# Patient Record
Sex: Female | Born: 1960 | Race: White | Hispanic: No | Marital: Married | Smoking: Never smoker
Health system: Southern US, Community
[De-identification: ages and names within clinical notes are randomized; demographics above are authoritative.]

## PROBLEM LIST (undated history)

## (undated) ENCOUNTER — Emergency Department (HOSPITAL_COMMUNITY): Payer: BLUE CROSS/BLUE SHIELD

## (undated) DIAGNOSIS — Z87442 Personal history of urinary calculi: Secondary | ICD-10-CM

## (undated) DIAGNOSIS — K7581 Nonalcoholic steatohepatitis (NASH): Secondary | ICD-10-CM

## (undated) DIAGNOSIS — G620 Drug-induced polyneuropathy: Secondary | ICD-10-CM

## (undated) DIAGNOSIS — Z800 Family history of malignant neoplasm of digestive organs: Secondary | ICD-10-CM

## (undated) DIAGNOSIS — Z1501 Genetic susceptibility to malignant neoplasm of breast: Secondary | ICD-10-CM

## (undated) DIAGNOSIS — G20C Parkinsonism, unspecified: Secondary | ICD-10-CM

## (undated) DIAGNOSIS — T451X5A Adverse effect of antineoplastic and immunosuppressive drugs, initial encounter: Secondary | ICD-10-CM

## (undated) DIAGNOSIS — I7300 Raynaud's syndrome without gangrene: Secondary | ICD-10-CM

## (undated) DIAGNOSIS — R011 Cardiac murmur, unspecified: Secondary | ICD-10-CM

## (undated) DIAGNOSIS — Z803 Family history of malignant neoplasm of breast: Secondary | ICD-10-CM

## (undated) DIAGNOSIS — C50919 Malignant neoplasm of unspecified site of unspecified female breast: Secondary | ICD-10-CM

## (undated) DIAGNOSIS — Z9889 Other specified postprocedural states: Secondary | ICD-10-CM

## (undated) DIAGNOSIS — K5090 Crohn's disease, unspecified, without complications: Secondary | ICD-10-CM

## (undated) DIAGNOSIS — E782 Mixed hyperlipidemia: Secondary | ICD-10-CM

## (undated) DIAGNOSIS — C4490 Unspecified malignant neoplasm of skin, unspecified: Secondary | ICD-10-CM

## (undated) DIAGNOSIS — Z923 Personal history of irradiation: Secondary | ICD-10-CM

## (undated) DIAGNOSIS — Z1509 Genetic susceptibility to other malignant neoplasm: Secondary | ICD-10-CM

## (undated) DIAGNOSIS — Z9221 Personal history of antineoplastic chemotherapy: Secondary | ICD-10-CM

## (undated) DIAGNOSIS — I10 Essential (primary) hypertension: Secondary | ICD-10-CM

## (undated) DIAGNOSIS — Z8042 Family history of malignant neoplasm of prostate: Secondary | ICD-10-CM

## (undated) DIAGNOSIS — C50911 Malignant neoplasm of unspecified site of right female breast: Secondary | ICD-10-CM

## (undated) DIAGNOSIS — Z8719 Personal history of other diseases of the digestive system: Secondary | ICD-10-CM

## (undated) HISTORY — DX: Family history of malignant neoplasm of breast: Z80.3

## (undated) HISTORY — DX: Family history of malignant neoplasm of digestive organs: Z80.0

## (undated) HISTORY — DX: Family history of malignant neoplasm of prostate: Z80.42

## (undated) HISTORY — PX: ABDOMINAL HYSTERECTOMY: SHX81

## (undated) HISTORY — DX: Malignant neoplasm of unspecified site of unspecified female breast: C50.919

## (undated) HISTORY — PX: OTHER SURGICAL HISTORY: SHX169

## (undated) HISTORY — DX: Malignant neoplasm of unspecified site of right female breast: C50.911

## (undated) HISTORY — PX: TONSILLECTOMY: SUR1361

## (undated) HISTORY — DX: Personal history of other diseases of the digestive system: Z87.19

## (undated) HISTORY — DX: Essential (primary) hypertension: I10

## (undated) NOTE — *Deleted (*Deleted)
Patient on schedule for Liver Biopsy 11/05/2019. Spoke with patient on phone. Made aware to be here @ 0930, NPO after MN.and to have driver for discharge post procedure.

---
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
## 2011-01-11 DIAGNOSIS — Z9221 Personal history of antineoplastic chemotherapy: Secondary | ICD-10-CM

## 2011-01-11 DIAGNOSIS — Z923 Personal history of irradiation: Secondary | ICD-10-CM

## 2011-01-11 HISTORY — DX: Personal history of irradiation: Z92.3

## 2011-01-11 HISTORY — DX: Personal history of antineoplastic chemotherapy: Z92.21

## 2011-01-11 HISTORY — PX: BREAST BIOPSY: SHX20

## 2011-01-11 HISTORY — PX: BREAST LUMPECTOMY: SHX2

## 2011-06-08 DIAGNOSIS — C50911 Malignant neoplasm of unspecified site of right female breast: Secondary | ICD-10-CM

## 2011-06-08 HISTORY — DX: Malignant neoplasm of unspecified site of right female breast: C50.911

## 2013-02-10 HISTORY — PX: REDUCTION MAMMAPLASTY: SUR839

## 2014-08-27 DIAGNOSIS — I10 Essential (primary) hypertension: Secondary | ICD-10-CM | POA: Insufficient documentation

## 2014-08-27 DIAGNOSIS — R42 Dizziness and giddiness: Secondary | ICD-10-CM | POA: Insufficient documentation

## 2015-07-15 ENCOUNTER — Encounter: Payer: Self-pay | Admitting: *Deleted

## 2015-07-15 ENCOUNTER — Other Ambulatory Visit: Payer: Self-pay | Admitting: Obstetrics & Gynecology

## 2015-07-15 DIAGNOSIS — Z853 Personal history of malignant neoplasm of breast: Secondary | ICD-10-CM

## 2015-07-21 ENCOUNTER — Inpatient Hospital Stay
Admission: RE | Admit: 2015-07-21 | Discharge: 2015-07-21 | Disposition: A | Discharge status: Home / Self Care | Payer: Self-pay | Source: Ambulatory Visit | Attending: *Deleted | Admitting: *Deleted

## 2015-07-21 ENCOUNTER — Other Ambulatory Visit: Payer: Self-pay | Admitting: *Deleted

## 2015-07-21 DIAGNOSIS — Z9289 Personal history of other medical treatment: Secondary | ICD-10-CM

## 2015-07-23 ENCOUNTER — Other Ambulatory Visit: Payer: Self-pay | Admitting: Obstetrics & Gynecology

## 2015-07-23 DIAGNOSIS — Z853 Personal history of malignant neoplasm of breast: Secondary | ICD-10-CM

## 2015-07-27 ENCOUNTER — Ambulatory Visit: Payer: Self-pay | Admitting: General Surgery

## 2015-08-03 ENCOUNTER — Ambulatory Visit: Payer: BLUE CROSS/BLUE SHIELD

## 2015-08-03 ENCOUNTER — Ambulatory Visit
Admission: RE | Admit: 2015-08-03 | Discharge: 2015-08-03 | Disposition: A | Discharge status: Home / Self Care | Payer: BLUE CROSS/BLUE SHIELD | Source: Ambulatory Visit | Attending: Obstetrics & Gynecology | Admitting: Obstetrics & Gynecology

## 2015-08-03 DIAGNOSIS — Z853 Personal history of malignant neoplasm of breast: Secondary | ICD-10-CM | POA: Insufficient documentation

## 2015-08-04 ENCOUNTER — Ambulatory Visit: Payer: Self-pay | Admitting: General Surgery

## 2015-08-10 ENCOUNTER — Other Ambulatory Visit: Payer: Self-pay | Admitting: Hematology and Oncology

## 2015-08-10 NOTE — Progress Notes (Signed)
[facility] day:  08/11/15  Chief Complaint: Kelly Munoz is a 55 y.o. female with stage IIB Her2/neu + right breast cancer who is referred by Dr. Aneta Mins for new patient assessment.  HPI:  The patient felt a lump in her right breast on 05/03/2011.  Mammogram on 05/11/2011 at [facility] in [place], [place], revealed irregular masses in the right breast and a 1.4 cm thickened right axillary lymph node.    On 06/08/2011, she underwent right partial mastectomy and sentinel lymph node biopsy.  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  There was solid, cribriform and comedo DCIS with necrosis.  Three sentinel lymph nodes were collected.  One of 5 lymph nodes were positive.  There was a macro-metastasis in one intramammary lymph node.  Tumor was ER positive (88.8%), PR positive (90.93%), and Her2/neu 3+.  Ki-67 was 48%.   Pathologic stage was T2N1aM0.  The superior margin was positive necessitating re-excision on a second surgery.  The patient received 6 cycles of TCH chemotherapy beginning 07/11/2011.  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  She completed 1 year of adjuvant Herceptin given every 3 weeks.  At one point, her EF decreased (no records available).  She was followed by a cardiology oncologist.  EF returned to normal per patient report.  Last echo was in 2014.  She received radiation.  Radiation completed before the end of 2013.  She began tamoxifen in 01/2012.  Labs from 04/08/2014 revealed a premenopausal status with estradiol < 5 and FSH of 16.5.  CEA was 1.7 on 07/27/2015.  CA125 was 16 on 07/27/2015.  CA27.29 was 13.2 on 03/04/2015.  CBC with diff and CMP on 07/13/2015 were normal.  Mammogram on 08/03/2015 revealed stable post lumpectomy changes in the right breast.  She has not undergone genetic testing secondary to insurance coverage.  Symptomatically,  she notes some hot flashes.  She describes chemo brain.  She has some word findings issues at times.  She continues to have a neuropathy in her legs.   Past Medical History:  Diagnosis Date  . Breast cancer ([place])   . History of IBS   . Hypertension     Past Surgical History:  Procedure Laterality Date  . BREAST BIOPSY Right 2013   lumpectomy chem and rad  . parotid stone      Removal  . REDUCTION MAMMAPLASTY Bilateral 02/2013    Family History  Problem Relation Age of Onset  . Breast cancer Mother 74  . Kidney cancer Father   . Prostate cancer Maternal Uncle   . Stomach cancer Paternal Aunt     Social History:  reports that she has never smoked. She has never used smokeless tobacco. She reports that she does not drink alcohol or use drugs.  She is from [place].  She moved to [place] at the end of 03/2015 to be with her family.  Her parents live in [place].  She lives with her husband.  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  She has 2 daughters (one in graduate school, one in [place]).  The patient is alone today.  Allergies:  Allergies  Allergen Reactions  . Zithromax [Azithromycin] Diarrhea  . Clindamycin/Lincomycin Rash  . Erythromycin Rash    Current Medications: Current Outpatient Prescriptions  Medication Sig Dispense Refill  . cholecalciferol (VITAMIN D)  1000 units tablet Take 2,000 Units by mouth daily.    Marland Kitchen LIALDA 1.2 g EC tablet     . pravastatin (PRAVACHOL) 20 MG tablet     . ramipril (ALTACE) 10 MG capsule     . tamoxifen (NOLVADEX) 20 MG tablet      No current facility-administered medications for this visit.     Review of Systems:  GENERAL:  Feels good.  Active.  No fevers, sweats or weight loss.  Weight gain. PERFORMANCE STATUS (ECOG):  1 HEENT:  No visual changes, runny nose, sore throat, mouth sores or tenderness. Lungs: No shortness of breath or cough.  No hemoptysis. Cardiac:  No  chest pain, palpitations, orthopnea, or PND. GI:  Irritable bowel.  No nausea, vomiting, diarrhea, constipation, melena or hematochezia. GU:  No urgency, frequency, dysuria, or hematuria. Musculoskeletal:  No back pain.  No joint pain.  No muscle tenderness. Extremities:  No pain or swelling. Skin:  No rashes or skin changes. Neuro:  Chemo brain.  Neuropathy in legs.  No headache, numbness or weakness, balance or coordination issues. Endocrine:  No diabetes, thyroid issues, hot flashes or night sweats. Psych:  No mood changes, depression or anxiety. Pain:  No focal pain. Review of systems:  All other systems reviewed and found to be negative.  Physical Exam: Blood pressure 127/89, pulse (!) 101, temperature 98.1 F (36.7 C), temperature source Tympanic, height 5' 5"  (1.651 m), weight 153 lb 8.8 oz (69.7 kg), SpO2 98 %. GENERAL:  Well developed, well nourished, sitting comfortably in the exam room in no acute distress. MENTAL STATUS:  Alert and oriented to person, place and time. HEAD:  Long wavey brown hair.  Normocephalic, atraumatic, face symmetric, no Cushingoid features. EYES: Blue eyes.  Pupils equal round and reactive to light and accomodation.  No conjunctivitis or scleral icterus. ENT:  Oropharynx clear without lesion.  Tongue normal. Mucous membranes moist.  RESPIRATORY:  Clear to auscultation without rales, wheezes or rhonchi. CARDIOVASCULAR:  Regular rate and rhythm without murmur, rub or gallop. BREAST:  Right breast with inferior scarring s/p surgery and radiation.  No discrete masses, skin changes or nipple discharge.  Left breast medial scarring s/p breast reduction without masses, skin changes or nipple discharge.  ABDOMEN:  Soft, non-tender, with active bowel sounds, and no hepatosplenomegaly.  No masses. SKIN:  No rashes, ulcers or lesions. EXTREMITIES: No edema, no skin discoloration or tenderness.  No palpable cords. LYMPH NODES: No palpable cervical, supraclavicular,  axillary or inguinal adenopathy  NEUROLOGICAL: Unremarkable. PSYCH:  Appropriate.   No visits with results within 3 Day(s) from this visit.  Latest known visit with results is:  No results found for any previous visit.    Assessment:  Kelly Munoz is a 54 y.o. female with stage IIB Her2/neu + right breast cancer s/p right partial mastectomy and sentinel lymph node biopsy on 06/08/2011.  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  There was solid, cribriform and comedo DCIS with necrosis.  One of 5 lymph nodes were positive.  There was a macro-metastasis in one intramammary lymph node.  Tumor was ER positive (88.8%), PR positive (90.93%), and Her2/neu 3+.  Ki-67 was 48%.   Pathologic stage was T2N1aM0.   She receievd 6 cycles of TCH chemotherapy beginning 07/11/2011.  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  She completed 1 year of adjuvant Herceptin.  At one point, her EF decreased (no records available).  EF returned to  normal per patient report (last echo was in 2014).  She received radiation (completed before the end of 2013).  She began tamoxifen in 01/2012.  CA27.29 was 13.2 on 03/04/2015.  Mammogram on 08/03/2015 revealed stable post lumpectomy changes in the right breast.    Symptomatically, she notes some hot flashes.  She continues to have a mild neuropathy in her legs.  Plan: 1.  Discuss entire medical history, diagnosis and management of breast cancer.  Discuss recent mammogram.  Discuss last labs (no CA27.29).  Discuss consideration of BCI testing for extended adjuvant hormonal therapy.     2.  Discuss prior cardiac issues and Herceptin.  Discuss follow-up with cardiology. 3.  ROI for additional medical records. 4.  Assist with patient finding a primary care physician [facility]).  Patient would also like to see cardiology, GI, and gynecology. 5.  [facility] slip for CA27.29. 6.  [facility] slip in 6 months:  CBC with  diff, CMP, CA27.29. 7.  Continue tamoxifen. 8.  RTC in 6 months for MD assess, review of [place] labs.   Lequita Asal, MD  08/11/2015, 12:07 PM

## 2015-08-11 ENCOUNTER — Encounter (INDEPENDENT_AMBULATORY_CARE_PROVIDER_SITE_OTHER): Payer: Self-pay

## 2015-08-11 ENCOUNTER — Encounter: Payer: Self-pay | Admitting: Hematology and Oncology

## 2015-08-11 ENCOUNTER — Inpatient Hospital Stay: Payer: BLUE CROSS/BLUE SHIELD | Attending: Hematology and Oncology | Admitting: Hematology and Oncology

## 2015-08-11 DIAGNOSIS — Z170 Estrogen receptor positive status [ER+]: Secondary | ICD-10-CM | POA: Diagnosis not present

## 2015-08-11 DIAGNOSIS — Z79899 Other long term (current) drug therapy: Secondary | ICD-10-CM | POA: Insufficient documentation

## 2015-08-11 DIAGNOSIS — G629 Polyneuropathy, unspecified: Secondary | ICD-10-CM | POA: Insufficient documentation

## 2015-08-11 DIAGNOSIS — Z923 Personal history of irradiation: Secondary | ICD-10-CM | POA: Insufficient documentation

## 2015-08-11 DIAGNOSIS — C50911 Malignant neoplasm of unspecified site of right female breast: Secondary | ICD-10-CM | POA: Diagnosis not present

## 2015-08-11 DIAGNOSIS — K589 Irritable bowel syndrome without diarrhea: Secondary | ICD-10-CM | POA: Diagnosis not present

## 2015-08-11 DIAGNOSIS — Z9221 Personal history of antineoplastic chemotherapy: Secondary | ICD-10-CM | POA: Diagnosis not present

## 2015-08-11 DIAGNOSIS — Z9011 Acquired absence of right breast and nipple: Secondary | ICD-10-CM | POA: Insufficient documentation

## 2015-08-11 DIAGNOSIS — I10 Essential (primary) hypertension: Secondary | ICD-10-CM | POA: Insufficient documentation

## 2015-08-11 NOTE — Progress Notes (Signed)
Patient here for follow up she is a referral from [place]. Breast [place]. No compalints.

## 2015-08-18 ENCOUNTER — Encounter: Payer: Self-pay | Admitting: Hematology and Oncology

## 2015-09-07 ENCOUNTER — Inpatient Hospital Stay (HOSPITAL_BASED_OUTPATIENT_CLINIC_OR_DEPARTMENT_OTHER): Payer: BLUE CROSS/BLUE SHIELD | Admitting: Hematology and Oncology

## 2015-09-07 ENCOUNTER — Encounter: Payer: Self-pay | Admitting: Hematology and Oncology

## 2015-09-07 VITALS — BP 119/79 | HR 103 | Temp 96.7°F | Resp 18 | Wt 154.2 lb

## 2015-09-07 DIAGNOSIS — G629 Polyneuropathy, unspecified: Secondary | ICD-10-CM

## 2015-09-07 DIAGNOSIS — Z9011 Acquired absence of right breast and nipple: Secondary | ICD-10-CM | POA: Diagnosis not present

## 2015-09-07 DIAGNOSIS — Z923 Personal history of irradiation: Secondary | ICD-10-CM

## 2015-09-07 DIAGNOSIS — Z79899 Other long term (current) drug therapy: Secondary | ICD-10-CM

## 2015-09-07 DIAGNOSIS — C50911 Malignant neoplasm of unspecified site of right female breast: Secondary | ICD-10-CM

## 2015-09-07 DIAGNOSIS — Z1501 Genetic susceptibility to malignant neoplasm of breast: Secondary | ICD-10-CM

## 2015-09-07 DIAGNOSIS — Z170 Estrogen receptor positive status [ER+]: Secondary | ICD-10-CM | POA: Diagnosis not present

## 2015-09-07 DIAGNOSIS — Z9221 Personal history of antineoplastic chemotherapy: Secondary | ICD-10-CM

## 2015-09-07 NOTE — Progress Notes (Signed)
[place] [facility] day:  09/07/15  Chief Complaint: Kelly Munoz is a 55 y.o. female with stage IIB Her2/neu + right breast cancer who is seen to discuss genetic testing.  HPI:  The patient was last seen in medical oncology clinic on 08/11/2015. At that time she was seen for initial assessment.  She had been diagnosed in 2013.  She had  moved from [place] to [place].  She completed TCH chemotherapy and a year of adjuvant Herceptin.  She was on tamoxifen.  Symptomatically, she noted some hot flashes.  Bilateral mammogram on 08/03/2015 revealed post-lumpectomy changes in the right breast.  CA27.29 was 21.2 on 08/14/2015.  My Risk Genetic testing was ordered by Dr. Vikki Ports Ward on 07/13/2015. 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  The patient's mother had breast cancer at age 8. Patient's father had a nose of carcinoma at age 78.  Two paternal first cousins had colon cancer (age 22 and 41).  Symptomatically, she denies any new complaint.   Past Medical History:  Diagnosis Date  . Breast cancer ([place])   . Breast cancer, right ([place]) 06/08/2011  . History of IBS   . Hypertension     Past Surgical History:  Procedure Laterality Date  . BREAST BIOPSY Right 2013   lumpectomy chem and rad  . parotid stone      Removal  . REDUCTION MAMMAPLASTY Bilateral 02/2013    Family History  Problem Relation Age of Onset  . Breast cancer Mother 50  . Kidney cancer Father   . Prostate cancer Maternal Uncle   . Stomach cancer Paternal Aunt     Social History:  reports that she has never smoked. She has never used smokeless tobacco. She reports that she does not drink alcohol or use drugs.  She is from [place].  She moved to [place] at the  end of 03/2015 to be with her family.  Her parents live in [place].  She lives with her husband.  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  She has 2 daughters (8 year-old in graduate school, 54 year-old in [place]).  Her husband is currently in [place] s/o MI.  The patient is alone today.  Allergies:  Allergies  Allergen Reactions  . Zithromax [Azithromycin] Diarrhea  . Clindamycin/Lincomycin Rash  . Erythromycin Rash    Current Medications: Current Outpatient Prescriptions  Medication Sig Dispense Refill  . cholecalciferol (VITAMIN D) 1000 units tablet Take 5,000 Units by mouth daily.     Marland Kitchen LIALDA 1.2 g EC tablet Take 1.2 g by mouth daily with breakfast.     . pravastatin (PRAVACHOL) 20 MG tablet Take 20 mg by mouth daily.     . ramipril (ALTACE) 10 MG capsule Take 20 mg by mouth daily.     . tamoxifen (NOLVADEX) 20 MG tablet Take 20 mg by mouth daily.      No current facility-administered medications for this visit.     Review of Systems:  GENERAL:  Feels good.  No fevers, sweats or weight loss.  Weight gain of 1 pound. PERFORMANCE STATUS (ECOG):  1 HEENT:  No visual changes, runny nose, sore throat, mouth sores or tenderness. Lungs: No shortness of breath or  cough.  No hemoptysis. Cardiac:  No chest pain, palpitations, orthopnea, or PND. GI:  Irritable bowel.  No nausea, vomiting, diarrhea, constipation, melena or hematochezia. GU:  No urgency, frequency, dysuria, or hematuria. Musculoskeletal:  No back pain.  No joint pain.  No muscle tenderness. Extremities:  No pain or swelling. Skin:  No rashes or skin changes. Neuro:  Chemo brain.  Neuropathy in legs.  No headache, numbness or weakness, balance or coordination issues. Endocrine:  No diabetes, thyroid issues, hot flashes or night sweats. Psych:  No mood changes, depression or anxiety. Pain:  No focal pain. Review of systems:  All other systems reviewed and found to be  negative.  Physical Exam: Blood pressure 119/79, pulse (!) 103, temperature (!) 96.7 F (35.9 C), temperature source Tympanic, resp. rate 18, weight 154 lb 3.4 oz (70 kg), SpO2 97 %. GENERAL:  Well developed, well nourished, woman sitting comfortably in the exam room in no acute distress. MENTAL STATUS:  Alert and oriented to person, place and time. HEAD:  Long wavey brown hair.  Normocephalic, atraumatic, face symmetric, no Cushingoid features. EYES: Blue eyes.  No conjunctivitis or scleral icterus. NEUROLOGICAL: Unremarkable. PSYCH:  Appropriate.   No visits with results within 3 Day(s) from this visit.  Latest known visit with results is:  No results found for any previous visit.    Assessment:  Kelly Munoz is a 55 y.o. female with stage IIB Her2/neu + right breast cancer s/p right partial mastectomy and sentinel lymph node biopsy on 06/08/2011.  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  There was solid, cribriform and comedo DCIS with necrosis.  One of 5 lymph nodes were positive.  There was a macro-metastasis in one intramammary lymph node.  Tumor was ER positive (88.8%), PR positive (90.93%), and Her2/neu 3+.  Ki-67 was 48%.   Pathologic stage was T2N1aM0.   My Risk Genetic testing on 07/13/2015 revealed a BRCA2 mutation (c.5946del). 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  She receievd 6 cycles of TCH chemotherapy beginning 07/11/2011.  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  She completed 1 year of adjuvant Herceptin.  At one point, her EF decreased (no records available).  EF returned to normal per patient report (last echo was in 2014).  She received radiation (completed before the end of 2013).  She began tamoxifen in 01/2012.  CA27.29 was 13.2 on 03/04/2015 and 21.2 on 08/14/2015.  Bilateral mammogram on 08/03/2015 revealed post-lumpectomy changes in the right  breast.  Symptomatically, she notes some hot flashes.  She has a mild neuropathy in her legs.  Plan: 1.  Review MyRisk genetic testing.   MyRisk management tool notes a risk of 12% for a second breast cancer within 5 years of first breast cancer diagnosis. Risk of ovarian cancer to age 82 is 16.5-27% (6.8% within 10 years of breast cancer diagnosis). Pancreatic cancer risk is 7% to age 61.  Melanoma risk is elevated.  We discussed her current management of breast cancer. She is on tamoxifen. This will be continued.  We discussed 5 years verus 10 years versus indefinite hormonal therapy.  Given her high risk tumor, I suspect she will require at least 10 years of hormonal therapy.  We had previously discussed BCI testing.  Risk reduction mastectomy was briefly discussed.  She is s/p partial mastectomy followed by radiation.  Ideally, testing would have been done (approved) prior to her original surgery.  She will be presented at tumor board.  She is considering bilateral salpingo-oophorectomy.  I discussed referral to our gyncecolist-oncologist for further discussion regarding surveillance (exam, transvaginal ultrasound, CA125) and surgery.    We discussed checking a CA125 and CA19-9 (pancreatic cancer) at her next lab draw.  She may be a candidate for a research protocol which screens high risk patients for pancreatic cancer at [facility].  2.  Consulting civil engineer in [place]. 3.  Consult Dr. Theora Gianotti- oophorectomy and BRCA2 surveillance 4.  [facility] slip for CA125 and CA19-9. 5.  RTC as previously scheduled   Lequita Asal, MD  09/07/2015, 9:09 AM

## 2015-09-07 NOTE — Progress Notes (Signed)
Patient is here for follow up, She has some genetic testing done at [facility]. Patient has a regular fast heart rate. She has seen cardiology already and everything is fine. This is normal for her.

## 2015-09-09 ENCOUNTER — Other Ambulatory Visit: Payer: Self-pay | Admitting: *Deleted

## 2015-09-09 ENCOUNTER — Encounter: Payer: Self-pay | Admitting: *Deleted

## 2015-09-09 DIAGNOSIS — Z1501 Genetic susceptibility to malignant neoplasm of breast: Secondary | ICD-10-CM

## 2015-09-09 DIAGNOSIS — C50911 Malignant neoplasm of unspecified site of right female breast: Secondary | ICD-10-CM

## 2015-09-09 NOTE — Progress Notes (Signed)
Pt requested a genetic referral and I emailed Roma Kayser and she emailed me back and said to enter a referral and [place] office will call her with appt.  I entered the referral today and called [facility] and checked to make sure they can see the referral and it went through correctly and it did and they will make the appt and call the pt

## 2015-09-24 ENCOUNTER — Encounter: Payer: Self-pay | Admitting: *Deleted

## 2015-09-29 ENCOUNTER — Telehealth: Payer: Self-pay | Admitting: Genetic Counselor

## 2015-09-29 ENCOUNTER — Encounter: Payer: Self-pay | Admitting: Genetic Counselor

## 2015-09-29 NOTE — Telephone Encounter (Signed)
Pt had a number of questions and requested to speak with genetic counslor before scheduling an appointment.

## 2015-09-30 ENCOUNTER — Encounter: Payer: Self-pay | Admitting: Obstetrics and Gynecology

## 2015-09-30 ENCOUNTER — Inpatient Hospital Stay: Payer: BLUE CROSS/BLUE SHIELD | Attending: Hematology and Oncology | Admitting: Obstetrics and Gynecology

## 2015-09-30 VITALS — BP 138/89 | HR 108 | Temp 98.0°F | Ht 62.5 in | Wt 153.4 lb

## 2015-09-30 DIAGNOSIS — Z853 Personal history of malignant neoplasm of breast: Secondary | ICD-10-CM

## 2015-09-30 DIAGNOSIS — I10 Essential (primary) hypertension: Secondary | ICD-10-CM

## 2015-09-30 DIAGNOSIS — Z923 Personal history of irradiation: Secondary | ICD-10-CM | POA: Insufficient documentation

## 2015-09-30 DIAGNOSIS — K589 Irritable bowel syndrome without diarrhea: Secondary | ICD-10-CM | POA: Diagnosis not present

## 2015-09-30 DIAGNOSIS — Z1501 Genetic susceptibility to malignant neoplasm of breast: Secondary | ICD-10-CM | POA: Insufficient documentation

## 2015-09-30 DIAGNOSIS — Z148 Genetic carrier of other disease: Secondary | ICD-10-CM | POA: Diagnosis not present

## 2015-09-30 DIAGNOSIS — Z8051 Family history of malignant neoplasm of kidney: Secondary | ICD-10-CM

## 2015-09-30 DIAGNOSIS — Z1509 Genetic susceptibility to other malignant neoplasm: Secondary | ICD-10-CM

## 2015-09-30 DIAGNOSIS — Z9221 Personal history of antineoplastic chemotherapy: Secondary | ICD-10-CM

## 2015-09-30 DIAGNOSIS — Z800 Family history of malignant neoplasm of digestive organs: Secondary | ICD-10-CM

## 2015-09-30 DIAGNOSIS — C50911 Malignant neoplasm of unspecified site of right female breast: Secondary | ICD-10-CM

## 2015-09-30 DIAGNOSIS — Z8042 Family history of malignant neoplasm of prostate: Secondary | ICD-10-CM | POA: Diagnosis not present

## 2015-09-30 DIAGNOSIS — Z803 Family history of malignant neoplasm of breast: Secondary | ICD-10-CM

## 2015-09-30 DIAGNOSIS — K5090 Crohn's disease, unspecified, without complications: Secondary | ICD-10-CM | POA: Insufficient documentation

## 2015-09-30 DIAGNOSIS — Z79899 Other long term (current) drug therapy: Secondary | ICD-10-CM

## 2015-09-30 DIAGNOSIS — K529 Noninfective gastroenteritis and colitis, unspecified: Secondary | ICD-10-CM | POA: Insufficient documentation

## 2015-09-30 DIAGNOSIS — Z79810 Long term (current) use of selective estrogen receptor modulators (SERMs): Secondary | ICD-10-CM | POA: Diagnosis not present

## 2015-09-30 DIAGNOSIS — E782 Mixed hyperlipidemia: Secondary | ICD-10-CM | POA: Insufficient documentation

## 2015-09-30 NOTE — Patient Instructions (Addendum)
Kelly Munoz [phone]  Surgical risks include infection, anesthesia, bleeding, transfusion, wound separation, vaginal cuff dehiscence, medical issues (blood clots, stroke, heart attack, fluid in the lungs, pneumonia, abnormal heart rhythm, death), possible exploratory surgery with larger incision, lymphedema, lymphocyst, allergic reaction, injury to adjacent organs (bowel, bladder, blood vessels, nerves, ureters, uterus).    Bilateral Salpingo-Oophorectomy Bilateral salpingo-oophorectomy is the surgical removal of both fallopian tubes and both ovaries. The ovaries are small organs that produce eggs in women. The fallopian tubes transport the egg from the ovary to the womb (uterus). Usually, when this surgery is done, the uterus was previously removed. A bilateral salpingo-oophorectomy may be done to treat cancer or to reduce the risk of cancer in women who are at high risk. Removing both fallopian tubes and both ovaries will make you unable to become pregnant (sterile). It will also put you into menopause so that you will no longer have menstrual periods and may have menopausal symptoms such as hot flashes, night sweats, and mood changes. It will not affect your sex drive. LET [facility] CARE PROVIDER KNOW ABOUT:  Any allergies you have.  All medicines you are taking, including vitamins, herbs, eye drops, creams, and over-the-counter medicines.  Previous problems you or members of your family have had with the use of anesthetics.  Any blood disorders you have.  Previous surgeries you have had.  Medical conditions you have. RISKS AND COMPLICATIONS Generally, this is a safe procedure. However, as with any procedure, complications can occur. Possible complications include:  Injury to surrounding organs.  Bleeding.  Infection.  Blood clots in the legs or lungs.  Problems related to anesthesia. BEFORE THE PROCEDURE  Ask your health care provider about changing or stopping your  regular medicines. You may need to stop taking certain medicines, such as aspirin or blood thinners, at least 1 week before the surgery.  Do not eat or drink anything for at least 8 hours before the surgery.  If you smoke, do not smoke for at least 2 weeks before the surgery.  Make plans to have someone drive you home after the procedure or after your hospital stay. Also arrange for someone to help you with activities during recovery. PROCEDURE   You will be given medicine to help you relax before the procedure (sedative). You will then be given medicine to make you sleep through the procedure (general anesthetic). These medicines will be given through an IV access tube that is put into one of your veins.  Once you are asleep, your lower abdomen will be shaved and cleaned. A thin, flexible tube (catheter) will be placed in your bladder.  The surgeon may use a laparoscopic, robotic, or open technique for this surgery:  In the laparoscopic technique, the surgery is done through two small cuts (incisions) in the abdomen. A thin, lighted tube with a tiny camera on the end (laparoscope) is inserted into one of the incisions. The tools needed for the procedure are put through the other incision.  A robotic technique may be chosen to perform complex surgery in a small space. In the robotic technique, small incisions will be made. A camera and surgical instruments are passed through the incisions. Surgical instruments will be controlled with the help of a robotic arm.  In the open technique, the surgery is done through one large incision in the abdomen.  Using any of these techniques, the surgeon removes the fallopian tubes and ovaries. The blood vessels will be clamped and tied.  The surgeon  then uses staples or stitches to close the incision or incisions. AFTER THE PROCEDURE  You will be taken to a recovery area where you will be monitored for 1 to 3 hours. Your blood pressure, pulse, and  temperature will be checked often. You will remain in the recovery area until you are stable and waking up.  If the laparoscopic technique was used, you may be allowed to go home after several hours. You may have some shoulder pain after the laparoscopic procedure. This is normal and usually goes away in a day or two.  If the open technique was used, you will be admitted to the hospital for a couple of days.  You will be given pain medicine as needed.  The IV access tube and catheter will be removed before you are discharged.   This information is not intended to replace advice given to you by your health care provider. Make sure you discuss any questions you have with your health care provider.   Document Released: 12/27/2004 Document Revised: 01/01/2013 Document Reviewed: 06/20/2012 Elsevier Interactive Patient Education 2016 [place].  Abdominal Hysterectomy Abdominal hysterectomy is a surgical procedure to remove your womb (uterus). Your uterus is the muscular organ that contains a developing baby. This surgery is done for many reasons. You may need an abdominal hysterectomy if you have cancer, growths (tumors), long-term pain, or bleeding. You may also have this procedure if your uterus has slipped down into your vagina (uterine prolapse). Depending on why you need an abdominal hysterectomy, you may also have other reproductive organs removed. These could include the part of your vagina that connects with your uterus (cervix), the organs that make eggs (ovaries), and the tubes that connect the ovaries to the uterus (fallopian tubes). LET [facility] CARE PROVIDER KNOW ABOUT:   Any allergies you have.  All medicines you are taking, including vitamins, herbs, eye drops, creams, and over-the-counter medicines.  Previous problems you or members of your family have had with the use of anesthetics.  Any blood disorders you have.  Previous surgeries you have had.  Medical conditions  you have. RISKS AND COMPLICATIONS Generally, this is a safe procedure. However, as with any procedure, problems can occur. Infection is the most common problem after an abdominal hysterectomy. Other possible problems include:  Bleeding.  Formation of blood clots that may break free and travel to your lungs.  Injury to other organs near your uterus.  Nerve injury causing nerve pain.  Decreased interest in sex or pain during sexual intercourse. BEFORE THE PROCEDURE  Abdominal hysterectomy is a major surgical procedure. It can affect the way you feel about yourself. Talk to your health care provider about the physical and emotional changes hysterectomy may cause.  You may need to have blood work and X-rays done before surgery.  Quit smoking if you smoke. Ask your health care provider for help if you are struggling to quit.  Stop taking medicines that thin your blood as directed by your health care provider.  You may be instructed to take antibiotic medicines or laxatives before surgery.  Do not eat or drink anything for 6-8 hours before surgery.  Take your regular medicines with a small sip of water.  Bathe or shower the night or morning before surgery. PROCEDURE  Abdominal hysterectomy is done in the operating room at the hospital.  In most cases, you will be given a medicine that makes you go to sleep (general anesthetic).  The surgeon will make a cut (  incision) through the skin in your lower belly.  The incision may be about 5-7 inches long. It may go side-to-side or up-and-down.  The surgeon will move aside the body tissue that covers your uterus. The surgeon will then carefully take out your uterus along with any of your other reproductive organs that need to be removed.  Bleeding will be controlled with clamps or sutures.  The surgeon will close your incision with sutures or metal clips. AFTER THE PROCEDURE  You will have some pain immediately after the  procedure.  You will be given pain medicine in the recovery room.  You will be taken to your hospital room when you have recovered from the anesthesia.  You may need to stay in the hospital for 2-5 days.  You will be given instructions for recovery at home.   This information is not intended to replace advice given to you by your health care provider. Make sure you discuss any questions you have with your health care provider.   Document Released: 01/01/2013 Document Reviewed: 01/01/2013 Elsevier Interactive Patient Education [employer].   Women in the [place] have a 2.8 percent lifetime risk of being diagnosed with uterine cancer [3].    From UpToDate Endometrial cancer - In an updated report of the P-1 study with seven years of follow-up, 36 cases of endometrial cancer occurred in the tamoxifen group versus 17 in the placebo group (risk ratio 3.28) (figure 1) [21]. In addition, there were four cases of uterine sarcoma, three in the tamoxifen group. Of the 70 total endometrial cancers, 67 were localized (stage I). The majority presented with vaginal bleeding [22]. The increase in endometrial cancer was almost exclusively in women over the age of 58 years. A comparable risk ratio for endometrial cancer (2.7-fold increased risk) was found in a meta-analysis of 32 published randomized controlled trials in which a treatment arm containing tamoxifen was compared with a similar control arm [8]. The elevated risk of endometrial cancer continues as long as the patient takes tamoxifen [23,24] and decreases after treatment is discontinued [25]: ?In the Adjuvant Tamoxifen: Longer Against Shorter (ATLAS) trial, which randomly assigned over 15,000 women to a 10- versus 5-year course of tamoxifen, the cumulative risk of endometrial cancer during years 5 to 14 was 3.1 versus 1.6 percent, respectively [19]. This resulted in an increased mortality risk (0.4 versus 0.2 percent) associated with extended  tamoxifen therapy. Further results of the ATLAS trial are discussed separately. (See "Adjuvant endocrine therapy for non-metastatic, hormone receptor-positive breast cancer", section on 'Duration of endocrine treatment'.) ?A case-control study compared 816 patients with breast cancer who subsequently developed endometrial cancer, and 1067 controls with breast cancer but not endometrial cancer, both groups derived from population-based regional cancer registries in the [place] [24]. Compared with no treatment, the use of tamoxifen was associated with a significantly higher risk of endometrial cancer (odds ratio [OR] 2.4) that increased over time (ORs for 2 to 4, 5 to 7, 8 to 9, and 10 to 17 years of therapy were 2.1, 2.5, 4.8, 7.9, respectively). ?Similarly, in the most recent EBCTCG overview analysis of individual patient data from 20 trials of approximately five years of tamoxifen versus no adjuvant tamoxifen, use of tamoxifen was associated with a significant 2.4-fold increased risk of uterine cancer, but there was no suggestion of any adverse effect on mortality from uterine cancer [18]. ?A reduction in relative risk after treatment discontinuation was shown in the International Breast Cancer Study Group (IBIS)-I tamoxifen chemoprevention  trial, in which 7145 women at high risk for breast cancer were randomized to five years of tamoxifen or placebo [25]. Thirteen of the 17 endometrial cancers in the tamoxifen group were diagnosed during active treatment (versus 3 of 11 in the placebo group). In contrast, during the follow-up period, four endometrial cancers were diagnosed in the tamoxifen group compared with eight in the placebo group. Uterine sarcoma - Although early studies failed to document an increased risk of uterine sarcoma in women treated with tamoxifen [3,26,27], long-term follow-up reports have disclosed a very small increased incidence of uterine sarcoma, particularly uterine carcinosarcoma  or malignant mixed Mllerian tumors (MMMTs) [28-32]. (See "Uterine sarcoma: Classification, clinical manifestations, and diagnosis" and "Clinical features, diagnosis, staging, and treatment of uterine carcinosarcoma".) This was illustrated in data derived from the Surveillance, Epidemiology, and End Results (SEER) database of the [employer] on [phone] women diagnosed with breast cancer between 1980 and 2000 who were treated with tamoxifen [31]. Compared with the general SEER population, the relative risk of a subsequent uterine corpus cancer (observed-to-expected [O/E] ratio) was 2.17, and was substantially higher for MMMTs than for endometrial adenocarcinomas (O/E = 4.62 and 2.07, respectively). The relative risk of MMMT rose further in women surviving five years, while the risk of adenocarcinoma did not change appreciably (O/E 8.0 and 2.3, respectively). However, because of the rarity of MMMTs, this translated into a smaller excess absolute risk for MMMTs than for uterine adenocarcinomas (an additional 1.4 versus 8.4 cancers per 10,0000 women per year, respectively). Prognosis was poor among women diagnosed with MMMT, with 25 deaths among the 22 diagnosed women. As a result of these findings, the [place] FDA issued a black box warning on tamoxifen because of its association with uterine sarcoma, which is intended mainly for women considering tamoxifen for ductal carcinoma in situ and as a chemopreventive agent where there is no proven survival benefit. (See "Ductal carcinoma in situ: Treatment and prognosis" and "Selective estrogen receptor modulators and aromatase inhibitors for breast cancer prevention".) This warning does not apply to women with a history of invasive breast cancer since there is a proven survival benefit for adjuvant tamoxifen in this setting. (See "Adjuvant endocrine therapy for non-metastatic, hormone receptor-positive breast cancer".)   JAMA Oncol. 2016 Nov  1;2(11):1434-1440. doi: 10.1001/jamaoncol.2016.1820.  Uterine Cancer After Risk-Reducing Salpingo-oophorectomy Without Hysterectomy in Women With BRCA Mutations. Darlen Round MC2, Jotwani AR3, [place] TM4, Soslow RA5, [address], [place], Konner JA8, [place], [place] F6, [address], [place], [place], [place], [place] ME10, [address] [place] MM Jr6, [place] NR6, Leighton Roach [address] [place] C14, Eeles RA9, [place] [address], [place] K3, [place], [place], [place].  Author information Abstract Importance:  The link between BRCA mutations and uterine cancer is unclear. Therefore, although risk-reducing salpingo-oophorectomy (RRSO) is standard treatment among women with BRCA mutations (BRCA+ women), the role of concomitant hysterectomy is controversial. Objective:  To determine the risk for uterine cancer and distribution of specific histologic subtypes in BRCA+ women after RRSO without hysterectomy. Design, Setting, and Participants:  This multicenter prospective cohort study included 1083 women with a deleterious BRCA1 or BRCA2 mutation identified from January 10, 1993, to January 09, 2010, at 9 academic medical centers in the [place] who underwent RRSO without a prior or concomitant hysterectomy. Of these, 627 participants were BRCA1+; 453, BRCA2+; and 3, both. Participants were prospectively followed up for a  median 5.1 (interquartile range [IQR], 3.0-8.4) years after ascertainment, BRCA testing, or RRSO (whichever occurred last). Follow up data available through October 23, 2012, were included in the analyses. Censoring occurred at uterine cancer diagnosis, hysterectomy, last follow-up, or death. New cancers were categorized by histologic subtype, and available tumors were analyzed for loss of the wild-type BRCA gene and/or protein expression. Main Outcomes and Measures:  Incidence  of uterine corpus cancer in BRCA+ women who underwent RRSO without hysterectomy compared with rates expected from the Surveillance, Epidemiology, and End Results database. Results:  Among the 1083 women women who underwent RRSO without hysterectomy at a median age 9.6 (IQR: 40.9 - 52.5), 8 incident uterine cancers were observed (4.3 expected; observed to expected [O:E] ratio, 1.9; 95% CI, 0.8-3.7; P?=?.09). No increased risk for endometrioid endometrial carcinoma or sarcoma was found after stratifying by subtype. Five serous and/or serous-like (serous/serous-like) endometrial carcinomas were observed (4 BRCA1+ and 1 BRCA2+) 7.2 to 12.9 years after RRSO (BRCA1: 0.18 expected [O:E ratio, 22.2; 95% CI, 6.1-56.9; P?<?.001]; BRCA2: 0.16 expected [O:E ratio, 6.4; 95% CI, 0.2-35.5; P?=?.15]). Tumor analyses confirmed loss of the wild-type BRCA1 gene and/or protein expression in all 3 available serous/serous-like BRCA1+ tumors. Conclusions and Relevance:  Although the overall risk for uterine cancer after RRSO was not increased, the risk for serous/serous-like endometrial carcinoma was increased in BRCA1+ women. This risk should be considered when discussing the advantages and risks of hysterectomy at the time of RRSO in BRCA1+ women.

## 2015-09-30 NOTE — Progress Notes (Signed)
Gynecologic Oncology Consult Visit   Referring Provider: Dr. 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 female who is seen in consultation from Dr. Mike Gip for consideration of RRSO and counseling for BRCA2 mutation carrier status.   Ms. Neilani Duffee is a pleasant female with stage IIB Her2/neu + right breast cancer diagnosed in 2013 s/p radiation, [facility] chemotherapy and a year of adjuvant Herceptin followed by tamoxifen.who is seen to discuss RRSO. Her medical oncologist is Dr. Mike Gip.   My Risk Genetic testing was ordered by Dr. Vikki Ports Ward on 07/13/2015. 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   MyRisk management tool notes a risk of 12% for a second breast cancer within 5 years of first breast cancer diagnosis. Risk of ovarian cancer to age 64 is 16.5-27% (6.8% within 10 years of breast cancer diagnosis). Pancreatic cancer risk is 7% to age 25.  Melanoma risk is elevated.  The patient's mother had breast cancer at age 32. Patient's father had a nose of carcinoma at age 42.  Two paternal first cousins had colon cancer (age 24 and 81).  Symptomatically, she denies any new complaint. Recent Pap/HPV testing negative 07/13/2015  Problem List: Patient Active Problem List   Diagnosis Date Noted  . History of breast cancer 09/30/2015  . 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 Cancer of right breast with BRCA2 gene mutation ([place]) 09/09/2015  . Breast cancer, right ([place]) 06/08/2011    Past Medical History: Past Medical History:  Diagnosis Date  . Breast cancer ([place])   . Breast cancer, right ([place]) 06/08/2011  . History of IBS   . Hypertension     Past Surgical History: Past Surgical History:  Procedure Laterality Date  . BREAST BIOPSY  Right 2013   lumpectomy chem and rad  . parotid stone      Removal  . REDUCTION MAMMAPLASTY Bilateral 02/2013    Past Gynecologic History:  See HPI  OB History: G2P2 OB History  No data available    Family History: Family History  Problem Relation Age of Onset  . Breast cancer Mother 51  . Kidney cancer Father   . Prostate cancer Maternal Uncle   . Stomach cancer Paternal Aunt     Social History: Social History   Social History  . Marital status: Married    Spouse name: N/A  . Number of children: N/A  . Years of education: N/A   Occupational History  . Not on file.   Social History Main Topics  . Smoking status: Never Smoker  . Smokeless tobacco: Never Used  . Alcohol use No  . Drug use: No  . Sexual activity: Not on file   Other Topics Concern  . Not on file   Social History Narrative  . No narrative on file    Allergies: Allergies  Allergen Reactions  . Zithromax [Azithromycin] Diarrhea  . Clindamycin/Lincomycin Rash  . Erythromycin Rash    Current Medications: Current Outpatient Prescriptions  Medication Sig Dispense Refill  . cholecalciferol (VITAMIN D) 1000 units tablet Take 5,000 Units by mouth daily.     Marland Kitchen LIALDA 1.2 g EC tablet Take 1.2 g by mouth daily with breakfast.     . pravastatin (PRAVACHOL) 20 MG tablet Take 20 mg by mouth daily.     Marland Kitchen  ramipril (ALTACE) 10 MG capsule Take 20 mg by mouth daily.     . tamoxifen (NOLVADEX) 20 MG tablet Take 20 mg by mouth daily.      No current facility-administered medications for this visit.     Review of Systems General: no complaints  HEENT: no complaints  Lungs: no complaints  Cardiac: no complaints  GI: no complaints  GU: no complaints  Musculoskeletal: no complaints  Extremities: no complaints  Skin: no complaints  Neuro: no complaints  Endocrine: no complaints  Psych: no complaints       Objective:  Physical Examination:  BP 138/89 (BP Location: Left Arm, Patient Position:  Sitting)   Pulse (!) 108   Temp 98 F (36.7 C) (Tympanic)   Ht 5' 2.5" (1.588 m)   Wt 153 lb 7 oz (69.6 kg)   BMI 27.62 kg/m    ECOG Performance Status: 0 - Asymptomatic  General appearance: alert, cooperative and appears stated age HEENT:PERRLA, extra ocular movement intact and sclera clear, anicteric Lymph node survey: non-palpable, axillary, inguinal, supraclavicular Cardiovascular: regular rate and rhythm Respiratory: normal air entry, lungs clear to auscultation Abdomen: soft, non-tender, without masses or organomegaly, nondistended, no hernias and well healed incision Extremities: extremities normal, atraumatic, no cyanosis or edema Skin exam -normal coloration and turgor, no rashes, no suspicious skin lesions noted Neurological exam reveals alert, oriented, normal speech, no focal findings or movement disorder noted.  Pelvic: exam chaperoned by nurse;  Vulva: normal appearing vulva with no masses, tenderness or lesions; Vagina: normal vagina; Adnexa: normal adnexa in size, nontender and no masses; Uterus: uterus is normal size, shape, consistency and nontender, retroverted; Cervix: nabothian cyst; Rectal: not indicated    Lab Review Labs on site today: none  Radiologic Imaging: n/a    Assessment:  Jaeleigh Monaco is a 55 y.o. female diagnosed with h/o stage IIB Her2/neu + right breast cancer diagnosed in 2013 s/p radiation, [facility] chemotherapy and a year of adjuvant Herceptin followed by tamoxifen.  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 She is interested in RRSO and possibly hysterectomy.   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     We discussed options for management including surveillance with ultrasound and tumor markers versus surgery.  Based on her age and history , we recommend definitive surgical evaluation with at least RRSO. We discussed advantages and disadvantages of hysterectomy. We reviewed risks of uterine carcinoma and sarcoma with tamoxifen and the incidence of uterine malignancies in the general population (see patient instructions).She asked about alternatives to tamoxifen and I referred her to Dr. Mike Gip to discuss further.    Risks were discussed in detail. These include infection, anesthesia, bleeding, transfusion, wound separation, vaginal cuff dehiscence, medical issues (blood clots, stroke, heart attack, fluid in the lungs, pneumonia, abnormal heart rhythm, death), possible exploratory surgery with larger incision, lymphedema, lymphocyst, allergic reaction, injury to adjacent organs (bowel, bladder, blood vessels, nerves, ureters, uterus)   Her gynecologist is Dr. Vikki Ports Ward and we will discuss surgical dates with Dr. Leonides Schanz.   The patient's diagnosis, an outline of the further diagnostic and laboratory studies which will be required, the  recommendation, and alternatives were discussed.  All questions were answered to the patient's satisfaction.  A total of 60 minutes were spent with the patient/family today; 75% was spent in education, counseling and coordination of care.   Gillis Ends, MD    CC:  Lequita Asal, MD [address] [place], [place] [address] [phone]

## 2015-09-30 NOTE — Progress Notes (Signed)
Patient here for [facility] results counseling. No complaints of pain or discomfort.

## 2015-09-30 NOTE — Progress Notes (Signed)
  Oncology Nurse Navigator Documentation  Navigator Location: [facility] (09/30/15 0800) Navigator Encounter Type: Clinic/MDC (09/30/15 0800)           Patient Visit Type: Initial (09/30/15 0800)                              Time Spent with Patient: 15 (09/30/15 0800)   Chaperoned pelvic exam

## 2015-10-02 ENCOUNTER — Encounter: Payer: Self-pay | Admitting: Genetic Counselor

## 2015-10-02 ENCOUNTER — Ambulatory Visit (HOSPITAL_BASED_OUTPATIENT_CLINIC_OR_DEPARTMENT_OTHER): Payer: BLUE CROSS/BLUE SHIELD | Admitting: Genetic Counselor

## 2015-10-02 DIAGNOSIS — Z1501 Genetic susceptibility to malignant neoplasm of breast: Secondary | ICD-10-CM

## 2015-10-02 DIAGNOSIS — Z800 Family history of malignant neoplasm of digestive organs: Secondary | ICD-10-CM | POA: Diagnosis not present

## 2015-10-02 DIAGNOSIS — Z8042 Family history of malignant neoplasm of prostate: Secondary | ICD-10-CM | POA: Insufficient documentation

## 2015-10-02 DIAGNOSIS — Z803 Family history of malignant neoplasm of breast: Secondary | ICD-10-CM | POA: Diagnosis not present

## 2015-10-02 DIAGNOSIS — Z315 Encounter for genetic counseling: Secondary | ICD-10-CM

## 2015-10-02 DIAGNOSIS — Z1509 Genetic susceptibility to other malignant neoplasm: Principal | ICD-10-CM

## 2015-10-02 DIAGNOSIS — Z853 Personal history of malignant neoplasm of breast: Secondary | ICD-10-CM

## 2015-10-02 NOTE — Progress Notes (Signed)
REFERRING PROVIDER: Glendon Axe, MD [place] [facility] [place], [place] [address]  PRIMARY PROVIDER:  Glendon Axe, MD  PRIMARY REASON FOR VISIT:  1. 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 Family history of breast cancer   3. Family history of colon cancer   4. Family history of prostate cancer   5. History of breast cancer      HISTORY OF PRESENT ILLNESS:   Kelly Munoz, a 55 y.o. female, was seen for a [place] cancer genetics consultation at the request of Dr. Candiss Norse due to a personal and family history of cancer.  Ms. Gondek presents to clinic today to discuss the possibility of a hereditary predisposition to cancer, genetic testing, and to further clarify her future cancer risks, as well as potential cancer risks for family members.   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 This was treated with chemotherapy, partial mastectomy, radiation therapy, herceptin and she is currently on tamoxifen.  She tried to undergo genetic testing at the time but reports that her insurance would not cover testing.  Kelly Munoz moved from [place] to [facility] and was able to undergo genetic testing through her OB/GYN provider.     CANCER HISTORY:   No history exists.     HORMONAL RISK FACTORS:  Menarche was at age 63.5.  First live birth at age 43.  OCP use for approximately 15 years.  Ovaries intact: yes.  Hysterectomy: no.  Menopausal status: postmenopausal.  HRT use: 0 years. Colonoscopy: yes; several polyps at age 89. Mammogram within the last year: yes. Number of breast biopsies: 1. Up to date with pelvic exams:  yes. Any excessive radiation exposure in the past:  For breast cancer tx  Past Medical History:  Diagnosis Date  . Breast cancer ([place])   . Breast cancer, right ([place]) 06/08/2011  . Family history of breast cancer   . Family history of colon cancer   . Family history  of prostate cancer   . History of IBS   . Hypertension     Past Surgical History:  Procedure Laterality Date  . BREAST BIOPSY Right 2013   lumpectomy chem and rad  . parotid stone      Removal  . REDUCTION MAMMAPLASTY Bilateral 02/2013    Social History   Social History  . Marital status: Married    Spouse name: N/A  . Number of children: N/A  . Years of education: N/A   Social History Main Topics  . Smoking status: Never Smoker  . Smokeless tobacco: Never Used  . Alcohol use No  . Drug use: No  . Sexual activity: Not Asked   Other Topics Concern  . None   Social History Narrative  . None     FAMILY HISTORY:  We obtained a detailed, 4-generation family history.  Significant diagnoses are listed below: Family History  Problem Relation Age of Onset  . Breast cancer Mother 84  . Kidney cancer Father     dx in his mid 27s  . Prostate cancer Maternal Uncle     dx in mid 89s  . Stomach cancer Paternal Aunt     dx in mid 77s  . Diabetes Maternal Grandmother   . Heart disease Maternal Grandmother   . Prostate cancer Maternal Grandfather   . Heart disease Paternal Grandmother   . Heart attack Paternal Grandfather   . Prostate cancer  Maternal Uncle     dx in mid 62s  . Prostate cancer Maternal Uncle     dx in mid 70s  . Colon cancer Maternal Uncle     dx in mid 60s  . Colon cancer Cousin     paternal first cousin dx in his mid 71s  . Colon cancer Cousin     paternal first cousin dx in his mid 32s    The patient has two daughters, one who underwent genetic testing recently and was negative.  She has a brother and sister who are cancer free.  The patient's mother had breast cancer in her 54's and is now cancer free.  She had three brothers who all had prostate cancer and one also had colon cancer.  The patient's maternal grandfather had prostate cancer as well.  The patient's father was diagnosed with kidney cancer in his mid 46's.  He had two sisters, one who  died of stomach cancer in her mid 89's.  The other is cancer free, but she had two sons who had colon cancer in their mid 65's, one who died of colon cancer at 90.  There is no other reported family history of cancer.  Patient's maternal ancestors are of [place] and [place] descent, and paternal ancestors are of [place] descent. There is reported Ashkenazi Jewish ancestry. There is no known consanguinity.  DISCUSSION: We discussed that about 5-10% of breast cancer is due to hereditary causes, most commonly BRCA mutations.  There are several other genes that can increase the risk for hereditary breast cancer.  We reviewed the characteristics, features and inheritance patterns of hereditary cancer syndromes. We also discussed genetic testing, including the appropriate family members to test, the process of testing, insurance coverage and turn-around-time for results. We discussed the implications of a negative, positive and/or variant of uncertain significant result.  GENETIC TESTING:  Kelly Munoz's provider recommended she pursue genetic testing of the [facility] MyRisk genes. The [facility] gene panel offered by [employer] includes sequencing and deletion/duplication testing of the following 27 genes: APC, ATM, BARD1, BMPR1A, BRCA1, BRCA2, BRIP1, CHD1, CDK4, CDKN2A, CHEK2, EPCAM (large rearrangement only), MLH1, MSH2, MSH6, MUTYH, NBN, PALB2, PMS2, PTEN, RAD51C, RAD51D, SMAD4, STK11, and TP53. Sequencing was performed for select regions of POLE and POLD1, and large rearrangement analysis was performed for select regions of GREM1. Testing revealed a mutation in the BRCA2 gene called c.5946del.  We discussed that this mutation is one of the three common mutations in the Jewish population.  Genetic testing did detect a Variant of Unknown Significance in the APC gene called c.3352A>G. At this time, it is unknown if this variant is associated with increased cancer risk or if this is a normal finding,  but most variants such as this get reclassified to being inconsequential. It should not be used to make medical management decisions. With time, we suspect the lab will determine the significance of this variant, if any. If we do learn more about it, we will try to contact Kelly Munoz to discuss it further. However, it is important to stay in touch with [place] periodically and keep the address and phone number up to date.   MEDICAL MANAGEMENT: Women who have a BRCA mutation have an increased risk for both breast and ovarian cancer.   As discussed with Kelly Munoz, to reduce the risk for breast cancer, prophylactic bilateral mastectomy is the most effective option. However, for women who choose to keep their breasts, we recommend yearly mammograms,  yearly breast MRI, twice-yearly clinical breast exams through a high-risk clinic, and monthly self-breast exams. Kelly Munoz has had a partial right mastectomy due to her breast cancer treatment.  She may want to consider a bilateral mastectomy based on this genetic test result.  Alternatively she may continue high risk screening. She had questions regarding the timing of the MRI and mammogram.  We discussed that some providers space them out every 6 months while others do them at the same time.  She can talk with her ordering provider on their preference and whether she is comfortable with that protocol.  To reduce the risk for ovarian cancer, we recommend Kelly Munoz  have a prophylactic bilateral salpingo-oophorectomy when childbearing is completed, if planned. We discussed that screening with CA-125 blood tests and transvaginal ultrasounds can be done twice per year. However, these tests have not been shown to detect ovarian cancer at an early stage.  Kelly Munoz had many questions on whether she should have her uterus removed as well.  We discussed that there are some families that have uterine cancer in the face of BRCA mutations and that some  providers do complete hysterectomies based on these reports.  However, national guidelines only address the fallopian tubes and ovaries, and not the uterus.  I referred her back to her GYN provider to discuss the pros and cons of having her uterus out as well.  RISK REDUCTION: There are several things that can be offered to individuals who are carriers for BRCA mutations that will reduce the risk for getting cancer.   The use of oral contraceptives can lower the risk for ovarian cancer, and, per case control studies, does not significantly increase the risk for breast cancer in BRCA patients. Case control studies have shown that oral contraceptives can lower the risk for ovarian cancer in women with BRCA mutations. Additionally, a more recent meta-analysis, including one cohort (n=3,181) and one case control study (1,096 cases and 2,878 controls) also showed an inverse correlation between ovarian cancer and ever having used oral contraceptives (OR, 0.58; 95% CI = 0.46-0.73). Studies on oral contraceptives and breast cancer have been conflicting, with some studies suggesting that there is not an increased risk for breast cancer in BRCA mutation carriers, while others suggest that there could be a risk. That said, two meta-analysis studies have shown that there is not an increased risk for breast cancer with oral contraceptive use in BRCA1 and BRCA2 carriers.   In individuals who have a prophylactic bilateral salpingo-oophorectomy (BSO), the risk for breast cancer is reduced by up to 50%. It has been reported that short term hormone replacement therapy in women undergoing prophylactic BSO does not negate the reduction of breast cancer risk associated with surgery (2.2016 NCCN guidelines).  FAMILY MEMBERS: It is important that all of Kelly Munoz's relatives (both men and women) know of the presence of this gene mutation. Site-specific genetic testing can sort out who in the family is at risk and who is  not.   Kelly Munoz's children and siblings have a 50% chance to have inherited this mutation. While typically we would recommend they have genetic testing for this same mutation, in this case we would recommend a full panel for both her children as well as siblings.  Based on the Ashkenazi Jewish heritage, Kelly Munoz's siblings should have at the very least the Ashkenazi Jewish panel screening.  However, there is a risk for colon cancer that is early onset on the paternal side, and  therefore her siblings are at risk for hereditary causes of colon cancer.  Additionally, Kelly Munoz's husband ha a significant family history of cancer and reports that his niece tested positive for an "ovarian cancer gene" that is NOT BRCA1 or BRCA2.  He will look further into this, but based on this information, the patient's daughters should consider a larger panel for genetic testing as well in order to identify the presence of the familial BRCA mutation, and other potential risks, in order to allow them to take advantage of risk-reducing measures.   SUPPORT AND RESOURCES: If Kelly Munoz is interested in BRCA-specific information and support, there are two groups, Facing Our Risk (www.facingourrisk.com) and Bright Pink (www.brightpink.org) which some people have found useful. They provide opportunities to speak with other individuals from high-risk families. To locate genetic counselors in other cities, visit the website of the [employer] (ArtistMovie.se) and Secretary/administrator for a Social worker by zip code.  We encouraged Kelly Munoz to remain in contact with [place] on an annual basis so we can update her personal and family histories, and let her know of advances in cancer genetics that may benefit the family. Our contact number was provided. Kelly Munoz's questions were answered to her satisfaction today, and she knows she is welcome to call anytime with additional questions.   Tharun Cappella P. Fl[place]ne [place],  [place] Certified Genetic Counselor [email or web address] phone: [phone]   The patient was seen for a total of 90 minutes in face-to-face genetic counseling.  This patient was discussed with Drs. Magrinat, Lindi Adie and/or Burr Medico who agrees with the above.    _______________________________________________________________________ For Office Staff:  Number of people involved in session: 2 Was an Intern/ student involved with case: no

## 2015-10-06 ENCOUNTER — Encounter: Payer: Self-pay | Admitting: Hematology and Oncology

## 2015-10-07 ENCOUNTER — Telehealth: Payer: Self-pay | Admitting: Obstetrics and Gynecology

## 2015-10-07 NOTE — Telephone Encounter (Signed)
I contacted Kelly Munoz about surgery. She has not yet made a decision about hysterectomy. She is going to see Dr. Leonides Schanz and discuss with her as well. I am available as needed to address any questions or assist with her surgery.  Gillis Ends, MD

## 2015-10-13 ENCOUNTER — Telehealth: Payer: Self-pay

## 2015-10-13 NOTE — Telephone Encounter (Signed)
  Oncology Nurse Navigator Documentation Received call from [place] at [facility]. Ms Kelly Munoz has decided to proceed with surgery. Dr Leonides Schanz would like Dr Theora Gianotti to assist. Surgery can be performed 10/11 with Dr Theora Gianotti. [name] will confirm with patient then book the OR. Navigator Location: [facility] (10/13/15 1000) Navigator Encounter Type: Telephone (10/13/15 1000)                                          Time Spent with Patient: 15 (10/13/15 1000)

## 2015-10-19 ENCOUNTER — Encounter: Payer: Self-pay | Admitting: Obstetrics and Gynecology

## 2015-10-29 ENCOUNTER — Other Ambulatory Visit: Payer: BLUE CROSS/BLUE SHIELD

## 2015-11-01 ENCOUNTER — Encounter: Payer: BLUE CROSS/BLUE SHIELD | Admitting: Genetic Counselor

## 2015-11-05 ENCOUNTER — Encounter: Payer: Self-pay | Admitting: *Deleted

## 2015-11-05 ENCOUNTER — Encounter
Admission: RE | Admit: 2015-11-05 | Discharge: 2015-11-05 | Disposition: A | Discharge status: Home / Self Care | Payer: BLUE CROSS/BLUE SHIELD | Source: Ambulatory Visit | Attending: Obstetrics & Gynecology | Admitting: Obstetrics & Gynecology

## 2015-11-05 DIAGNOSIS — I10 Essential (primary) hypertension: Secondary | ICD-10-CM | POA: Insufficient documentation

## 2015-11-05 DIAGNOSIS — Z01818 Encounter for other preprocedural examination: Secondary | ICD-10-CM | POA: Insufficient documentation

## 2015-11-05 HISTORY — DX: Personal history of urinary calculi: Z87.442

## 2015-11-05 HISTORY — DX: Drug-induced polyneuropathy: T45.1X5A

## 2015-11-05 HISTORY — DX: Drug-induced polyneuropathy: G62.0

## 2015-11-05 LAB — BASIC METABOLIC PANEL
Anion gap: 7 (ref 5–15)
BUN: 11 mg/dL (ref 6–20)
CO2: 28 mmol/L (ref 22–32)
Calcium: 10 mg/dL (ref 8.9–10.3)
Chloride: 105 mmol/L (ref 101–111)
Creatinine, Ser: 0.64 mg/dL (ref 0.44–1.00)
GFR calc Af Amer: >60 mL/min (ref >60)
GFR calc non Af Amer: >60 mL/min (ref >60)
Glucose, Bld: 113 mg/dL — ABNORMAL HIGH (ref 65–99)
Potassium: 3.7 mmol/L (ref 3.5–5.1)
Sodium: 140 mmol/L (ref 135–145)

## 2015-11-05 LAB — CBC
HCT: 39.6 % (ref 35.0–47.0)
Hemoglobin: 13.7 g/dL (ref 12.0–16.0)
MCH: 30.3 pg (ref 26.0–34.0)
MCHC: 34.5 g/dL (ref 32.0–36.0)
MCV: 87.9 fL (ref 80.0–100.0)
Platelets: 208 10*3/uL (ref 150–440)
RBC: 4.51 MIL/uL (ref 3.80–5.20)
RDW: 13.5 % (ref 11.5–14.5)
WBC: 6.6 10*3/uL (ref 3.6–11.0)

## 2015-11-05 LAB — TYPE AND SCREEN
ABO/RH(D): O POS
Antibody Screen: NEGATIVE

## 2015-11-05 NOTE — Patient Instructions (Signed)
  Your procedure is scheduled on: November 09, 2015 (Monday)  Report to Same Day Surgery 2nd floor Medical Mall To find out your arrival time please call [phone] between 1PM - 3PM on November 06, 2015 (Friday)  Remember: Instructions that are not followed completely may result in serious medical risk, up to and including death, or upon the discretion of your surgeon and anesthesiologist your surgery may need to be rescheduled.    _x___ 1. Do not eat food or drink liquids after midnight. No gum chewing or hard candies.     __x__ 2. No Alcohol for 24 hours before or after surgery.   __x__3. No Smoking for 24 prior to surgery.   ____  4. Bring all medications with you on the day of surgery if instructed.    __x__ 5. Notify your doctor if there is any change in your medical condition     (cold, fever, infections).     Do not wear jewelry, make-up, hairpins, clips or nail polish.  Do not wear lotions, powders, or perfumes. You may wear deodorant.  Do not shave 48 hours prior to surgery. Men may shave face and neck.  Do not bring valuables to the hospital.    [facility] is not responsible for any belongings or valuables.               Contacts, dentures or bridgework may not be worn into surgery.  Leave your suitcase in the car. After surgery it may be brought to your room.  For patients admitted to the hospital, discharge time is determined by your treatment team.   Patients discharged the day of surgery will not be allowed to drive home.    Please read over the following fact sheets that you were given:   [facility] Preparing for Surgery and or MRSA Information   _x___ Take these medicines the morning of surgery with A SIP OF WATER:    1.  ALTACE  2.  3.  4.  5.  6.  ____Fleets enema or Magnesium Citrate as directed.   _x___ Use CHG Soap or sage wipes as directed on instruction sheet   ____ Use inhalers on the day of surgery and bring to hospital day of  surgery  ____ Stop metformin 2 days prior to surgery    ____ Take 1/2 of usual insulin dose the night before surgery and none on the morning of           surgery.   _x__ Stop aspirin or coumadin, or plavix(NO ASPIRIN)  x__ Stop Anti-inflammatories such as Advil, Aleve, Ibuprofen, Motrin, Naproxen,          Naprosyn, Goodies powders or aspirin products. Ok to take Tylenol.   ____ Stop supplements until after surgery.    ____ Bring C-Pap to the hospital.

## 2015-11-05 NOTE — H&P (Signed)
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  She had a lumpectomy and radiation/chemo at that time, but did not have genetic testing.  When she moved to [place] she saw me at [facility] and we offered comprehensive genetic testing at that time and was positive for BRCA2 mutation.  She was counseled and saw Dr. Theora Munoz from [place] in consultation regarding options.  Recommendation of bilateral salpingo-oophorectomy for risk reduction was made with the option for her to ponder of hysterectomy.  Patient is on tamoxifen and doing well in remission with it.  She wants to eliminate the possibility of endometrial cancer due to estrogen exposure, and would like to proceed with a BSO plus hysterectomy.   Past Medical History:  has a past medical history of Breast cancer , unspecified (CMS-HCC) (2013); Hypertension (2011); IBS (irritable bowel syndrome); Kidney stones (1987); and Reynolds syndrome (CMS-HCC).  Past Surgical History:  has a past surgical history that includes Mastectomy Partial (Right, 02/2014); Tonsillectomy; Colonoscopy; and removal of stone (2012). Family History: family history includes Breast cancer in her mother; Colon cancer in her cousin and maternal uncle; Diabetes mellitus in her maternal grandmother; Heart attack in her maternal grandfather and paternal grandfather; Heart disease in her maternal grandfather, maternal grandmother, and paternal grandmother; Hyperlipidemia in her mother; Hypertension in her mother; Kidney cancer in her father; Prostate cancer in her maternal grandfather and maternal uncle; Stomach cancer in her paternal aunt. Social History:  reports that she has never smoked. She has never used smokeless tobacco. She reports that she does not drink alcohol or use illicit drugs. OB/GYN History:  OB History    Gravida Para Term Preterm AB Living   2 2 2   2    SAB TAB Ectopic Multiple Live  Births             Allergies: is allergic to azithromycin; clindamycin; and erythromycin. Medications:  Current Outpatient Prescriptions:  .  cholecalciferol (CHOLECALCIFEROL) 1,000 unit tablet, Take 5,000 Units by mouth once daily., Disp: , Rfl:  .  mesalamine (LIALDA) 1.2 gram EC tablet, Take 2.4 g by mouth once daily.  , Disp: , Rfl:  .  pravastatin (PRAVACHOL) 20 MG tablet, Take 20 mg by mouth once daily., Disp: , Rfl:  .  ramipril (ALTACE) 10 MG capsule, Take 20 mg by mouth once daily., Disp: , Rfl:  .  tamoxifen (NOLVADEX) 20 MG tablet, Take 1 tablet by mouth once daily., Disp: , Rfl:    Review of Systems: No SOB, no palpitations or chest pain, no new lower extremity edema, no nausea or vomiting or bowel or bladder complaints. See HPI for gyn specific ROS.   Exam:       BP 121/76   Pulse 100   Ht 5' 2.5" (1.588 m)   Wt 68 kg (150 lb)   SpO2 100%   BMI 27.00 kg/m   Physical Exam  Constitutional: She is oriented to person, place, and time and well-developed, well-nourished, and in no distress. No distress.  HENT:  Head: Normocephalic.  Neck: Neck supple. No thyromegaly present.  Cardiovascular: Normal rate, regular rhythm, normal heart sounds and intact distal pulses.  Exam reveals no gallop and no friction rub.   No murmur heard. Pulmonary/Chest: Effort normal and breath sounds normal.  Abdominal: Soft. Bowel sounds are normal. She exhibits no distension and no mass. There is no tenderness. There is no rebound and no guarding.  Genitourinary: Vagina normal, uterus normal, cervix normal, right adnexa normal and left adnexa normal.  Musculoskeletal: Normal range of motion. She exhibits no edema or tenderness.  Neurological: She is alert and oriented to person, place, and time.  Skin: Skin is warm and dry.  Psychiatric: Mood, affect and judgment normal.      Impression:    BRCA2 positive, pre-op for TLH BSO.    Plan:   40 minutes were  spent in exam and discussing the pros and cons of elective hysterectomy as well as the recommended BSO.  Consents were reviewed in detail and signed.  She elects to proceed with [place] BSO for risk reducing surgery due to BRCA2 mutation.      Kelly Bost CRIST Maddisen Vought, MD

## 2015-11-08 MED ORDER — CEFAZOLIN SODIUM-DEXTROSE 2-4 GM/100ML-% IV SOLN
2.0000 g | INTRAVENOUS | Status: AC
  Administered 2015-11-09: 2 g via INTRAVENOUS

## 2015-11-09 ENCOUNTER — Ambulatory Visit: Payer: BLUE CROSS/BLUE SHIELD | Admitting: Registered Nurse

## 2015-11-09 ENCOUNTER — Encounter
Admission: RE | Disposition: A | Discharge status: Home / Self Care | Payer: Self-pay | Source: Ambulatory Visit | Attending: Obstetrics & Gynecology

## 2015-11-09 ENCOUNTER — Encounter: Payer: Self-pay | Admitting: *Deleted

## 2015-11-09 ENCOUNTER — Ambulatory Visit
Admission: RE | Admit: 2015-11-09 | Discharge: 2015-11-09 | Disposition: A | Discharge status: Home / Self Care | Payer: BLUE CROSS/BLUE SHIELD | Source: Ambulatory Visit | Attending: Obstetrics & Gynecology | Admitting: Obstetrics & Gynecology

## 2015-11-09 DIAGNOSIS — Z853 Personal history of malignant neoplasm of breast: Secondary | ICD-10-CM | POA: Diagnosis not present

## 2015-11-09 DIAGNOSIS — N838 Other noninflammatory disorders of ovary, fallopian tube and broad ligament: Secondary | ICD-10-CM | POA: Diagnosis not present

## 2015-11-09 DIAGNOSIS — N888 Other specified noninflammatory disorders of cervix uteri: Secondary | ICD-10-CM | POA: Insufficient documentation

## 2015-11-09 DIAGNOSIS — Z1501 Genetic susceptibility to malignant neoplasm of breast: Secondary | ICD-10-CM | POA: Diagnosis not present

## 2015-11-09 DIAGNOSIS — Z881 Allergy status to other antibiotic agents status: Secondary | ICD-10-CM | POA: Diagnosis not present

## 2015-11-09 DIAGNOSIS — Z4002 Encounter for prophylactic removal of ovary: Secondary | ICD-10-CM | POA: Insufficient documentation

## 2015-11-09 DIAGNOSIS — Z8042 Family history of malignant neoplasm of prostate: Secondary | ICD-10-CM | POA: Diagnosis not present

## 2015-11-09 DIAGNOSIS — Z800 Family history of malignant neoplasm of digestive organs: Secondary | ICD-10-CM | POA: Diagnosis not present

## 2015-11-09 DIAGNOSIS — N83291 Other ovarian cyst, right side: Secondary | ICD-10-CM | POA: Diagnosis not present

## 2015-11-09 DIAGNOSIS — Z803 Family history of malignant neoplasm of breast: Secondary | ICD-10-CM | POA: Insufficient documentation

## 2015-11-09 HISTORY — PX: CYSTOSCOPY: SHX5120

## 2015-11-09 LAB — ABO/RH: ABO/RH(D): O POS

## 2015-11-09 SURGERY — OOPHORECTOMY, LAPAROSCOPIC
Anesthesia: General | Site: Bladder | Wound class: Clean

## 2015-11-09 MED ORDER — ONDANSETRON HCL 4 MG/2ML IJ SOLN
INTRAMUSCULAR | Status: DC | PRN
  Administered 2015-11-09: 4 mg via INTRAVENOUS

## 2015-11-09 MED ORDER — SUGAMMADEX SODIUM 200 MG/2ML IV SOLN
INTRAVENOUS | Status: DC | PRN
  Administered 2015-11-09: 140 mg via INTRAVENOUS

## 2015-11-09 MED ORDER — LIDOCAINE HCL (CARDIAC) 20 MG/ML IV SOLN
INTRAVENOUS | Status: DC | PRN
  Administered 2015-11-09: 80 mg via INTRAVENOUS

## 2015-11-09 MED ORDER — IBUPROFEN 600 MG PO TABS: 600.0000 mg | ORAL_TABLET | Freq: Four times a day (QID) | ORAL | 0 refills | Status: DC | PRN

## 2015-11-09 MED ORDER — EVICEL 5 ML EX KIT
PACK | CUTANEOUS | Status: DC | PRN
  Administered 2015-11-09: 5 mL

## 2015-11-09 MED ORDER — MIDAZOLAM HCL 2 MG/2ML IJ SOLN
INTRAMUSCULAR | Status: DC | PRN
  Administered 2015-11-09: 2 mg via INTRAVENOUS

## 2015-11-09 MED ORDER — ACETAMINOPHEN 10 MG/ML IV SOLN
INTRAVENOUS | Status: DC | PRN
  Administered 2015-11-09: 1000 mg via INTRAVENOUS

## 2015-11-09 MED ORDER — PROPOFOL 10 MG/ML IV BOLUS
INTRAVENOUS | Status: DC | PRN
  Administered 2015-11-09: 150 mg via INTRAVENOUS

## 2015-11-09 MED ORDER — FLUORESCEIN SODIUM 10 % IV SOLN
INTRAVENOUS | Status: DC | PRN
  Administered 2015-11-09: 50 mg via INTRAVENOUS

## 2015-11-09 MED ORDER — FENTANYL CITRATE (PF) 100 MCG/2ML IJ SOLN
INTRAMUSCULAR | Status: AC
  Administered 2015-11-09: 25 ug via INTRAVENOUS
  Filled 2015-11-09: qty 2

## 2015-11-09 MED ORDER — DEXAMETHASONE SODIUM PHOSPHATE 10 MG/ML IJ SOLN
INTRAMUSCULAR | Status: DC | PRN
  Administered 2015-11-09: 10 mg via INTRAVENOUS

## 2015-11-09 MED ORDER — FAMOTIDINE 20 MG PO TABS
20.0000 mg | ORAL_TABLET | Freq: Once | ORAL | Status: AC
  Administered 2015-11-09: 20 mg via ORAL

## 2015-11-09 MED ORDER — PHENYLEPHRINE HCL 10 MG/ML IJ SOLN
INTRAMUSCULAR | Status: DC | PRN
  Administered 2015-11-09: 100 ug via INTRAVENOUS

## 2015-11-09 MED ORDER — ROCURONIUM BROMIDE 100 MG/10ML IV SOLN
INTRAVENOUS | Status: DC | PRN
  Administered 2015-11-09: 50 mg via INTRAVENOUS
  Administered 2015-11-09: 20 mg via INTRAVENOUS

## 2015-11-09 MED ORDER — ACETAMINOPHEN 10 MG/ML IV SOLN
INTRAVENOUS | Status: AC
  Filled 2015-11-09: qty 100

## 2015-11-09 MED ORDER — CEFAZOLIN SODIUM-DEXTROSE 2-4 GM/100ML-% IV SOLN
INTRAVENOUS | Status: AC
  Filled 2015-11-09: qty 100

## 2015-11-09 MED ORDER — FENTANYL CITRATE (PF) 100 MCG/2ML IJ SOLN
25.0000 ug | INTRAMUSCULAR | Status: DC | PRN
  Administered 2015-11-09 (×3): 25 ug via INTRAVENOUS

## 2015-11-09 MED ORDER — MORPHINE SULFATE (PF) 2 MG/ML IV SOLN: 1.0000 mg | INTRAVENOUS | Status: DC | PRN

## 2015-11-09 MED ORDER — BUPIVACAINE HCL (PF) 0.5 % IJ SOLN
INTRAMUSCULAR | Status: AC
  Filled 2015-11-09: qty 30

## 2015-11-09 MED ORDER — BUPIVACAINE HCL 0.5 % IJ SOLN
INTRAMUSCULAR | Status: DC | PRN
  Administered 2015-11-09: 10 mL

## 2015-11-09 MED ORDER — LACTATED RINGERS IV SOLN
INTRAVENOUS | Status: DC
  Administered 2015-11-09 (×3): via INTRAVENOUS

## 2015-11-09 MED ORDER — SCOPOLAMINE 1 MG/3DAYS TD PT72
MEDICATED_PATCH | TRANSDERMAL | Status: AC
  Filled 2015-11-09: qty 1

## 2015-11-09 MED ORDER — KETOROLAC TROMETHAMINE 30 MG/ML IJ SOLN
INTRAMUSCULAR | Status: DC | PRN
  Administered 2015-11-09: 30 mg via INTRAVENOUS

## 2015-11-09 MED ORDER — FENTANYL CITRATE (PF) 100 MCG/2ML IJ SOLN
INTRAMUSCULAR | Status: DC | PRN
  Administered 2015-11-09 (×6): 50 ug via INTRAVENOUS

## 2015-11-09 MED ORDER — ACETAMINOPHEN-CODEINE 300-15 MG PO TABS: 1.0000 | ORAL_TABLET | ORAL | 0 refills | Status: DC | PRN

## 2015-11-09 MED ORDER — HEPARIN SODIUM (PORCINE) 5000 UNIT/ML IJ SOLN
INTRAMUSCULAR | Status: AC
  Filled 2015-11-09: qty 1

## 2015-11-09 MED ORDER — FAMOTIDINE 20 MG PO TABS
ORAL_TABLET | ORAL | Status: AC
  Filled 2015-11-09: qty 1

## 2015-11-09 MED ORDER — SCOPOLAMINE 1 MG/3DAYS TD PT72
1.0000 | MEDICATED_PATCH | TRANSDERMAL | Status: DC
  Administered 2015-11-09: 1.5 mg via TRANSDERMAL

## 2015-11-09 MED ORDER — HEPARIN SODIUM (PORCINE) 5000 UNIT/ML IJ SOLN
5000.0000 [IU] | INTRAMUSCULAR | Status: AC
  Administered 2015-11-09: 5000 [IU] via SUBCUTANEOUS

## 2015-11-09 MED ORDER — ONDANSETRON HCL 4 MG/2ML IJ SOLN
4.0000 mg | Freq: Once | INTRAMUSCULAR | Status: DC | PRN
Start: 2015-11-09 — End: 2015-11-09

## 2015-11-09 MED ORDER — FLUORESCEIN SODIUM 10 % IV SOLN
INTRAVENOUS | Status: AC
  Filled 2015-11-09: qty 5

## 2015-11-09 SURGICAL SUPPLY — 56 items
BACTOSHIELD CHG 4% 4OZ (MISCELLANEOUS) ×1
BAG URO DRAIN 2000ML W/SPOUT (MISCELLANEOUS) ×5 IMPLANT
BLADE SURG SZ11 CARB STEEL (BLADE) ×5 IMPLANT
CANISTER SUCT 1200ML W/VALVE (MISCELLANEOUS) ×5 IMPLANT
CATH FOLEY 2WAY  5CC 16FR (CATHETERS) ×1
CATH URTH 16FR FL 2W BLN LF (CATHETERS) ×4 IMPLANT
CHLORAPREP W/TINT 26ML (MISCELLANEOUS) ×10 IMPLANT
DEFOGGER SCOPE WARMER CLEARIFY (MISCELLANEOUS) ×5 IMPLANT
DEVICE SUTURE ENDOST 10MM (ENDOMECHANICALS) IMPLANT
DRAPE LEGGINS SURG 28X43 STRL (DRAPES) ×5 IMPLANT
DRAPE SHEET LG 3/4 BI-LAMINATE (DRAPES) ×5 IMPLANT
DRAPE UNDER BUTTOCK W/FLU (DRAPES) ×5 IMPLANT
GLOVE PI ORTHOPRO 6.5 (GLOVE) ×1
GLOVE PI ORTHOPRO STRL 6.5 (GLOVE) ×4 IMPLANT
GLOVE SURG SYN 6.5 ES PF (GLOVE) ×15 IMPLANT
GOWN STRL REUS W/ TWL LRG LVL3 (GOWN DISPOSABLE) ×12 IMPLANT
GOWN STRL REUS W/ TWL XL LVL3 (GOWN DISPOSABLE) ×4 IMPLANT
GOWN STRL REUS W/TWL LRG LVL3 (GOWN DISPOSABLE) ×3
GOWN STRL REUS W/TWL XL LVL3 (GOWN DISPOSABLE) ×1
IRRIGATION STRYKERFLOW (MISCELLANEOUS) ×4 IMPLANT
IRRIGATOR STRYKERFLOW (MISCELLANEOUS) ×5
IV LACTATED RINGERS 1000ML (IV SOLUTION) ×5 IMPLANT
KIT PINK PAD W/HEAD ARE REST (MISCELLANEOUS) ×5
KIT PINK PAD W/HEAD ARM REST (MISCELLANEOUS) ×4 IMPLANT
KIT RM TURNOVER CYSTO AR (KITS) ×5 IMPLANT
L-HOOK LAP DISP 36CM (ELECTROSURGICAL) ×5
LHOOK LAP DISP 36CM (ELECTROSURGICAL) ×4 IMPLANT
LIGASURE BLUNT 5MM 37CM (INSTRUMENTS) ×5 IMPLANT
LIQUID BAND (GAUZE/BANDAGES/DRESSINGS) ×5 IMPLANT
MANIPULATOR VCARE LG CRV RETR (MISCELLANEOUS) ×5 IMPLANT
MANIPULATOR VCARE SML CRV RETR (MISCELLANEOUS) IMPLANT
MANIPULATOR VCARE STD CRV RETR (MISCELLANEOUS) IMPLANT
NDL HPO THNWL 1X22GA REG BVL (NEEDLE) ×4 IMPLANT
NEEDLE SAFETY 22GX1 (NEEDLE) ×1
NS IRRIG 500ML POUR BTL (IV SOLUTION) ×5 IMPLANT
PACK LAP CHOLECYSTECTOMY (MISCELLANEOUS) ×5 IMPLANT
PAD OB MATERNITY 4.3X12.25 (PERSONAL CARE ITEMS) ×5 IMPLANT
PAD PREP 24X41 OB/GYN DISP (PERSONAL CARE ITEMS) ×5 IMPLANT
PENCIL ELECTRO HAND CTR (MISCELLANEOUS) ×5 IMPLANT
SCISSORS METZENBAUM CVD 33 (INSTRUMENTS) ×5 IMPLANT
SCRUB CHG 4% DYNA-HEX 4OZ (MISCELLANEOUS) ×4 IMPLANT
SET CYSTO W/LG BORE CLAMP LF (SET/KITS/TRAYS/PACK) ×5 IMPLANT
SLEEVE ENDOPATH XCEL 5M (ENDOMECHANICALS) ×5 IMPLANT
SPONGE XRAY 4X4 16PLY STRL (MISCELLANEOUS) ×5 IMPLANT
SURGILUBE 2OZ TUBE FLIPTOP (MISCELLANEOUS) ×5 IMPLANT
SUT ENDO VLOC 180-0-8IN (SUTURE) IMPLANT
SUT MNCRL 4-0 (SUTURE) ×1
SUT MNCRL 4-0 27XMFL (SUTURE) ×4
SUT VIC AB 0 CT1 36 (SUTURE) ×15 IMPLANT
SUT VIC AB 2-0 UR6 27 (SUTURE) ×5 IMPLANT
SUTURE MNCRL 4-0 27XMF (SUTURE) ×4 IMPLANT
SYR 10ML LL (SYRINGE) ×5 IMPLANT
TIP RIGID 35CM EVICEL (HEMOSTASIS) ×5 IMPLANT
TROCAR BLUNT TIP 12MM OMST12BT (TROCAR) IMPLANT
TROCAR XCEL NON-BLD 5MMX100MML (ENDOMECHANICALS) ×5 IMPLANT
TUBING INSUFFLATOR HEATED (MISCELLANEOUS) ×5 IMPLANT

## 2015-11-09 NOTE — Anesthesia Preprocedure Evaluation (Signed)
Anesthesia Evaluation  Patient identified by MRN, date of birth, ID band Patient awake    Reviewed: Allergy & Precautions, H&P , NPO status , Patient's Chart, lab work & pertinent test results, reviewed documented beta blocker date and time   Airway Mallampati: II  TM Distance: >3 FB Neck ROM: full    Dental  (+) Teeth Intact   Pulmonary neg pulmonary ROS,    Pulmonary exam normal        Cardiovascular hypertension, negative cardio ROS Normal cardiovascular exam Rhythm:regular Rate:Normal     Neuro/Psych  Neuromuscular disease negative neurological ROS  negative psych ROS   GI/Hepatic negative GI ROS, Neg liver ROS,   Endo/Other  negative endocrine ROS  Renal/GU negative Renal ROS  negative genitourinary   Musculoskeletal   Abdominal   Peds  Hematology negative hematology ROS (+)   Anesthesia Other Findings Past Medical History: No date: Breast cancer ([place]) 06/08/2011: Breast cancer, right ([place]) No date: Family history of breast cancer No date: Family history of colon cancer No date: Family history of prostate cancer No date: History of IBS No date: History of kidney stones No date: Hypertension No date: 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[facility]) Past Surgical History: 2013: BREAST BIOPSY Right     Comment: lumpectomy chem and rad No date: parotid stone      Comment: Removal 02/2013: REDUCTION MAMMAPLASTY Bilateral No date: TONSILLECTOMY   Reproductive/Obstetrics negative OB ROS                             Anesthesia Physical Anesthesia Plan  ASA: III  Anesthesia Plan: General ETT   Post-op Pain Management:    Induction:   Airway Management Planned:   Additional Equipment:   Intra-op Plan:   Post-operative Plan:   Informed Consent: I have reviewed the patients History and Physical, chart, labs and discussed the procedure including the risks, benefits and  alternatives for the proposed anesthesia with the patient or authorized representative who has indicated his/her understanding and acceptance.   Dental Advisory Given  Plan Discussed with: CRNA  Anesthesia Plan Comments:         Anesthesia Quick Evaluation

## 2015-11-09 NOTE — Op Note (Addendum)
Total Laparoscopic Hysterectomy Operative Note Procedure Date: 11/09/2015  Patient:  Kelly Munoz  55 y.o. 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  Normal upper abdomen.  SPECIMEN: Uterus, Cervix, and bilateral fallopian tubes and ovaries, pelvic washings  COMPLICATIONS: none apparent  DISPOSITION: vital signs stable to PACU  Indication for Surgery: 55 y.o. G2P2 with history of breast cancer and recent discover she is BRCA2 positive.  After consulting with [place], it was recommended that she remove both tubes and ovaries.  She requested removal of uterus as well so she may continue her Tamoxifen treatment without concern for endometrial cancer.  Risks of surgery were discussed with the patient including but not limited to: bleeding which may require transfusion or reoperation; infection which may require antibiotics; injury to bowel, bladder, ureters or other surrounding organs; need for additional procedures including laparotomy, blood clot, incisional problems and other postoperative/anesthesia complications. Written informed consent was obtained.      PROCEDURE IN DETAIL:  The patient had 5000u Heparin Sub-q and sequential compression devices applied to her lower extremities while in the preoperative area.  She was then taken to the operating room where general anesthesia was administered via endotracheal route.  She was placed in the dorsal lithotomy position, and was prepped and draped in a sterile manner. A surgical  time-out was performed.  A Foley catheter was inserted into her bladder and attached to constant drainage and a V-Care uterine manipulator was then advanced into the uterus and a good fit around the cervix was noted. The gloves were changed, and attention was turned to the abdomen where an umbilical incision was made with the scalpel.  A 61m trochar was inserted in the umbilical incision using a visiport method. Opening pressure was 653mg.  A survey of the patient's pelvis and abdomen revealed the findings as mentioned above. Two 73m59morts were inserted in the lower left and right quadrants under visualization.    The bilateral ureters were identified and away from the surgical field.  The bilateral IP ligaments were transected and hemostatic.  The bilateral round ligaments were transected and anterior broad ligament divided and brought across the uterus to separate the vesicouterine peritoneum and create a bladder flap. The bladder was pushed away from the uterus. The bilateral uterine arteries were ligated and transected. The bilateral uterosacral and cardinal ligaments were ligated and transected. A colpotomy was made around the V-Care cervical cup and the uterus, cervix, and bilateral tubes were removed through the vagina. The vaginal cuff was closed vaginally using 0-Vicryl in a running locking stitch. This was tested for integrity using the surgeon's finger.   After a change of gloves, the pneumoperitoneum was recreated and surgical site inspected.  There was some bleeding at the right uterine artery pedicle that was cauterized and hemostatic. Eviseal was dripped over the surgical cuff.  Bilateral ureters were visualized vermiuclating. No intraoperative injury to surrounding organs was noted. The abdomen was desufflated and all instruments were then removed.  The attention was turned to the bladder.  A 70-degree cystoscope was  inserted into the urethra after the foley was removed.  The bladder  was inflated with 300cc of normal saline, and inspection of the bladder wall was performed.  Bilateral ureteral jets were observed.  The cystoscope was removed.  All skin incisions were closed with 4-0 monocryl and covered with surgical glue. The patient tolerated the procedures well.  All instruments, needles, and sponge counts were correct x 2. The patient was taken to the recovery room in stable condition.   ---- Larey Days, MD Attending Obstetrician and Gynecologist [facility]

## 2015-11-09 NOTE — Anesthesia Procedure Notes (Signed)
Procedure Name: Intubation Date/Time: 11/09/2015 12:27 PM Performed by: Silvana Newness Pre-anesthesia Checklist: Patient identified, Emergency Drugs available, Suction available, Patient being monitored and Timeout performed Patient Re-evaluated:Patient Re-evaluated prior to inductionOxygen Delivery Method: Circle system utilized Preoxygenation: Pre-oxygenation with 100% oxygen Intubation Type: IV induction Ventilation: Mask ventilation without difficulty Laryngoscope Size: Mac and 3 Grade View: Grade II Tube type: Oral Tube size: 7.0 mm Number of attempts: 1 Airway Equipment and Method: Rigid stylet Placement Confirmation: ETT inserted through vocal cords under direct vision,  positive ETCO2 and breath sounds checked- equal and bilateral Secured at: 18 cm Tube secured with: Tape Dental Injury: Teeth and Oropharynx as per pre-operative assessment

## 2015-11-09 NOTE — Transfer of Care (Signed)
Immediate Anesthesia Transfer of Care Note  Patient: FLORASTINE MECKES  Procedure(s) Performed: Procedure(s): HYSTERECTOMY TOTAL LAPAROSCOPIC (N/A) LAPAROSCOPIC OOPHORECTOMY (Bilateral) CYSTOSCOPY (N/A)  Patient Location: PACU  Anesthesia Type:General  Level of Consciousness: awake, alert , oriented and patient cooperative  Airway & Oxygen Therapy: Patient Spontanous Breathing and Patient connected to face mask oxygen  Post-op Assessment: Report given to RN, Post -op Vital signs reviewed and stable and Patient moving all extremities X 4  Post vital signs: Reviewed and stable  Last Vitals:  Vitals:   11/09/15 1050 11/09/15 1454  BP: (!) 134/91 (P) 135/82  Pulse: 91 (!) (P) 103  Resp: 16 (P) 14  Temp: 36.2 C (P) 36.5 C    Last Pain:  Vitals:   11/09/15 1050  TempSrc: Tympanic         Complications: No apparent anesthesia complications

## 2015-11-09 NOTE — Progress Notes (Signed)
Pt chose to go home without taking pain medd   Wanted to eat a light meal first

## 2015-11-09 NOTE — H&P (Signed)
H&P Update  PLEASE SEE PRIOR H&P  Pt was last seen in my office, and complete history and physical performed.  The surgical history has been reviewed and remains accurate without interval change. The patient was re-examined and patient's physiologic condition has not changed significantly in the last 30 days.  No new pharmacological allergies or types of therapy has been initiated.  Allergies  Allergen Reactions  . Zithromax [Azithromycin] Diarrhea  . Clindamycin/Lincomycin Rash  . Erythromycin Rash    Past Medical History:  Diagnosis Date  . Breast cancer ([place])   . Breast cancer, right ([place]) 06/08/2011  . Family history of breast cancer   . Family history of colon cancer   . Family history of prostate cancer   . History of IBS   . History of kidney stones   . Hypertension   . 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[facility])    Past Surgical History:  Procedure Laterality Date  . BREAST BIOPSY Right 2013   lumpectomy chem and rad  . parotid stone      Removal  . REDUCTION MAMMAPLASTY Bilateral 02/2013  . TONSILLECTOMY      BP (!) 134/91   Pulse 91   Temp 97.1 F (36.2 C) (Tympanic)   Resp 16   SpO2 100%   NAD RRR no murmurs CTAB, no wheezing, resps unlabored +BS, soft, NTTP No c/c/e Pelvic exam deferred  The above history was confirmed with the patient. The condition still exists that makes this procedure necessary. Surgical plan includes TLH BSO as confirmed on the consent. The treatment plan remains the same, without new options for care.  The patient understands the potential benefits and risks and the consents have been signed and placed on the chart.     Larey Days, MD Attending Obstetrician Gynecologist [facility], Department of [facility]

## 2015-11-09 NOTE — Discharge Instructions (Signed)
AMBULATORY SURGERY  DISCHARGE INSTRUCTIONS   1) The drugs that you were given will stay in your system until tomorrow so for the next 24 hours you should not:  A) Drive an automobile B) Make any legal decisions C) Drink any alcoholic beverage   2) You may resume regular meals tomorrow.  Today it is better to start with liquids and gradually work up to solid foods.  You may eat anything you prefer, but it is better to start with liquids, then soup and crackers, and gradually work up to solid foods.   3) Please notify your doctor immediately if you have any unusual bleeding, trouble breathing, redness and pain at the surgery site, drainage, fever, or pain not relieved by medication.    4) Additional Instructions:        Please contact your physician with any problems or Same Day Surgery at [phone], Monday through Friday 6 am to 4 pm, or [place] [facility] number at [phone].Discharge instructions:  Call office if you have any of the following: fever >101 F, chills, excessive vaginal bleeding, incision drainage or problems, leg pain or redness, or any other concerns.   Activity: Do not lift > 10 lbs for 8 weeks.  No intercourse or tampons for 8 weeks.  No driving for 1-2 weeks.   Please don't limit yourself in terms of routine activity.  You will be able to do most things, although they may take longer to do or be a little painful.  You can do it!  Don't be a hero, but don't be a wimp either!

## 2015-11-09 NOTE — Progress Notes (Signed)
Minimal drainage on peri pad   Up to void

## 2015-11-09 NOTE — Progress Notes (Signed)
Dr. Leonides Schanz into see

## 2015-11-10 ENCOUNTER — Encounter: Payer: Self-pay | Admitting: Obstetrics & Gynecology

## 2015-11-10 NOTE — Anesthesia Postprocedure Evaluation (Signed)
Anesthesia Post Note  Patient: Kelly Munoz  Procedure(s) Performed: Procedure(s) (LRB): LAPAROSCOPIC BILATERAL OOPHORECTOMY (Bilateral) CYSTOSCOPY (N/A) TOTAL LAPAROSCOPIC HYSTERECTOMY WITH BILATERAL SALPINGECTOMY (N/A)  Patient location during evaluation: PACU Anesthesia Type: General Level of consciousness: awake and alert Pain management: pain level controlled Vital Signs Assessment: post-procedure vital signs reviewed and stable Respiratory status: spontaneous breathing, nonlabored ventilation, respiratory function stable and patient connected to nasal cannula oxygen Cardiovascular status: blood pressure returned to baseline and stable Postop Assessment: no signs of nausea or vomiting Anesthetic complications: no    Last Vitals:  Vitals:   11/09/15 1612 11/09/15 1649  BP: 125/78 121/80  Pulse: 85 88  Resp: 16   Temp: 37 C     Last Pain:  Vitals:   11/09/15 1612  TempSrc: Temporal  PainSc: 4                  Molli Barrows

## 2015-11-11 LAB — CYTOLOGY - NON PAP

## 2015-11-11 LAB — SURGICAL PATHOLOGY

## 2015-11-17 ENCOUNTER — Encounter: Payer: Self-pay | Admitting: Hematology and Oncology

## 2016-02-04 ENCOUNTER — Encounter: Payer: Self-pay | Admitting: Hematology and Oncology

## 2016-02-05 ENCOUNTER — Encounter: Payer: Self-pay | Admitting: Hematology and Oncology

## 2016-02-12 ENCOUNTER — Encounter: Payer: Self-pay | Admitting: Hematology and Oncology

## 2016-02-12 ENCOUNTER — Inpatient Hospital Stay: Payer: BLUE CROSS/BLUE SHIELD | Attending: Hematology and Oncology | Admitting: Hematology and Oncology

## 2016-02-12 VITALS — BP 133/92 | HR 105 | Temp 98.0°F | Resp 18 | Wt 154.5 lb

## 2016-02-12 DIAGNOSIS — Z148 Genetic carrier of other disease: Secondary | ICD-10-CM | POA: Insufficient documentation

## 2016-02-12 DIAGNOSIS — Z8051 Family history of malignant neoplasm of kidney: Secondary | ICD-10-CM

## 2016-02-12 DIAGNOSIS — M8580 Other specified disorders of bone density and structure, unspecified site: Secondary | ICD-10-CM | POA: Insufficient documentation

## 2016-02-12 DIAGNOSIS — Z79899 Other long term (current) drug therapy: Secondary | ICD-10-CM | POA: Diagnosis not present

## 2016-02-12 DIAGNOSIS — I10 Essential (primary) hypertension: Secondary | ICD-10-CM | POA: Insufficient documentation

## 2016-02-12 DIAGNOSIS — Z1509 Genetic susceptibility to other malignant neoplasm: Secondary | ICD-10-CM

## 2016-02-12 DIAGNOSIS — Z8042 Family history of malignant neoplasm of prostate: Secondary | ICD-10-CM | POA: Diagnosis not present

## 2016-02-12 DIAGNOSIS — Z803 Family history of malignant neoplasm of breast: Secondary | ICD-10-CM | POA: Insufficient documentation

## 2016-02-12 DIAGNOSIS — Z800 Family history of malignant neoplasm of digestive organs: Secondary | ICD-10-CM

## 2016-02-12 DIAGNOSIS — K589 Irritable bowel syndrome without diarrhea: Secondary | ICD-10-CM | POA: Diagnosis not present

## 2016-02-12 DIAGNOSIS — Z1501 Genetic susceptibility to malignant neoplasm of breast: Secondary | ICD-10-CM

## 2016-02-12 DIAGNOSIS — Z79811 Long term (current) use of aromatase inhibitors: Secondary | ICD-10-CM | POA: Diagnosis not present

## 2016-02-12 DIAGNOSIS — C50911 Malignant neoplasm of unspecified site of right female breast: Secondary | ICD-10-CM | POA: Diagnosis not present

## 2016-02-12 DIAGNOSIS — Z9221 Personal history of antineoplastic chemotherapy: Secondary | ICD-10-CM | POA: Diagnosis not present

## 2016-02-12 DIAGNOSIS — Z923 Personal history of irradiation: Secondary | ICD-10-CM | POA: Insufficient documentation

## 2016-02-12 DIAGNOSIS — Z170 Estrogen receptor positive status [ER+]: Secondary | ICD-10-CM | POA: Diagnosis not present

## 2016-02-12 NOTE — Progress Notes (Signed)
Patient had hysterectomy in October.  Doing well.  Offers no complaints today.  States she was diagnosed with BRCA positive since last visit.

## 2016-02-12 NOTE — Progress Notes (Addendum)
[facility] day:  02/12/16  Chief Complaint: Kelly Munoz is a 56 y.o. female with stage IIB Her2/neu + right breast cancer and a BRCA2 mutation who is seen for 6 month assessment.  HPI:  The patient was last seen in medical oncology clinic on 09/07/2015. At that time, genetic testing was discussed.  She had a BRCA2 mutation.  We discussed continuation of tamoxifen (5 versus 10 years).  We discussed mastectomy and oophorectomy.  She was referred to the genetics counselor at [facility] and Dr Theora Gianotti.  She saw Dr. Theora Gianotti on 09/30/2015.  Discussions were held regarding surveillance with ultrasound and markers versus surgery.  She discussed this further with Dr. Larey Days.  She underwent total laparoscopic hysterectomy and bilateral oophorectomy on 11/09/2015.  Pathology revealed no evidence of malignancy.  She saw Roma Kayser, genetics counselor on 10/02/2015.  Recommendation was for yearly mammograms, yearly breast MRI, twice-yearly clinical breast exams through a high-risk clinic, and monthly self-breast exams.  Discussions were held regarding prophylactic mastectomy and oophorectomy.  Testing of family members was discussed.  During the interim, she has done well.  She denies any change in her hot flashes s/p TAH/BSO.  She has hot flashes 1 hour after taking her tamoxifen.   She notes a history of osteopenia.  Her 2 daughters and 1 sister were tested and are BRCA2 negative.  She is working on getting her cousins checked.   Past Medical History:  Diagnosis Date  . Breast cancer ([place])   . Breast cancer, right ([place]) 06/08/2011  . Family history of breast cancer   . Family history of colon cancer   . Family history of prostate cancer   . History of IBS   . History of kidney stones   . Hypertension   . 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[facility])     Past Surgical History:  Procedure Laterality Date  . BREAST BIOPSY Right 2013    lumpectomy chem and rad  . CYSTOSCOPY N/A 11/09/2015   Procedure: CYSTOSCOPY;  Surgeon: Honor Loh Ward, MD;  Location: [facility] ORS;  Service: Gynecology;  Laterality: N/A;  . parotid stone      Removal  . REDUCTION MAMMAPLASTY Bilateral 02/2013  . TONSILLECTOMY      Family History  Problem Relation Age of Onset  . Breast cancer Mother 49  . Kidney cancer Father     dx in his mid 57s  . Prostate cancer Maternal Uncle     dx in mid 68s  . Stomach cancer Paternal Aunt     dx in mid 30s  . Diabetes Maternal Grandmother   . Heart disease Maternal Grandmother   . Prostate cancer Maternal Grandfather   . Heart disease Paternal Grandmother   . Heart attack Paternal Grandfather   . Prostate cancer Maternal Uncle     dx in mid 60s  . Prostate cancer Maternal Uncle     dx in mid 27s  . Colon cancer Maternal Uncle     dx in mid 70s  . Colon cancer Cousin     paternal first cousin dx in his mid 3s  . Colon cancer Cousin     paternal first cousin dx in his mid 55s    Social History:  reports that she has never smoked. She has never used smokeless tobacco. She reports that she does not drink alcohol or use drugs.  She is from [place].  She moved to [facility]  at the end of 03/2015 to be with her family.  Her parents live in [place].  She lives with her husband.  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  She has 2 daughters (30 year-old in graduate school, 55 year-old in [place]).  Her husband had an MI and was hospitalized in [place].  The patient is alone today.  Allergies:  Allergies  Allergen Reactions  . Zithromax [Azithromycin] Diarrhea  . Clindamycin/Lincomycin Rash  . Erythromycin Rash    Current Medications: Current Outpatient Prescriptions  Medication Sig Dispense Refill  . Cholecalciferol (VITAMIN D3) 5000 units CAPS Take 5,000 Units by mouth daily.    Marland Kitchen LIALDA 1.2 g EC tablet Take 2.4 g by mouth daily with  breakfast.     . pravastatin (PRAVACHOL) 20 MG tablet Take 20 mg by mouth at bedtime.     . ramipril (ALTACE) 10 MG capsule Take 20 mg by mouth daily.     . tamoxifen (NOLVADEX) 20 MG tablet Take 20 mg by mouth every evening.     Marland Kitchen acetaminophen (TYLENOL) 325 MG tablet Take 650 mg by mouth every 6 (six) hours as needed for moderate pain or headache.    . Acetaminophen-Codeine 300-15 MG TABS Take 1 tablet by mouth every 4 (four) hours as needed. (Patient not taking: Reported on 02/12/2016) 15 each 0  . Carboxymethylcellulose Sodium (LUBRICATING PLUS EYE DROPS OP) Apply 1 drop to eye daily as needed (dry eyes).    . fluticasone (FLONASE) 50 MCG/ACT nasal spray Place 1-2 sprays into both nostrils daily as needed for allergies or rhinitis.    Marland Kitchen ibuprofen (ADVIL,MOTRIN) 600 MG tablet Take 1 tablet (600 mg total) by mouth every 6 (six) hours as needed. (Patient not taking: Reported on 02/12/2016) 30 tablet 0   No current facility-administered medications for this visit.     Review of Systems:  GENERAL:  Feels good.  No fevers, sweats or weight loss.  Weight stbale. PERFORMANCE STATUS (ECOG):  0 HEENT:  No visual changes, runny nose, sore throat, mouth sores or tenderness. Lungs: No shortness of breath or cough.  No hemoptysis. Cardiac:  No chest pain, palpitations, orthopnea, or PND. GI:  Irritable bowel.  No nausea, vomiting, diarrhea, constipation, melena or hematochezia. GU:  No urgency, frequency, dysuria, or hematuria. s/p TAH/BSO (see HPI). Musculoskeletal:  No back pain.  No joint pain.  No muscle tenderness. Extremities:  No pain or swelling. Skin:  No rashes or skin changes. Neuro:  Chemo brain.  Neuropathy in legs.  No headache, numbness or weakness, balance or coordination issues. Endocrine:  No diabetes, thyroid issues.  No change in hot flashes or night sweats. Psych:  No mood changes, depression or anxiety. Pain:  No focal pain. Review of systems:  All other systems reviewed and found  to be negative.  Physical Exam: Blood pressure (!) 133/92, pulse (!) 105, temperature 98 F (36.7 C), temperature source Tympanic, resp. rate 18, weight 154 lb 8.7 oz (70.1 kg). GENERAL:  Well developed, well nourished, woman sitting comfortably in the exam room in no acute distress. MENTAL STATUS:  Alert and oriented to person, place and time. HEAD:  Long wavey brown hair.  Normocephalic, atraumatic, face symmetric, no Cushingoid features. EYES: Blue eyes.  Pupils equal round and reactive to light and accomodation.  No conjunctivitis or scleral icterus. ENT:  Oropharynx clear without lesion.  Tongue normal. Mucous membranes moist.  RESPIRATORY:  Clear to auscultation  without rales, wheezes or rhonchi. CARDIOVASCULAR:  Regular rate and rhythm without murmur, rub or gallop. BREAST:  Right breast with inferior scarring s/p surgery and radiation.  No masses, skin changes or nipple discharge.  Left breast medial scarring s/p breast reduction without masses, skin changes or nipple discharge.  ABDOMEN:  Soft, non-tender, with active bowel sounds, and no hepatosplenomegaly.  No masses. SKIN:  No rashes, ulcers or lesions. EXTREMITIES: No edema, no skin discoloration or tenderness.  No palpable cords. LYMPH NODES: No palpable cervical, supraclavicular, axillary or inguinal adenopathy  NEUROLOGICAL: Unremarkable. PSYCH:  Appropriate.   No visits with results within 3 Day(s) from this visit.  Latest known visit with results is:  Admission on 11/09/2015, Discharged on 11/09/2015  Component Date Value Ref Range Status  . ABO/RH(D) 11/09/2015 O POS   Final  . CYTOLOGY - NON GYN 11/09/2015    Final                   Value:Cytology - Non PAP CASE: [id number] PATIENT: Kelly Munoz Non-Gyn Cytology Report     SPECIMEN SUBMITTED: A. Pelvic washings  CLINICAL HISTORY: None Provided  PRE-OPERATIVE DIAGNOSIS: BRCA2 carrier  POST-OPERATIVE DIAGNOSIS: None  provided.     DIAGNOSIS: A. PELVIS; WASHINGS: - NEGATIVE FOR MALIGNANT CELLS. - INFLAMMATION AND REACTIVE MESOTHELIAL CELLS.  Note: Cell block material and ThinPrep slides were reviewed.   GROSS DESCRIPTION:  A. Specimen Labeled: pelvic washing Volume: 50 mL Description: clear fluid Cell block(s): 1 and 1 ThinPrep Final Diagnosis performed by Delorse Lek, MD.  Electronically signed 11/11/2015 4:34:16PM    The electronic signature indicates that the named Attending Pathologist has evaluated the specimen  Technical component performed at [facility], [address] [place], [place] [address] Lab: [phone] Dir: Kelly Munoz. Evette Doffing, MD  Professional component performed at [facility], [facility],                          Kelly Munoz, Kelly Munoz, Kelly Munoz Lab: [phone] Dir: Kelly Munoz. Reuel Derby, MD    . SURGICAL PATHOLOGY 11/09/2015    Final                   Value:Surgical Pathology CASE: [id number] PATIENT: Kelly Munoz Surgical Pathology Report     SPECIMEN SUBMITTED: A. Uterus, cervix and bil. fall. tubes and ovaries  CLINICAL HISTORY: None provided  PRE-OPERATIVE DIAGNOSIS: BRCA2 carrier  POST-OPERATIVE DIAGNOSIS: Same as pre-op     DIAGNOSIS: A. UTERUS WITH CERVIX AND BILATERAL FALLOPIAN TUBES AND OVARIES; HYSTERECTOMY WITH BILATERAL SALPINGO-OOPHORECTOMY: - NABOTHIAN CYSTS OF THE ENDOCERVIX. - WEAKLY PROLIFERATIVE ENDOMETRIUM. - PARATUBAL CYSTS OF BILATERAL FALLOPIAN TUBES. - CORTICAL INCLUSION CYSTS AND STROMAL HYPERPLASIA OF THE RIGHT OVARY. - STROMAL HYPERPLASIA OF THE LEFT OVARY.   GROSS DESCRIPTION:  A. The specimen is received in a formalin-filled container labeled with the patient's name and uterus the cervix bilateral tubes and ovaries.  Weight: 82 grams Dimensions:      Fundus -4.6 x 4.5 x 3.2 cm      Cervix -3.4 x 2.9 cm Serosa: smooth pink-tan Cervix: smooth pink Endocervix: trab                          ecular pink-tan with focal cysts Endometrial cavity:      Dimensions -3.7 x 1.4 cm      Thickness -0.1 cm      Other findings -none noted  Myometrium:     Thickness -1.7 cm     Other findings -none noted Adnexa:      Right ovary           Measurement -2.8 x 1.5 x 1.5 cm           Serosa -lobulated pink-tan           Cut surface -heterogeneous pink-tan      Right fallopian tube           Measurements -4.6 cm in length x 1.0 cm in diameter           Other findings -fimbriated with paratubal cysts      Left ovary           Measurement -2.6 x 1.8 x 1.3 cm           Serosa -lobulated pink-tan           Cut surface -heterogeneous pink-tan      Left fallopian tube            Measurements -4.5 cm in length x 1.1 cm in diameter           Other findings -fimbriated Other comments: none noted  Block summary:  1-representative anterior cervix 2-representative posterior cervix 3-representative of anterior endomyometrium 4-representative posterior endomyometrium 5-6-ent                         ire right fallopian tube cross-sections and longitudinal fimbriated end 7-9-entire right ovary 10-11-entire left fallopian tube cross-sections and longitudinal fimbriated end 12-14-entire left ovary  Final Diagnosis performed by Delorse Lek, MD.  Electronically signed 11/11/2015 2:37:12PM    The electronic signature indicates that the named Attending Pathologist has evaluated the specimen  Technical component performed at [facility], [address] [place], [place] [address] Lab: [phone] Dir: Kelly Munoz. Evette Doffing, MD  Professional component performed at [facility], [facility], [place], [place], [place] [address] Lab: [phone] Dir: Kelly Munoz. Rubinas, MD     [facility] Labs:   CBC on 02/04/2016 included a hematocrit 39.3, hemoglobin 12.9, MCV 89, platelets 232,000, white count 7400 with an [place] of 4600.  Comprehensive metabolic panel included a sodium of 140,  potassium of 4.3, creatinine 0.65, bilirubin 0.3, alkaline phosphatase 59, SGOT 36, SGPT 33. CA 19-9 was 21. CA 27-29 was 19.4.   Assessment:  ZURRI RUDDEN is a 56 y.o. female with stage IIB Her2/neu + right breast cancer s/p right partial mastectomy and sentinel lymph node biopsy on 06/08/2011.  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  There was solid, cribriform and comedo DCIS with necrosis.  One of 5 lymph nodes were positive.  There was a macro-metastasis in one intramammary lymph node.  Tumor was ER positive (88.8%), PR positive (90.93%), and Her2/neu 3+.  Ki-67 was 48%.   Pathologic stage was T2N1aM0.   My Risk Genetic testing on 07/13/2015 revealed a BRCA2 mutation (c.5946del). 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  She received 6 cycles of TCH chemotherapy beginning 07/11/2011.  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  She completed 1 year of adjuvant Herceptin.  At one point, her EF decreased (no records available).  EF returned to normal per patient report (last echo was in 2014).  She received radiation (completed before the end of 2013).  She began tamoxifen in 01/2012.    CA27.29 has been  followed: 13.2 on 03/04/2015, 21.2 on 08/14/2015, and 19.4 on 02/04/2016.  Bilateral mammogram on 08/03/2015 revealed post-lumpectomy changes in the right breast.  She has a history of osteopenia.  She underwent total laparoscopic hysterectomy and bilateral oophorectomy on 11/09/2015.  Pathology revealed no evidence of malignancy.  Symptomatically, she notes some hot flashes after taking her tamoxifen.  Exam is stable.  Plan: 1.  Review [facility] labs. 2.  Discuss interval genetic counseling and gynecology assessment, and surgery.  Discuss pathology from TAH/BSO- benign. 3.  Discuss plan for yearly mammogram and mammogram alternating every 6 months. 4.  Discuss consideration of switch to an  aromatase inhibitor as she is post menopausal.  Discuss side effects.  Information provided.  She worries about "adding something new".  Discuss continuation of tamoxifen for now.  Consider switch at year 5.  Discuss baseline bone density study. 5.  Schedule bilateral breast MRI 6.  Schedule bilateral mammogram (6 months after MRI). 7.  Schedule bone density study. 8.  RTC in 6 months for MD assessment, review of labs, breast MRI, bone density.   Lequita Asal, MD  02/12/2016, 11:14 AM

## 2016-02-12 NOTE — Patient Instructions (Signed)
Letrozole tablets What is this medicine? LETROZOLE (LET roe zole) blocks the production of estrogen. It is used to treat breast cancer. This medicine may be used for other purposes; ask your health care provider or pharmacist if you have questions. COMMON BRAND NAME(S): Femara What should I tell my health care provider before I take this medicine? They need to know if you have any of these conditions: -high cholesterol -liver disease -osteoporosis (weak bones) -an unusual or allergic reaction to letrozole, other medicines, foods, dyes, or preservatives -pregnant or trying to get pregnant -breast-feeding How should I use this medicine? Take this medicine by mouth with a glass of water. You may take it with or without food. Follow the directions on the prescription label. Take your medicine at regular intervals. Do not take your medicine more often than directed. Do not stop taking except on your doctor's advice. Talk to your pediatrician regarding the use of this medicine in children. Special care may be needed. Overdosage: If you think you have taken too much of this medicine contact a poison control center or emergency room at once. NOTE: This medicine is only for you. Do not share this medicine with others. What if I miss a dose? If you miss a dose, take it as soon as you can. If it is almost time for your next dose, take only that dose. Do not take double or extra doses. What may interact with this medicine? Do not take this medicine with any of the following medications: -estrogens, like hormone replacement therapy or birth control pills This medicine may also interact with the following medications: -dietary supplements such as androstenedione or DHEA -prasterone -tamoxifen This list may not describe all possible interactions. Give your health care provider a list of all the medicines, herbs, non-prescription drugs, or dietary supplements you use. Also tell them if you smoke, drink  alcohol, or use illegal drugs. Some items may interact with your medicine. What should I watch for while using this medicine? Tell your doctor or healthcare professional if your symptoms do not start to get better or if they get worse. Do not become pregnant while taking this medicine or for 3 weeks after stopping it. Women should inform their doctor if they wish to become pregnant or think they might be pregnant. There is a potential for serious side effects to an unborn child. Talk to your health care professional or pharmacist for more information. Do not breast-feed while taking this medicine or for 3 weeks after stopping it. This medicine may interfere with the ability to have a child. Talk with your doctor or health care professional if you are concerned about your fertility. Using this medicine for a long time may increase your risk of low bone mass. Talk to your doctor about bone health. You may get drowsy or dizzy. Do not drive, use machinery, or do anything that needs mental alertness until you know how this medicine affects you. Do not stand or sit up quickly, especially if you are an older patient. This reduces the risk of dizzy or fainting spells. You may need blood work done while you are taking this medicine. What side effects may I notice from receiving this medicine? Side effects that you should report to your doctor or health care professional as soon as possible: -allergic reactions like skin rash, itching, or hives -bone fracture -chest pain -signs and symptoms of a blood clot such as breathing problems; changes in vision; chest pain; severe, sudden headache; pain, swelling,   warmth in the leg; trouble speaking; sudden numbness or weakness of the face, arm or leg -vaginal bleeding Side effects that usually do not require medical attention (report to your doctor or health care professional if they continue or are bothersome): -bone, back, joint, or muscle  pain -dizziness -fatigue -fluid retention -headache -hot flashes, night sweats -nausea -weight gain This list may not describe all possible side effects. Call your doctor for medical advice about side effects. You may report side effects to FDA at 1-800-FDA-1088. Where should I keep my medicine? Keep out of the reach of children. Store between 15 and 30 degrees C (59 and 86 degrees F). Throw away any unused medicine after the expiration date. NOTE: This sheet is a summary. It may not cover all possible information. If you have questions about this medicine, talk to your doctor, pharmacist, or health care provider.  2017 Elsevier/Gold Standard (2015-08-03 11:10:41)

## 2016-02-14 DIAGNOSIS — M8580 Other specified disorders of bone density and structure, unspecified site: Secondary | ICD-10-CM | POA: Insufficient documentation

## 2016-02-16 ENCOUNTER — Ambulatory Visit (INDEPENDENT_AMBULATORY_CARE_PROVIDER_SITE_OTHER): Payer: BLUE CROSS/BLUE SHIELD | Admitting: Podiatry

## 2016-02-16 VITALS — Resp 16

## 2016-02-16 DIAGNOSIS — L608 Other nail disorders: Secondary | ICD-10-CM | POA: Diagnosis not present

## 2016-02-16 DIAGNOSIS — B351 Tinea unguium: Secondary | ICD-10-CM

## 2016-02-16 DIAGNOSIS — L603 Nail dystrophy: Secondary | ICD-10-CM | POA: Diagnosis not present

## 2016-02-16 NOTE — Progress Notes (Signed)
   Subjective:    Patient ID: Kelly Munoz, female    DOB: 01/17/1960, 56 y.o.   MRN: [id number]  HPI    Review of Systems  All other systems reviewed and are negative.      Objective:   Physical Exam        Assessment & Plan:

## 2016-02-16 NOTE — Progress Notes (Signed)
Patient ID: Kelly Munoz, female   DOB: November 04, 1960, 56 y.o.   MRN: [id number]   Subjective:  Patient presents today as a new patient for evaluation of fungal nail to the left great toenail. Patient was diagnosed with onychomycosis in 2015 by dermatologist at which time Jublia topical antifungal was initiated. Patient states that she tried the [place] for approximately 1 year without any improvement of the fungal nail. Patient is currently using a homeopathic remedy consisting of 1/3 tea tree oil, 1/3 oregano oil, and 1/3 coconut oil.  Patient states that she has noticed improvement.    Objective/Physical Exam General: The patient is alert and oriented x3 in no acute distress.  Dermatology: Left great toenail appears to be approximately 75% dystrophic with discoloration and thickening more prominent on the lateral aspect of the toenail. Skin is warm, dry and supple bilateral lower extremities. Negative for open lesions or macerations.  Vascular: Palpable pedal pulses bilaterally. No edema or erythema noted. Capillary refill within normal limits.  Neurological: Epicritic and protective threshold grossly intact bilaterally.   Musculoskeletal Exam: Range of motion within normal limits to all pedal and ankle joints bilateral. Muscle strength 5/5 in all groups bilateral.   Assessment: #1 onychomycosis left great toenail    Plan of Care:  #1 Patient was evaluated. #2 today we discussed the different treatment options including topical antifungal, oral antifungal, and laser treatment. The patient does not want to proceed with oral antifungal medication. She states she does have a fatty liver and his recent cancer survivor and would rather not be put on additional oral medication. #3 patient wants to proceed with laser treatment. #4 appointment with Janett Billow in our [place] office for laser treatment. The patient will likely need 3-4 treatments approximately 4 weeks apart #5 return to clinic  when necessary   Edrick Kins, DPM [facility]  Dr. Edrick Kins, [place]                                        [place], [place] [address]                Office [phone]  Fax [phone]

## 2016-02-17 ENCOUNTER — Ambulatory Visit (HOSPITAL_COMMUNITY): Admission: RE | Admit: 2016-02-17 | Payer: BLUE CROSS/BLUE SHIELD | Source: Ambulatory Visit

## 2016-02-23 ENCOUNTER — Ambulatory Visit (HOSPITAL_COMMUNITY): Payer: BLUE CROSS/BLUE SHIELD

## 2016-02-23 ENCOUNTER — Encounter (HOSPITAL_COMMUNITY): Payer: Self-pay

## 2016-02-23 ENCOUNTER — Ambulatory Visit
Admission: RE | Admit: 2016-02-23 | Discharge: 2016-02-23 | Disposition: A | Discharge status: Home / Self Care | Payer: BLUE CROSS/BLUE SHIELD | Source: Ambulatory Visit | Attending: Hematology and Oncology | Admitting: Hematology and Oncology

## 2016-02-23 DIAGNOSIS — C50911 Malignant neoplasm of unspecified site of right female breast: Secondary | ICD-10-CM

## 2016-02-23 DIAGNOSIS — Z1501 Genetic susceptibility to malignant neoplasm of breast: Secondary | ICD-10-CM

## 2016-02-23 DIAGNOSIS — Z1509 Genetic susceptibility to other malignant neoplasm: Secondary | ICD-10-CM

## 2016-02-23 DIAGNOSIS — Z170 Estrogen receptor positive status [ER+]: Principal | ICD-10-CM

## 2016-02-23 MED ORDER — GADOBENATE DIMEGLUMINE 529 MG/ML IV SOLN
14.0000 mL | Freq: Once | INTRAVENOUS | Status: AC | PRN
Start: 2016-02-23 — End: 2016-02-23
  Administered 2016-02-23: 14 mL via INTRAVENOUS

## 2016-02-24 ENCOUNTER — Ambulatory Visit (INDEPENDENT_AMBULATORY_CARE_PROVIDER_SITE_OTHER): Payer: Self-pay

## 2016-02-24 DIAGNOSIS — B351 Tinea unguium: Secondary | ICD-10-CM

## 2016-02-24 DIAGNOSIS — L608 Other nail disorders: Secondary | ICD-10-CM

## 2016-02-24 NOTE — Progress Notes (Signed)
Pt presents with mycotic infection of nails Lt hallux   All other systems are negative  Laser therapy administered to affected nails and tolerated well. All safety precautions were in place.Re-appointed in 1 month for 2nd treatment

## 2016-02-25 ENCOUNTER — Encounter: Payer: Self-pay | Admitting: Hematology and Oncology

## 2016-02-25 ENCOUNTER — Telehealth: Payer: Self-pay | Admitting: *Deleted

## 2016-02-25 NOTE — Telephone Encounter (Signed)
Patient called wanting results of breast MRI.  Per MD let patient know there was no evidence of cancer.  Called patient back to inform her that there is no evidence of cancer.  At that time, patient asked for copy of MRI.  Directed her to Medical Records for copy.

## 2016-03-22 ENCOUNTER — Ambulatory Visit: Payer: Self-pay | Admitting: Podiatry

## 2016-03-22 DIAGNOSIS — B351 Tinea unguium: Secondary | ICD-10-CM

## 2016-03-23 ENCOUNTER — Ambulatory Visit
Admission: RE | Admit: 2016-03-23 | Discharge: 2016-03-23 | Disposition: A | Discharge status: Home / Self Care | Payer: BLUE CROSS/BLUE SHIELD | Source: Ambulatory Visit | Attending: Hematology and Oncology | Admitting: Hematology and Oncology

## 2016-03-23 DIAGNOSIS — Z170 Estrogen receptor positive status [ER+]: Secondary | ICD-10-CM | POA: Diagnosis not present

## 2016-03-23 DIAGNOSIS — C50911 Malignant neoplasm of unspecified site of right female breast: Secondary | ICD-10-CM | POA: Insufficient documentation

## 2016-03-23 DIAGNOSIS — M85851 Other specified disorders of bone density and structure, right thigh: Secondary | ICD-10-CM | POA: Diagnosis not present

## 2016-03-24 ENCOUNTER — Other Ambulatory Visit: Payer: Self-pay | Admitting: *Deleted

## 2016-03-24 MED ORDER — TAMOXIFEN CITRATE 20 MG PO TABS: 20.0000 mg | ORAL_TABLET | Freq: Every evening | ORAL | 1 refills | Status: DC

## 2016-03-25 NOTE — Progress Notes (Signed)
Pt presents with mycotic infection of nails Lt hallux   All other systems are negative  Laser therapy administered to affected nails and tolerated well. All safety precautions were in place.Re-appointed in 1 month for 3rd treatment

## 2016-04-19 ENCOUNTER — Ambulatory Visit: Payer: Self-pay

## 2016-04-19 DIAGNOSIS — R52 Pain, unspecified: Secondary | ICD-10-CM

## 2016-04-19 DIAGNOSIS — B351 Tinea unguium: Secondary | ICD-10-CM

## 2016-04-22 NOTE — Progress Notes (Signed)
Pt presents with mycotic infection of nails Lt hallux   All other systems are negative  Laser therapy administered to affected nails and tolerated well. All safety precautions were in place.Re-appointed in 1 month for 4th treatment

## 2016-07-04 ENCOUNTER — Emergency Department: Payer: BLUE CROSS/BLUE SHIELD

## 2016-07-04 ENCOUNTER — Emergency Department
Admission: EM | Admit: 2016-07-04 | Discharge: 2016-07-04 | Disposition: A | Discharge status: Home / Self Care | Payer: BLUE CROSS/BLUE SHIELD | Attending: Emergency Medicine | Admitting: Emergency Medicine

## 2016-07-04 ENCOUNTER — Encounter: Payer: Self-pay | Admitting: Emergency Medicine

## 2016-07-04 DIAGNOSIS — Z79899 Other long term (current) drug therapy: Secondary | ICD-10-CM | POA: Insufficient documentation

## 2016-07-04 DIAGNOSIS — M5431 Sciatica, right side: Secondary | ICD-10-CM | POA: Diagnosis not present

## 2016-07-04 DIAGNOSIS — I10 Essential (primary) hypertension: Secondary | ICD-10-CM | POA: Insufficient documentation

## 2016-07-04 DIAGNOSIS — M545 Low back pain: Secondary | ICD-10-CM | POA: Diagnosis present

## 2016-07-04 DIAGNOSIS — Z853 Personal history of malignant neoplasm of breast: Secondary | ICD-10-CM | POA: Insufficient documentation

## 2016-07-04 MED ORDER — CYCLOBENZAPRINE HCL 5 MG PO TABS: 5.0000 mg | ORAL_TABLET | Freq: Three times a day (TID) | ORAL | 0 refills | Status: DC | PRN

## 2016-07-04 MED ORDER — ORPHENADRINE CITRATE 30 MG/ML IJ SOLN
60.0000 mg | Freq: Once | INTRAMUSCULAR | Status: AC
  Administered 2016-07-04: 60 mg via INTRAMUSCULAR
  Filled 2016-07-04: qty 2

## 2016-07-04 MED ORDER — DEXAMETHASONE SODIUM PHOSPHATE 10 MG/ML IJ SOLN
10.0000 mg | Freq: Once | INTRAMUSCULAR | Status: AC
  Administered 2016-07-04: 10 mg via INTRAMUSCULAR
  Filled 2016-07-04: qty 1

## 2016-07-04 MED ORDER — PREDNISONE 10 MG (21) PO TBPK: ORAL_TABLET | ORAL | 0 refills | Status: DC

## 2016-07-04 NOTE — ED Triage Notes (Signed)
Developed right lower back pain which moves into right leg  Hx of sciatica  Pain started 4 days ago

## 2016-07-04 NOTE — ED Notes (Signed)
AAOx3.  Skin warm and dry.  NAD 

## 2016-07-04 NOTE — ED Provider Notes (Signed)
[facility] Emergency Department Provider Note   ____________________________________________   I have reviewed the triage vital signs and the nursing notes.   HISTORY  Chief Complaint Back Pain    HPI Kelly Munoz is a 56 y.o. female presents with right side lumbar back pain with radiating pain down the right lower extremity to the posterior calf. Patient describes right sided sciatica pain radiating down the right gluteal, posterior thigh, gastroc region. Patient only able to tolerate the supine position. Eyes any sensation changes or motor weakness related to current symptoms. Patient noted onset of pain 4 days ago that she is try to conservatively treat with NSAID and activity modification. Patient denies any traumatic injury to the back however she notes that she had to change her sleeping position while recovering from having skin cancer removed from the left lower leg. Patient denies any bowel or bladder dysfunction. Patient denies saddle anesthesia.Patient denies fever, chills, headache, vision changes, chest pain, chest tightness, shortness of breath, abdominal pain, nausea and vomiting.  Past Medical History:  Diagnosis Date  . Breast cancer ([place])   . Breast cancer, right ([place]) 06/08/2011  . Family history of breast cancer   . Family history of colon cancer   . Family history of prostate cancer   . History of IBS   . History of kidney stones   . Hypertension   . 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[facility])     Patient Active Problem List   Diagnosis Date Noted  . Osteopenia 02/14/2016  . Family history of breast cancer   . Family history of colon cancer   . Family history of prostate cancer   . History of breast cancer 09/30/2015  . 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 Cancer of right breast with BRCA2 gene mutation ([place]) 09/09/2015  . Breast cancer, right ([place]) 06/08/2011    Past Surgical History:  Procedure Laterality Date  .  ABDOMINAL HYSTERECTOMY    . BREAST BIOPSY Right 2013   lumpectomy chem and rad  . CYSTOSCOPY N/A 11/09/2015   Procedure: CYSTOSCOPY;  Surgeon: Honor Loh Ward, MD;  Location: [facility] ORS;  Service: Gynecology;  Laterality: N/A;  . parotid stone      Removal  . REDUCTION MAMMAPLASTY Bilateral 02/2013  . TONSILLECTOMY      Prior to Admission medications   Medication Sig Start Date End Date Taking? Authorizing Provider  acetaminophen (TYLENOL) 325 MG tablet Take 650 mg by mouth every 6 (six) hours as needed for moderate pain or headache.    [provider]  Carboxymethylcellulose Sodium (LUBRICATING PLUS EYE DROPS OP) Apply 1 drop to eye daily as needed (dry eyes).    [provider]  Cholecalciferol (VITAMIN D3) 5000 units CAPS Take 5,000 Units by mouth daily.    [provider]  cyclobenzaprine (FLEXERIL) 5 MG tablet Take 1 tablet (5 mg total) by mouth 3 (three) times daily as needed. 07/04/16   Lindey Renzulli M, PA-C  fluticasone (FLONASE) 50 MCG/ACT nasal spray Place 1-2 sprays into both nostrils daily as needed for allergies or rhinitis.    [provider]  ibuprofen (ADVIL,MOTRIN) 600 MG tablet Take 1 tablet (600 mg total) by mouth every 6 (six) hours as needed. Patient not taking: Reported on 02/12/2016 11/09/15   Ward, Honor Loh, MD  LIALDA 1.2 g EC tablet Take 2.4 g by mouth daily with breakfast.  05/27/15   [provider]  pravastatin (PRAVACHOL) 20 MG tablet Take 20 mg  by mouth at bedtime.  08/07/15   [provider]  predniSONE (STERAPRED UNI-PAK 21 TAB) 10 MG (21) TBPK tablet Take 6 tablets on day 1. Take 5 tablets on day 2. Take 4 tablets on day 3. Take 3 tablets on day 4. Take 2 tablets on day 5. Take 1 tablets on day 6. 07/04/16   Mainor Hellmann M, PA-C  ramipril (ALTACE) 10 MG capsule Take 20 mg by mouth daily.  06/15/15   [provider]  tamoxifen (NOLVADEX) 20 MG tablet Take 1 tablet (20 mg total) by mouth every evening.  03/24/16   Lequita Asal, MD    Allergies Zithromax [azithromycin]; Clindamycin/lincomycin; and Erythromycin  Family History  Problem Relation Age of Onset  . Breast cancer Mother 85  . Kidney cancer Father        dx in his mid 59s  . Prostate cancer Maternal Uncle        dx in mid 50s  . Stomach cancer Paternal Aunt        dx in mid 37s  . Diabetes Maternal Grandmother   . Heart disease Maternal Grandmother   . Prostate cancer Maternal Grandfather   . Heart disease Paternal Grandmother   . Heart attack Paternal Grandfather   . Prostate cancer Maternal Uncle        dx in mid 35s  . Prostate cancer Maternal Uncle        dx in mid 30s  . Colon cancer Maternal Uncle        dx in mid 70s  . Colon cancer Cousin        paternal first cousin dx in his mid 42s  . Colon cancer Cousin        paternal first cousin dx in his mid 5s    Social History Social History  Substance Use Topics  . Smoking status: Never Smoker  . Smokeless tobacco: Never Used  . Alcohol use No    Review of Systems Constitutional: Negative for fever/chills Eyes: No visual changes. ENT:  Negative for sore throat and for difficulty swallowing Cardiovascular: Denies chest pain. Respiratory: Denies cough Denies shortness of breath. Gastrointestinal: No abdominal pain.  No nausea, vomiting, diarrhea. Musculoskeletal: Positive for right side lumbar back pain with sciatica. Increased pain with upright and standing.  Skin: Negative for rash. Neurological: Negative for headaches.  Negative focal weakness or numbness. Able to ambulate. ____________________________________________   PHYSICAL EXAM:  VITAL SIGNS: Patient Vitals for the past 24 hrs:  BP Temp Temp src Pulse Resp SpO2 Height Weight  07/04/16 1400 (!) 150/80 - - 94 18 99 % - -  07/04/16 1208 - - - - - - 5' 2"  (1.575 m) 69.4 kg (153 lb)  07/04/16 1207 (!) 154/87 99 F (37.2 C) Oral (!) 104 20 99 % - -    Constitutional: Alert and  oriented. Well appearing and in no acute distress.  Head: Normocephalic and atraumatic. Eyes: Conjunctivae are normal. PERRL. Normal extraocular movements. Mouth/Throat: Mucous membranes are moist.  Neck: Supple.  Cardiovascular: Normal rate, regular rhythm. Normal distal pulses. Respiratory: Normal respiratory effort. Lungs CTAB with no W/R/R. Musculoskeletal: Right side lumbar back pain with radiating pain down gluteal, posterior thigh and gastroch. Negative sensation changes or motor deficients. Otherwise, nontender with normal range of motion in all extremities. Neurologic: Normal speech and language. No gross focal neurologic deficits are appreciated. No gait instability. No sensory loss or abnormal reflexes.  Skin:  Skin is warm, dry and  intact. No rash noted. Psychiatric: Mood and affect are normal.  ____________________________________________   LABS (all labs ordered are listed, but only abnormal results are displayed)  Labs Reviewed - No data to display ____________________________________________  EKG None ____________________________________________  RADIOLOGY DG lumbar complete ____________________________________________   PROCEDURES  Procedure(s) performed: no    Critical Care performed: no ____________________________________________   INITIAL IMPRESSION / ASSESSMENT AND PLAN / ED COURSE  Pertinent labs & imaging results that were available during my care of the patient were reviewed by me and considered in my medical decision making (see chart for details).  Patient presented with right sided lumbar back pain and sciatic symptoms that began 4 days ago unrelated to a traumatic injury. Patient has a history of intermittent episodes of sciatica symptoms that she normally treats conservatively. History, physical exam findings and imaging are reassuring of no acute fracture or neurovascular deficits. Patient responded well to Decadron 10 mg IM and Norflex given  during the course of care in emergency department. Patient will be given a prednisone taper and Flexeril to be taken as needed. Patient patient has a scheduled appointment with her orthopedic provider this upcoming Tuesday and will be following up regarding current symptoms. Patient informed of clinical course, understand medical decision-making process, and agree with plan.  Patient was advised to follow up with her Orthopedic provider and was also advised to return to the emergency department for symptoms that change or worsen.      ____________________________________________   FINAL CLINICAL IMPRESSION(S) / ED DIAGNOSES  Final diagnoses:  Sciatica of right side       NEW MEDICATIONS STARTED DURING THIS VISIT:  New Prescriptions   CYCLOBENZAPRINE (FLEXERIL) 5 MG TABLET    Take 1 tablet (5 mg total) by mouth 3 (three) times daily as needed.   PREDNISONE (STERAPRED UNI-PAK 21 TAB) 10 MG (21) TBPK TABLET    Take 6 tablets on day 1. Take 5 tablets on day 2. Take 4 tablets on day 3. Take 3 tablets on day 4. Take 2 tablets on day 5. Take 1 tablets on day 6.     Note:  This document was prepared using Dragon voice recognition software and may include unintentional dictation errors.    Jerolyn Shin, PA-C 07/04/16 1623    Darel Hong, MD 07/05/16 1136

## 2016-07-04 NOTE — ED Notes (Signed)
First Nurse: pt ambulatory to stat desk with reports of low back and sciatica pain started three days ago. Pt with hx of same. Went to [facility] first and was told they would have to wait two hrs. NAD.

## 2016-07-14 ENCOUNTER — Other Ambulatory Visit: Payer: Self-pay | Admitting: Orthopedic Surgery

## 2016-07-14 DIAGNOSIS — M5416 Radiculopathy, lumbar region: Secondary | ICD-10-CM

## 2016-07-18 ENCOUNTER — Ambulatory Visit
Admission: RE | Admit: 2016-07-18 | Discharge: 2016-07-18 | Disposition: A | Discharge status: Home / Self Care | Payer: BLUE CROSS/BLUE SHIELD | Source: Ambulatory Visit | Attending: Orthopedic Surgery | Admitting: Orthopedic Surgery

## 2016-07-18 DIAGNOSIS — M5127 Other intervertebral disc displacement, lumbosacral region: Secondary | ICD-10-CM | POA: Diagnosis not present

## 2016-07-18 DIAGNOSIS — M5416 Radiculopathy, lumbar region: Secondary | ICD-10-CM

## 2016-08-03 ENCOUNTER — Other Ambulatory Visit: Payer: Self-pay | Admitting: Hematology and Oncology

## 2016-08-03 ENCOUNTER — Ambulatory Visit
Admission: RE | Admit: 2016-08-03 | Discharge: 2016-08-03 | Disposition: A | Discharge status: Home / Self Care | Payer: BLUE CROSS/BLUE SHIELD | Source: Ambulatory Visit | Attending: Hematology and Oncology | Admitting: Hematology and Oncology

## 2016-08-03 DIAGNOSIS — Z1509 Genetic susceptibility to other malignant neoplasm: Secondary | ICD-10-CM

## 2016-08-03 DIAGNOSIS — Z9889 Other specified postprocedural states: Secondary | ICD-10-CM | POA: Insufficient documentation

## 2016-08-03 DIAGNOSIS — Z1501 Genetic susceptibility to malignant neoplasm of breast: Secondary | ICD-10-CM

## 2016-08-03 DIAGNOSIS — C50911 Malignant neoplasm of unspecified site of right female breast: Secondary | ICD-10-CM

## 2016-08-03 DIAGNOSIS — Z853 Personal history of malignant neoplasm of breast: Secondary | ICD-10-CM | POA: Insufficient documentation

## 2016-08-03 DIAGNOSIS — Z170 Estrogen receptor positive status [ER+]: Principal | ICD-10-CM

## 2016-08-05 ENCOUNTER — Encounter: Payer: Self-pay | Admitting: Hematology and Oncology

## 2016-08-07 NOTE — Progress Notes (Signed)
[facility] day:  08/08/16  Chief Complaint: Kelly Munoz is a 56 y.o. female with stage IIB Her2/neu + right breast cancer and a BRCA2 mutation who is seen for 6 month assessment.  HPI:  The patient was last seen in medical oncology clinic on 02/12/2016.  At that time, she was seen following TAH/BSO and follow-up with the genetic counselor.  She declined switch from tamoxifen to an aromatase inhibitor.  She would reconsider after 5 years.  We discussed th plan for yearly mammograms, yearly breast MRI, twice-yearly clinical breast exams through a high-risk clinic, and monthly self-breast exams.    Bilateral breast MRI on 02/23/2016 revealed no evidence of malignancy.  Bone density on 03/23/2016 revealed osteopenia with a T-score of -1.4 in the right femoral neck.  Lumbar spine MRI without contrast on 07/18/2016 revealed extruded disc fragment on the right L5-S1 with compression of the right S1 nerve root.  Bilateral mammogram on 08/03/2016 revealed no evidence of malignancy in either breast.  Symptomatically, she notes sciatic nerve issues. She has just come off a prednisone taper. She notes that she is slowly getting better. Regarding her elevated liver function tests, she states that she is taking a statin drug. She has fatty liver. She denies any alcohol, new medications or herbal products.   Past Medical History:  Diagnosis Date  . Breast cancer ([place])   . Breast cancer, right ([place]) 06/08/2011  . Family history of breast cancer   . Family history of colon cancer   . Family history of prostate cancer   . History of IBS   . History of kidney stones   . Hypertension   . 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[facility])     Past Surgical History:  Procedure Laterality Date  . ABDOMINAL HYSTERECTOMY    . BREAST BIOPSY Right 2013   lumpectomy chem and rad  . CYSTOSCOPY N/A 11/09/2015   Procedure: CYSTOSCOPY;  Surgeon: Honor Loh Ward, MD;   Location: [facility] ORS;  Service: Gynecology;  Laterality: N/A;  . parotid stone      Removal  . REDUCTION MAMMAPLASTY Bilateral 02/2013  . TONSILLECTOMY      Family History  Problem Relation Age of Onset  . Breast cancer Mother 3  . Kidney cancer Father        dx in his mid 74s  . Prostate cancer Maternal Uncle        dx in mid 64s  . Stomach cancer Paternal Aunt        dx in mid 63s  . Diabetes Maternal Grandmother   . Heart disease Maternal Grandmother   . Prostate cancer Maternal Grandfather   . Heart disease Paternal Grandmother   . Heart attack Paternal Grandfather   . Prostate cancer Maternal Uncle        dx in mid 31s  . Prostate cancer Maternal Uncle        dx in mid 54s  . Colon cancer Maternal Uncle        dx in mid 59s  . Colon cancer Cousin        paternal first cousin dx in his mid 96s  . Colon cancer Cousin        paternal first cousin dx in his mid 29s    Social History:  reports that she has never smoked. She has never used smokeless tobacco. She reports that she does not drink alcohol or use drugs.  She is  from [place].  She moved to [place] at the end of 03/2015 to be with her family.  Her parents live in [place].  She lives with her husband.  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  She has 2 daughters (15 year-old in graduate school, 55 year-old in [place]).  Her husband had an MI and was hospitalized in [place].  The patient is accompanied by her husband, Al, today.  Allergies:  Allergies  Allergen Reactions  . Zithromax [Azithromycin] Diarrhea  . Clindamycin/Lincomycin Rash  . Erythromycin Rash    Current Medications: Current Outpatient Prescriptions  Medication Sig Dispense Refill  . acetaminophen (TYLENOL) 325 MG tablet Take 650 mg by mouth every 6 (six) hours as needed for moderate pain or headache.    . Carboxymethylcellulose Sodium (LUBRICATING PLUS EYE DROPS OP) Apply 1 drop to  eye daily as needed (dry eyes).    . Cholecalciferol (VITAMIN D3) 5000 units CAPS Take 5,000 Units by mouth daily.    . cyclobenzaprine (FLEXERIL) 5 MG tablet Take 1 tablet (5 mg total) by mouth 3 (three) times daily as needed. 20 tablet 0  . fluticasone (FLONASE) 50 MCG/ACT nasal spray Place 1-2 sprays into both nostrils daily as needed for allergies or rhinitis.    Marland Kitchen ibuprofen (ADVIL,MOTRIN) 600 MG tablet Take 1 tablet (600 mg total) by mouth every 6 (six) hours as needed. 30 tablet 0  . LIALDA 1.2 g EC tablet Take 2.4 g by mouth daily with breakfast.     . pravastatin (PRAVACHOL) 20 MG tablet Take 20 mg by mouth at bedtime.     . ramipril (ALTACE) 10 MG capsule Take 20 mg by mouth daily.     . tamoxifen (NOLVADEX) 20 MG tablet Take 1 tablet (20 mg total) by mouth every evening. 90 tablet 1  . predniSONE (STERAPRED UNI-PAK 21 TAB) 10 MG (21) TBPK tablet Take 6 tablets on day 1. Take 5 tablets on day 2. Take 4 tablets on day 3. Take 3 tablets on day 4. Take 2 tablets on day 5. Take 1 tablets on day 6. (Patient not taking: Reported on 08/08/2016) 21 tablet 0   No current facility-administered medications for this visit.     Review of Systems:  GENERAL:  Feels good.  No fevers, sweats or weight loss.  Weight up 2 pounds. PERFORMANCE STATUS (ECOG):  0 HEENT:  No visual changes, runny nose, sore throat, mouth sores or tenderness. Lungs: No shortness of breath or cough.  No hemoptysis. Cardiac:  No chest pain, palpitations, orthopnea, or PND. GI:  Irritable bowel.  No nausea, vomiting, diarrhea, constipation, melena or hematochezia. GU:  No urgency, frequency, dysuria, or hematuria. s/p TAH/BSO (see HPI). Musculoskeletal: Sciatica.  Lumbar spine MRI notes compression of right S1 (see HPI).  No joint pain.  No muscle tenderness. Extremities:  No pain or swelling. Skin:  No rashes or skin changes. Neuro:  Chemo brain.  Neuropathy in legs.  No headache, numbness or weakness, balance or coordination  issues. Endocrine:  No diabetes, thyroid issues.  No change in hot flashes or night sweats. Psych:  No mood changes, depression or anxiety. Pain:  No focal pain. Review of systems:  All other systems reviewed and found to be negative.  Physical Exam: Blood pressure 119/83, pulse (!) 109, temperature 97.8 F (36.6 C), resp. rate 18, weight 156 lb 6 oz (70.9 kg). GENERAL:  Well developed, well nourished, woman sitting  comfortably in the exam room in no acute distress. MENTAL STATUS:  Alert and oriented to person, place and time. HEAD:  Long curly brown hair.  Normocephalic, atraumatic, face symmetric, no Cushingoid features. EYES: Blue eyes.  Pupils equal round and reactive to light and accomodation.  No conjunctivitis or scleral icterus. ENT:  Oropharynx clear without lesion.  Tongue normal. Mucous membranes moist.  RESPIRATORY:  Clear to auscultation without rales, wheezes or rhonchi. CARDIOVASCULAR:  Regular rate and rhythm without murmur, rub or gallop. BREAST:  Right breast with inferior scarring s/p surgery and radiation.  No masses, skin changes or nipple discharge.  Left breast medial scarring s/p breast reduction without masses, skin changes or nipple discharge.  ABDOMEN:  Soft, non-tender, with active bowel sounds, and no hepatosplenomegaly.  No masses. SKIN:  No rashes, ulcers or lesions. EXTREMITIES: No edema, no skin discoloration or tenderness.  No palpable cords. LYMPH NODES: No palpable cervical, supraclavicular, axillary or inguinal adenopathy  NEUROLOGICAL: Unremarkable. PSYCH:  Appropriate.   No visits with results within 3 Day(s) from this visit.  Latest known visit with results is:  Admission on 11/09/2015, Discharged on 11/09/2015  Component Date Value Ref Range Status  . ABO/RH(D) 11/09/2015 O POS   Final  . CYTOLOGY - NON GYN 11/09/2015    Final                   Value:Cytology - Non PAP CASE: [id number] PATIENT: Kelly Munoz Non-Gyn Cytology  Report     SPECIMEN SUBMITTED: A. Pelvic washings  CLINICAL HISTORY: None Provided  PRE-OPERATIVE DIAGNOSIS: BRCA2 carrier  POST-OPERATIVE DIAGNOSIS: None provided.     DIAGNOSIS: A. PELVIS; WASHINGS: - NEGATIVE FOR MALIGNANT CELLS. - INFLAMMATION AND REACTIVE MESOTHELIAL CELLS.  Note: Cell block material and ThinPrep slides were reviewed.   GROSS DESCRIPTION:  A. Specimen Labeled: pelvic washing Volume: 50 mL Description: clear fluid Cell block(s): 1 and 1 ThinPrep Final Diagnosis performed by Delorse Lek, MD.  Electronically signed 11/11/2015 4:34:16PM    The electronic signature indicates that the named Attending Pathologist has evaluated the specimen  Technical component performed at [facility], [address] [place], [place] [address] Lab: [phone] Dir: Darrick Penna. Evette Doffing, MD  Professional component performed at [facility], [facility],                          [place], [place], [place] [address] Lab: [phone] Dir: Dellia Nims. Reuel Derby, MD    . SURGICAL PATHOLOGY 11/09/2015    Final                   Value:Surgical Pathology CASE: [id number] PATIENT: Kelly Munoz Surgical Pathology Report     SPECIMEN SUBMITTED: A. Uterus, cervix and bil. fall. tubes and ovaries  CLINICAL HISTORY: None provided  PRE-OPERATIVE DIAGNOSIS: BRCA2 carrier  POST-OPERATIVE DIAGNOSIS: Same as pre-op     DIAGNOSIS: A. UTERUS WITH CERVIX AND BILATERAL FALLOPIAN TUBES AND OVARIES; HYSTERECTOMY WITH BILATERAL SALPINGO-OOPHORECTOMY: - NABOTHIAN CYSTS OF THE ENDOCERVIX. - WEAKLY PROLIFERATIVE ENDOMETRIUM. - PARATUBAL CYSTS OF BILATERAL FALLOPIAN TUBES. - CORTICAL INCLUSION CYSTS AND STROMAL HYPERPLASIA OF THE RIGHT OVARY. - STROMAL HYPERPLASIA OF THE LEFT OVARY.   GROSS DESCRIPTION:  A. The specimen is received in a formalin-filled container labeled with the patient's name and uterus the cervix bilateral tubes and  ovaries.  Weight: 82 grams Dimensions:      Fundus -4.6 x 4.5 x 3.2 cm  Cervix -3.4 x 2.9 cm Serosa: smooth pink-tan Cervix: smooth pink Endocervix: trab                         ecular pink-tan with focal cysts Endometrial cavity:      Dimensions -3.7 x 1.4 cm      Thickness -0.1 cm      Other findings -none noted Myometrium:     Thickness -1.7 cm     Other findings -none noted Adnexa:      Right ovary           Measurement -2.8 x 1.5 x 1.5 cm           Serosa -lobulated pink-tan           Cut surface -heterogeneous pink-tan      Right fallopian tube           Measurements -4.6 cm in length x 1.0 cm in diameter           Other findings -fimbriated with paratubal cysts      Left ovary           Measurement -2.6 x 1.8 x 1.3 cm           Serosa -lobulated pink-tan           Cut surface -heterogeneous pink-tan      Left fallopian tube            Measurements -4.5 cm in length x 1.1 cm in diameter           Other findings -fimbriated Other comments: none noted  Block summary:  1-representative anterior cervix 2-representative posterior cervix 3-representative of anterior endomyometrium 4-representative posterior endomyometrium 5-6-ent                         ire right fallopian tube cross-sections and longitudinal fimbriated end 7-9-entire right ovary 10-11-entire left fallopian tube cross-sections and longitudinal fimbriated end 12-14-entire left ovary  Final Diagnosis performed by Delorse Lek, MD.  Electronically signed 11/11/2015 2:37:12PM    The electronic signature indicates that the named Attending Pathologist has evaluated the specimen  Technical component performed at [facility], [address] [place], [place] [address] Lab: [phone] Dir: Darrick Penna. Evette Doffing, MD  Professional component performed at [facility], [facility], [place], [place], [place] [address] Lab: [phone] Dir: Dellia Nims. Rubinas, MD     [facility] Labs:   CBC  on 02/04/2016 included a hematocrit 39.3, hemoglobin 12.9, MCV 89, platelets 232,000, white count 7400 with an [place] of 4600.  Comprehensive metabolic panel included a sodium of 140, potassium of 4.3, creatinine 0.65, bilirubin 0.3, alkaline phosphatase 59, SGOT 36, SGPT 33. CA 19-9 was 21. CA 27-29 was 19.4.    CBC on the 08/01/2016 revealed a hematocrit of 41.7, hemoglobin 13.2, MCV 90, platelets 237,000, white count 7600 with an ANC of 4000. Comprehensive metabolic panel included a sodium of 41, potassium 4.3, creatinine 0.67, bilirubin 0.2, alkaline phosphatase 56, AST 49,and  ALT 45. CA19-9 was 29. CA27.29 was 17.2.   Assessment:  Kelly Munoz is a 56 y.o. female with stage IIB Her2/neu + right breast cancer s/p right partial mastectomy and sentinel lymph node biopsy on 06/08/2011.  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  There was solid, cribriform and comedo DCIS with necrosis.  One of 5 lymph nodes were positive.  There was a macro-metastasis in one  intramammary lymph node.  Tumor was ER positive (88.8%), PR positive (90.93%), and Her2/neu 3+.  Ki-67 was 48%.   Pathologic stage was T2N1aM0.   My Risk Genetic testing on 07/13/2015 revealed a BRCA2 mutation ([id number].[id number]). 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  She received 6 cycles of TCH chemotherapy beginning 07/11/2011.  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  She completed 1 year of adjuvant Herceptin.  At one point, her EF decreased (no records available).  EF returned to normal per patient report (last echo was in 2014).  She received radiation (completed before the end of 2013).  She began tamoxifen in 01/2012.    CA27.29 has been followed: 13.2 on 03/04/2015, 21.2 on 08/14/2015, 19.4 on 02/04/2016, and 17.2 on 08/01/2016.  Bilateral breast MRI on 02/23/2016 revealed no evidence of malignancy.  Bilateral mammogram on 08/03/2016 revealed   No evidence of malignancy.    Bone density on 03/23/2016 revealed osteopenia with a T-score of -1.4 in the right femoral neck.  She underwent total laparoscopic hysterectomy and bilateral oophorectomy on 11/09/2015.  Pathology revealed no evidence of malignancy.  She has mildly increased liver function tests.  She has fatty liver.  She is on a statin drug.  She denies any history of hepatitis, new medication or herbal product.    Symptomatically, she has had issues with right S1 nerve root compression.  She is tolerating tamoxifen.  Exam is stable.  Plan: 1.  Review [facility] labs. 2.  Review interval mammogram and breast MRI.  No evidence of malignancy.  Continue yearly breast MRI and mammogram alternating every 6 months. 3.  Discuss bone density study.  Discuss calcium and vitamin D. 4.  Continue tamoxifen. 5.  Discuss mildly increased liver function tests. Etiology is unclear. She has fatty liver. She is also on a statin drug.  She denies any new medications or herbal products.  She denies any history of hepatitis.  Plan to repeat liver function tests as well as hepatitis serologies next month. 6.  Schedule bilateral mammogram (6 months after MRI). 7.  RTC in 6 months for MD assessment and review of [facility] labs.   Lequita Asal, MD  08/08/2016, 11:00 AM

## 2016-08-08 ENCOUNTER — Encounter: Payer: Self-pay | Admitting: Hematology and Oncology

## 2016-08-08 ENCOUNTER — Inpatient Hospital Stay: Payer: BLUE CROSS/BLUE SHIELD | Attending: Hematology and Oncology | Admitting: Hematology and Oncology

## 2016-08-08 VITALS — BP 119/83 | HR 109 | Temp 97.8°F | Resp 18 | Wt 156.4 lb

## 2016-08-08 DIAGNOSIS — Z9221 Personal history of antineoplastic chemotherapy: Secondary | ICD-10-CM | POA: Diagnosis not present

## 2016-08-08 DIAGNOSIS — Z15068 Genetic susceptibility to malignant neoplasm of ovary: Secondary | ICD-10-CM

## 2016-08-08 DIAGNOSIS — Z170 Estrogen receptor positive status [ER+]: Secondary | ICD-10-CM | POA: Diagnosis not present

## 2016-08-08 DIAGNOSIS — M8580 Other specified disorders of bone density and structure, unspecified site: Secondary | ICD-10-CM

## 2016-08-08 DIAGNOSIS — Z79810 Long term (current) use of selective estrogen receptor modulators (SERMs): Secondary | ICD-10-CM | POA: Insufficient documentation

## 2016-08-08 DIAGNOSIS — Z1501 Genetic susceptibility to malignant neoplasm of breast: Secondary | ICD-10-CM

## 2016-08-08 DIAGNOSIS — C50911 Malignant neoplasm of unspecified site of right female breast: Secondary | ICD-10-CM | POA: Insufficient documentation

## 2016-08-08 DIAGNOSIS — Z923 Personal history of irradiation: Secondary | ICD-10-CM | POA: Diagnosis not present

## 2016-08-08 DIAGNOSIS — R748 Abnormal levels of other serum enzymes: Secondary | ICD-10-CM

## 2016-08-08 DIAGNOSIS — K760 Fatty (change of) liver, not elsewhere classified: Secondary | ICD-10-CM | POA: Insufficient documentation

## 2016-08-08 NOTE — Progress Notes (Signed)
Patient offers no complaints today.  Here today for MRI results and bone density results.  Patient had labs at [facility] and would like a copy of those today.

## 2016-08-18 ENCOUNTER — Ambulatory Visit: Payer: BLUE CROSS/BLUE SHIELD | Admitting: Hematology and Oncology

## 2016-09-16 ENCOUNTER — Encounter: Payer: Self-pay | Admitting: Hematology and Oncology

## 2016-09-19 ENCOUNTER — Telehealth: Payer: Self-pay | Admitting: *Deleted

## 2016-09-19 NOTE — Telephone Encounter (Signed)
Called patient to review labs per patient's e-mail request.  Liver function is increased.  Inquired if patient is on any statin drugs.  She states she is on Prevastatin 20 mg daily. She saw Dr. Rolla Etienne in 2014 or 2015 and was told she had a fatty liver.  At that time her enzymes were normal.  She also had a CT of her liver and kidneys as well as an ultrasound.  She does not drink alcohol.

## 2016-09-20 ENCOUNTER — Encounter: Payer: Self-pay | Admitting: Hematology and Oncology

## 2016-09-20 NOTE — Telephone Encounter (Signed)
Called patient to inquire if she is in agreement to have her LFT's rechecked in 2-4 weeks.  She gets her labs drawn @ [employer] [facility] and is requesting a form be mailed to her.  She also has sent another e-mail to MD.  Will print out and take to MD in [facility] clinic tomorrow.

## 2016-09-20 NOTE — Telephone Encounter (Signed)
  Would repeat LFTs in 2-4 weeks.  If remains elevated, we should do an abdominal CT to ensure no issues.  M

## 2016-09-26 ENCOUNTER — Other Ambulatory Visit: Payer: Self-pay | Admitting: Hematology and Oncology

## 2016-10-12 ENCOUNTER — Encounter: Payer: Self-pay | Admitting: Hematology and Oncology

## 2016-10-14 ENCOUNTER — Encounter: Payer: Self-pay | Admitting: Hematology and Oncology

## 2016-10-14 ENCOUNTER — Telehealth: Payer: Self-pay | Admitting: *Deleted

## 2016-10-14 ENCOUNTER — Other Ambulatory Visit: Payer: Self-pay | Admitting: Urgent Care

## 2016-10-14 DIAGNOSIS — R945 Abnormal results of liver function studies: Secondary | ICD-10-CM

## 2016-10-14 NOTE — Telephone Encounter (Signed)
-----   Message from Lequita Asal, MD sent at 10/14/2016 10:22 AM EDT ----- Regarding: Please call patient  LFTs slightly higher  7/23: AST 49 (0-40), ALT 45 (0-32) 10/02:  AST 67, ALT 62  Could be fatty liver.  Consider CT abdomen to look at liver.  M

## 2016-10-14 NOTE — Telephone Encounter (Addendum)
Called patient to inform her that her LFT's are continuing to increase.  Per MD could be fatty liver, but would like to obtain CT of abdomen. Patient is in agreement.

## 2016-10-21 ENCOUNTER — Ambulatory Visit
Admission: RE | Admit: 2016-10-21 | Discharge: 2016-10-21 | Disposition: A | Discharge status: Home / Self Care | Payer: BLUE CROSS/BLUE SHIELD | Source: Ambulatory Visit | Attending: Urgent Care | Admitting: Urgent Care

## 2016-10-21 DIAGNOSIS — K760 Fatty (change of) liver, not elsewhere classified: Secondary | ICD-10-CM | POA: Diagnosis not present

## 2016-10-21 DIAGNOSIS — R7989 Other specified abnormal findings of blood chemistry: Secondary | ICD-10-CM | POA: Insufficient documentation

## 2016-10-21 DIAGNOSIS — M5127 Other intervertebral disc displacement, lumbosacral region: Secondary | ICD-10-CM | POA: Diagnosis not present

## 2016-10-21 DIAGNOSIS — R911 Solitary pulmonary nodule: Secondary | ICD-10-CM | POA: Diagnosis not present

## 2016-10-21 DIAGNOSIS — K769 Liver disease, unspecified: Secondary | ICD-10-CM | POA: Insufficient documentation

## 2016-10-21 DIAGNOSIS — R945 Abnormal results of liver function studies: Secondary | ICD-10-CM

## 2016-10-21 MED ORDER — IOPAMIDOL (ISOVUE-370) INJECTION 76%
100.0000 mL | Freq: Once | INTRAVENOUS | Status: AC | PRN
  Administered 2016-10-21: 100 mL via INTRAVENOUS

## 2016-10-24 ENCOUNTER — Encounter: Payer: Self-pay | Admitting: Hematology and Oncology

## 2016-10-24 ENCOUNTER — Telehealth: Payer: Self-pay | Admitting: *Deleted

## 2016-10-24 NOTE — Telephone Encounter (Signed)
Called pt and let her know that her ct scan showed fatty liver. A few other spots on liver looks like cysts.  Small pulmonary nodule that is too small to do anything about but will be monitored in a year with another ct scan.  She would like to get a copy of this and her labs from Sept.  I called [facility] and got the results , they were not scanned in. Patient can come tom and pick up envelope with results in it. She is agreeable to this because she wants to show it to her cardiologist.

## 2016-10-29 ENCOUNTER — Encounter: Payer: Self-pay | Admitting: Hematology and Oncology

## 2016-11-01 DIAGNOSIS — K7581 Nonalcoholic steatohepatitis (NASH): Secondary | ICD-10-CM | POA: Insufficient documentation

## 2016-11-02 ENCOUNTER — Encounter: Payer: Self-pay | Admitting: Hematology and Oncology

## 2016-11-05 DIAGNOSIS — R42 Dizziness and giddiness: Secondary | ICD-10-CM | POA: Insufficient documentation

## 2016-11-10 ENCOUNTER — Ambulatory Visit
Admission: RE | Admit: 2016-11-10 | Discharge: 2016-11-10 | Disposition: A | Discharge status: Home / Self Care | Payer: Self-pay | Source: Ambulatory Visit | Attending: Urgent Care | Admitting: Urgent Care

## 2016-11-10 ENCOUNTER — Other Ambulatory Visit: Payer: Self-pay | Admitting: Urgent Care

## 2016-11-10 DIAGNOSIS — R945 Abnormal results of liver function studies: Principal | ICD-10-CM

## 2016-11-10 DIAGNOSIS — R7989 Other specified abnormal findings of blood chemistry: Secondary | ICD-10-CM

## 2016-12-16 ENCOUNTER — Encounter: Payer: Self-pay | Admitting: Hematology and Oncology

## 2017-01-30 ENCOUNTER — Ambulatory Visit
Admission: RE | Admit: 2017-01-30 | Discharge: 2017-01-30 | Disposition: A | Discharge status: Home / Self Care | Payer: BLUE CROSS/BLUE SHIELD | Source: Ambulatory Visit | Attending: Hematology and Oncology | Admitting: Hematology and Oncology

## 2017-01-30 DIAGNOSIS — Z1501 Genetic susceptibility to malignant neoplasm of breast: Principal | ICD-10-CM

## 2017-01-30 DIAGNOSIS — C50911 Malignant neoplasm of unspecified site of right female breast: Secondary | ICD-10-CM

## 2017-01-30 MED ORDER — GADOBENATE DIMEGLUMINE 529 MG/ML IV SOLN
14.0000 mL | Freq: Once | INTRAVENOUS | Status: AC | PRN
  Administered 2017-01-30: 14 mL via INTRAVENOUS

## 2017-01-31 ENCOUNTER — Telehealth: Payer: Self-pay | Admitting: *Deleted

## 2017-01-31 NOTE — Telephone Encounter (Signed)
Called patient to inform her that ALT and AST are still mildly elevated.  Patient states she feels fine.  She had a CT that Dr. Mike Gip ordered recently.  She says the radiologist compared them to the one she had in 2015 and there were several lesions that had increased in size.  She further states she had a breast MRI yesterday.  She will see her cardiologist tomorrow and see Dr. Mike Gip next week.  She has been on Statin drugs for 6 months but has now stopped taking them.  She would like to wait and speak with Dr. Mike Gip when she comes for her appointment next week.

## 2017-02-01 NOTE — Telephone Encounter (Signed)
  The CT was done after our last clinic visit.  There was then an addendum after a comparison CT was found.  I agree, we will discuss next week.  M

## 2017-02-06 ENCOUNTER — Ambulatory Visit: Payer: BLUE CROSS/BLUE SHIELD | Admitting: Anesthesiology

## 2017-02-06 ENCOUNTER — Encounter: Payer: Self-pay | Admitting: Anesthesiology

## 2017-02-06 ENCOUNTER — Encounter
Admission: RE | Disposition: A | Discharge status: Home / Self Care | Payer: Self-pay | Source: Ambulatory Visit | Attending: Gastroenterology

## 2017-02-06 ENCOUNTER — Ambulatory Visit
Admission: RE | Admit: 2017-02-06 | Discharge: 2017-02-06 | Disposition: A | Discharge status: Home / Self Care | Payer: BLUE CROSS/BLUE SHIELD | Source: Ambulatory Visit | Attending: Gastroenterology | Admitting: Gastroenterology

## 2017-02-06 ENCOUNTER — Other Ambulatory Visit
Admission: RE | Admit: 2017-02-06 | Discharge: 2017-02-06 | Disposition: A | Discharge status: Home / Self Care | Payer: BLUE CROSS/BLUE SHIELD | Source: Ambulatory Visit | Attending: Gastroenterology | Admitting: Gastroenterology

## 2017-02-06 DIAGNOSIS — Z803 Family history of malignant neoplasm of breast: Secondary | ICD-10-CM | POA: Diagnosis not present

## 2017-02-06 DIAGNOSIS — Z86010 Personal history of colonic polyps: Secondary | ICD-10-CM | POA: Diagnosis not present

## 2017-02-06 DIAGNOSIS — K5730 Diverticulosis of large intestine without perforation or abscess without bleeding: Secondary | ICD-10-CM | POA: Insufficient documentation

## 2017-02-06 DIAGNOSIS — K6289 Other specified diseases of anus and rectum: Secondary | ICD-10-CM | POA: Insufficient documentation

## 2017-02-06 DIAGNOSIS — K625 Hemorrhage of anus and rectum: Secondary | ICD-10-CM | POA: Diagnosis not present

## 2017-02-06 DIAGNOSIS — Z800 Family history of malignant neoplasm of digestive organs: Secondary | ICD-10-CM | POA: Diagnosis not present

## 2017-02-06 DIAGNOSIS — Z8042 Family history of malignant neoplasm of prostate: Secondary | ICD-10-CM | POA: Insufficient documentation

## 2017-02-06 DIAGNOSIS — Z79810 Long term (current) use of selective estrogen receptor modulators (SERMs): Secondary | ICD-10-CM | POA: Diagnosis not present

## 2017-02-06 DIAGNOSIS — Z853 Personal history of malignant neoplasm of breast: Secondary | ICD-10-CM | POA: Diagnosis not present

## 2017-02-06 DIAGNOSIS — Z8719 Personal history of other diseases of the digestive system: Secondary | ICD-10-CM | POA: Diagnosis not present

## 2017-02-06 DIAGNOSIS — I10 Essential (primary) hypertension: Secondary | ICD-10-CM | POA: Diagnosis not present

## 2017-02-06 DIAGNOSIS — K529 Noninfective gastroenteritis and colitis, unspecified: Secondary | ICD-10-CM | POA: Diagnosis not present

## 2017-02-06 DIAGNOSIS — Z79899 Other long term (current) drug therapy: Secondary | ICD-10-CM | POA: Diagnosis not present

## 2017-02-06 HISTORY — PX: COLONOSCOPY WITH PROPOFOL: SHX5780

## 2017-02-06 SURGERY — COLONOSCOPY WITH PROPOFOL
Anesthesia: General

## 2017-02-06 MED ORDER — MIDAZOLAM HCL 2 MG/2ML IJ SOLN
INTRAMUSCULAR | Status: DC | PRN
  Administered 2017-02-06: 1 mg via INTRAVENOUS

## 2017-02-06 MED ORDER — EPHEDRINE SULFATE 50 MG/ML IJ SOLN
INTRAMUSCULAR | Status: DC | PRN
  Administered 2017-02-06: 10 mg via INTRAVENOUS

## 2017-02-06 MED ORDER — SODIUM CHLORIDE 0.9 % IV SOLN
INTRAVENOUS | Status: DC
  Administered 2017-02-06: 14:00:00 via INTRAVENOUS

## 2017-02-06 MED ORDER — FENTANYL CITRATE (PF) 100 MCG/2ML IJ SOLN
INTRAMUSCULAR | Status: DC | PRN
  Administered 2017-02-06: 50 ug via INTRAVENOUS

## 2017-02-06 MED ORDER — PROPOFOL 500 MG/50ML IV EMUL
INTRAVENOUS | Status: DC | PRN
  Administered 2017-02-06: 160 ug/kg/min via INTRAVENOUS

## 2017-02-06 MED ORDER — SODIUM CHLORIDE 0.9 % IV SOLN: INTRAVENOUS | Status: DC

## 2017-02-06 MED ORDER — MIDAZOLAM HCL 2 MG/2ML IJ SOLN
INTRAMUSCULAR | Status: AC
  Filled 2017-02-06: qty 2

## 2017-02-06 MED ORDER — PROPOFOL 10 MG/ML IV BOLUS
INTRAVENOUS | Status: DC | PRN
  Administered 2017-02-06 (×2): 30 mg via INTRAVENOUS
  Administered 2017-02-06: 40 mg via INTRAVENOUS

## 2017-02-06 MED ORDER — FENTANYL CITRATE (PF) 100 MCG/2ML IJ SOLN
INTRAMUSCULAR | Status: AC
  Filled 2017-02-06: qty 2

## 2017-02-06 MED ORDER — PROPOFOL 500 MG/50ML IV EMUL
INTRAVENOUS | Status: AC
  Filled 2017-02-06: qty 50

## 2017-02-06 NOTE — Anesthesia Postprocedure Evaluation (Signed)
Anesthesia Post Note  Patient: Kelly Munoz  Procedure(s) Performed: COLONOSCOPY WITH PROPOFOL (N/A )  Patient location during evaluation: Endoscopy Anesthesia Type: General Level of consciousness: awake and alert Pain management: pain level controlled Vital Signs Assessment: post-procedure vital signs reviewed and stable Respiratory status: spontaneous breathing, nonlabored ventilation, respiratory function stable and patient connected to nasal cannula oxygen Cardiovascular status: blood pressure returned to baseline and stable Postop Assessment: no apparent nausea or vomiting Anesthetic complications: no     Last Vitals:  Vitals:   02/06/17 1433 02/06/17 1443  BP: 125/74 120/71  Pulse: 96 93  Resp: 16 18  Temp:    SpO2: 100% 100%    Last Pain:  Vitals:   02/06/17 1423  TempSrc: Tympanic                 Ramir Malerba S

## 2017-02-06 NOTE — Anesthesia Procedure Notes (Signed)
Date/Time: 02/06/2017 1:36 PM Performed by: Johnna Acosta, CRNA Pre-anesthesia Checklist: Patient identified, Emergency Drugs available, Suction available, Patient being monitored and Timeout performed Patient Re-evaluated:Patient Re-evaluated prior to induction Oxygen Delivery Method: Nasal cannula Preoxygenation: Pre-oxygenation with 100% oxygen

## 2017-02-06 NOTE — H&P (Signed)
Outpatient short stay form Pre-procedure 02/06/2017 1:20 PM Lollie Sails MD  Primary Physician: Dr. Glendon Axe  Reason for visit:  Colonoscopy  History of present illness:  Patient is a 57 year old female presenting today as above. She has personal history of Crohn's disease diagnosed probably in her 29s. She has been having colonoscopies since her early 9s. She tolerated her prep well. She takes no aspirin or blood thinning agent.  She has been found have abnormal transaminases 4 course of time of at least 4 months. She discuss this further with me today and I'll see her in follow-up in the office.    Current Facility-Administered Medications:  .  0.9 %  sodium chloride infusion, , Intravenous, Continuous, Lollie Sails, MD  Medications Prior to Admission  Medication Sig Dispense Refill Last Dose  . Cholecalciferol (VITAMIN D3) 5000 units CAPS Take 5,000 Units by mouth daily.   02/05/2017 at Unknown time  . ibuprofen (ADVIL,MOTRIN) 600 MG tablet Take 1 tablet (600 mg total) by mouth every 6 (six) hours as needed. 30 tablet 0 Past Month at Unknown time  . LIALDA 1.2 g EC tablet Take 2.4 g by mouth daily with breakfast.    02/05/2017 at Unknown time  . metoprolol succinate (TOPROL-XL) 25 MG 24 hr tablet Take 25 mg by mouth daily.   02/06/2017 at Unknown time  . ramipril (ALTACE) 10 MG capsule Take 20 mg by mouth daily.    02/06/2017 at Unknown time  . tamoxifen (NOLVADEX) 20 MG tablet TAKE ONE TABLET BY MOUTH EVERY EVENING 90 tablet 1 02/05/2017 at Unknown time  . acetaminophen (TYLENOL) 325 MG tablet Take 650 mg by mouth every 6 (six) hours as needed for moderate pain or headache.   Not Taking at Unknown time  . Carboxymethylcellulose Sodium (LUBRICATING PLUS EYE DROPS OP) Apply 1 drop to eye daily as needed (dry eyes).   Not Taking at Unknown time  . cyclobenzaprine (FLEXERIL) 5 MG tablet Take 1 tablet (5 mg total) by mouth 3 (three) times daily as needed. (Patient not taking:  Reported on 02/06/2017) 20 tablet 0 Not Taking  . fluticasone (FLONASE) 50 MCG/ACT nasal spray Place 1-2 sprays into both nostrils daily as needed for allergies or rhinitis.   Not Taking at Unknown time  . pravastatin (PRAVACHOL) 20 MG tablet Take 20 mg by mouth at bedtime.    08/06/2016  . predniSONE (STERAPRED UNI-PAK 21 TAB) 10 MG (21) TBPK tablet Take 6 tablets on day 1. Take 5 tablets on day 2. Take 4 tablets on day 3. Take 3 tablets on day 4. Take 2 tablets on day 5. Take 1 tablets on day 6. (Patient not taking: Reported on 08/08/2016) 21 tablet 0 Not Taking     Allergies  Allergen Reactions  . Zithromax [Azithromycin] Diarrhea  . Clindamycin/Lincomycin Rash  . Erythromycin Rash     Past Medical History:  Diagnosis Date  . Breast cancer ([place])   . Breast cancer, right ([place]) 06/08/2011  . Family history of breast cancer   . Family history of colon cancer   . Family history of prostate cancer   . History of IBS   . History of kidney stones   . Hypertension   . 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    Planned proceedures: Colonoscopy and indicated procedures. I have discussed the risks benefits and complications of procedures to include not limited to bleeding, infection, perforation and the risk of sedation and the patient wishes to proceed.    Lollie Sails, MD Gastroenterology 02/06/2017  1:20 PM

## 2017-02-06 NOTE — Transfer of Care (Signed)
Immediate Anesthesia Transfer of Care Note  Patient: Kelly Munoz  Procedure(s) Performed: COLONOSCOPY WITH PROPOFOL (N/A )  Patient Location: PACU  Anesthesia Type:General  Level of Consciousness: awake  Airway & Oxygen Therapy: Patient Spontanous Breathing and Patient connected to nasal cannula oxygen  Post-op Assessment: Report given to RN and Post -op Vital signs reviewed and stable  Post vital signs: Reviewed and stable  Last Vitals:  Vitals:   02/06/17 1247 02/06/17 1423  BP: 135/78 115/73  Pulse: 93 (!) 101  Resp: 18 17  Temp: 37.1 C (!) 36 C  SpO2: 99% 100%    Last Pain:  Vitals:   02/06/17 1423  TempSrc: Tympanic         Complications: No apparent anesthesia complications

## 2017-02-06 NOTE — Op Note (Signed)
[facility]astroenterology Patient Name: Kelly Munoz Procedure Date: 02/06/2017 1:33 PM MRN: [id number] Account #: [id number] Date of Birth: 1960/05/14 Admit Type: Outpatient Age: 57 Room: [facility] ENDO ROOM 3 Gender: Female Note Status: Finalized Procedure:            Colonoscopy Indications:          Rectal bleeding, Personal history of colonic polyps,                        Personal history of Crohn's disease Providers:            Lollie Sails, MD Referring MD:         Glendon Axe (Referring MD) Medicines:            Monitored Anesthesia Care Complications:        No immediate complications. Procedure:            Pre-Anesthesia Assessment:                       - ASA Grade Assessment: III - A patient with severe                        systemic disease.                       After obtaining informed consent, the colonoscope was                        passed under direct vision. Throughout the procedure,                        the patient's blood pressure, pulse, and oxygen                        saturations were monitored continuously. The Olympus                        PCF-H180AL colonoscope ( S#: [id number] ) was introduced                        through the anus and advanced to the the terminal                        ileum. The quality of the bowel preparation was good. Findings:      A few small-mouthed diverticula were found in the sigmoid colon,       descending colon and transverse colon.      A less than 1 mm polyp was found in the rectum. The polyp was sessile.       The polyp was removed with a cold biopsy forceps. Resection and       retrieval were complete.      The exam was otherwise normal throughout the examined colon.      Biopsies for histology were taken with a cold forceps from the cecum,       ascending colon, transverse colon, descending colon, sigmoid colon and       rectum for evaluation of microscopic colitis.      The terminal  ileum appeared normal.      The digital rectal exam was normal.      The perianal examination was normal.  A 2 mm polyp was found in the ileocecal valve. The polyp was sessile.       Biopsies for histology were taken with a cold forceps for evaluation of       microscopic colitis. Impression:           - Diverticulosis in the sigmoid colon, in the                        descending colon and in the transverse colon.                       - One less than 1 mm polyp in the rectum, removed with                        a cold biopsy forceps. Resected and retrieved.                       - The examined portion of the ileum was normal.                       - Biopsies were taken with a cold forceps from the                        cecum, ascending colon, transverse colon, descending                        colon, sigmoid colon and rectum for evaluation of                        microscopic colitis. Recommendation:       - Discharge patient to home.                       - Await pathology results.                       - Return to GI clinic in 2-3 weeks.                       - Discharge patient to home.                       - IBD panel.- Check today. Procedure Code(s):    --- Professional ---                       [phone], Colonoscopy, flexible; with biopsy, single or                        multiple Diagnosis Code(s):    --- Professional ---                       K62.1, Rectal polyp                       K62.5, Hemorrhage of anus and rectum                       Z86.010, Personal history of colonic polyps                       Z87.19, Personal history of other  diseases of the                        digestive system                       K57.30, Diverticulosis of large intestine without                        perforation or abscess without bleeding CPT copyright 2016 [employer]. All rights reserved. The codes documented in this report are preliminary and upon coder review  may  be revised to meet current compliance requirements. Lollie Sails, MD 02/06/2017 2:19:58 PM This report has been signed electronically. Number of Addenda: 0 Note Initiated On: 02/06/2017 1:33 PM Scope Withdrawal Time: 0 hours 17 minutes 0 seconds  Total Procedure Duration: 0 hours 29 minutes 34 seconds       [facility]

## 2017-02-06 NOTE — Anesthesia Post-op Follow-up Note (Signed)
Anesthesia QCDR form completed.        

## 2017-02-06 NOTE — Anesthesia Preprocedure Evaluation (Signed)
Anesthesia Evaluation  Patient identified by MRN, date of birth, ID band Patient awake    Reviewed: Allergy & Precautions, NPO status , Patient's Chart, lab work & pertinent test results, reviewed documented beta blocker date and time   Airway Mallampati: II  TM Distance: >3 FB     Dental  (+) Chipped   Pulmonary           Cardiovascular hypertension, Pt. on medications      Neuro/Psych  Neuromuscular disease    GI/Hepatic   Endo/Other    Renal/GU      Musculoskeletal   Abdominal   Peds  Hematology   Anesthesia Other Findings   Reproductive/Obstetrics                             Anesthesia Physical Anesthesia Plan  ASA: II  Anesthesia Plan: General   Post-op Pain Management:    Induction: Intravenous  PONV Risk Score and Plan:   Airway Management Planned:   Additional Equipment:   Intra-op Plan:   Post-operative Plan:   Informed Consent: I have reviewed the patients History and Physical, chart, labs and discussed the procedure including the risks, benefits and alternatives for the proposed anesthesia with the patient or authorized representative who has indicated his/her understanding and acceptance.     Plan Discussed with: CRNA  Anesthesia Plan Comments:         Anesthesia Quick Evaluation

## 2017-02-07 ENCOUNTER — Encounter: Payer: Self-pay | Admitting: Gastroenterology

## 2017-02-08 LAB — SURGICAL PATHOLOGY

## 2017-02-09 ENCOUNTER — Encounter: Payer: Self-pay | Admitting: Hematology and Oncology

## 2017-02-09 ENCOUNTER — Inpatient Hospital Stay: Payer: BLUE CROSS/BLUE SHIELD | Attending: Hematology and Oncology | Admitting: Hematology and Oncology

## 2017-02-09 VITALS — BP 130/80 | HR 98 | Temp 98.3°F | Resp 20 | Wt 158.3 lb

## 2017-02-09 DIAGNOSIS — R945 Abnormal results of liver function studies: Secondary | ICD-10-CM | POA: Diagnosis not present

## 2017-02-09 DIAGNOSIS — C773 Secondary and unspecified malignant neoplasm of axilla and upper limb lymph nodes: Secondary | ICD-10-CM | POA: Insufficient documentation

## 2017-02-09 DIAGNOSIS — Z1501 Genetic susceptibility to malignant neoplasm of breast: Secondary | ICD-10-CM | POA: Diagnosis not present

## 2017-02-09 DIAGNOSIS — R7989 Other specified abnormal findings of blood chemistry: Secondary | ICD-10-CM | POA: Diagnosis not present

## 2017-02-09 DIAGNOSIS — Z79810 Long term (current) use of selective estrogen receptor modulators (SERMs): Secondary | ICD-10-CM | POA: Diagnosis not present

## 2017-02-09 DIAGNOSIS — M8580 Other specified disorders of bone density and structure, unspecified site: Secondary | ICD-10-CM | POA: Insufficient documentation

## 2017-02-09 DIAGNOSIS — Z170 Estrogen receptor positive status [ER+]: Secondary | ICD-10-CM

## 2017-02-09 DIAGNOSIS — C50911 Malignant neoplasm of unspecified site of right female breast: Secondary | ICD-10-CM | POA: Insufficient documentation

## 2017-02-09 DIAGNOSIS — K760 Fatty (change of) liver, not elsewhere classified: Secondary | ICD-10-CM | POA: Diagnosis not present

## 2017-02-09 DIAGNOSIS — Z1509 Genetic susceptibility to other malignant neoplasm: Secondary | ICD-10-CM

## 2017-02-09 DIAGNOSIS — R251 Tremor, unspecified: Secondary | ICD-10-CM | POA: Diagnosis not present

## 2017-02-09 NOTE — Progress Notes (Signed)
[facility] day:  02/09/17  Chief Complaint: Kelly Munoz is a 57 y.o. female with stage IIB Her2/neu + right breast cancer and a BRCA2 mutation who is seen for 6 month assessment.  HPI:  The patient was last seen in medical oncology clinic on 08/08/2016.  At that time, she had issues with right S1 nerve root compression.  She was tolerating tamoxifen.  Exam was stable.  LFTs were mildly elevated.  Abdomen CT on 10/21/2016 revealed several of the tiny hypodense lesions in the liver were slightly larger or more conspicuous on today' s exam than on the prior exam, and conceivably be lesion posteriorly in the right hepatic lobe was new compared to the prior exam, although it may have simply been obscured by the more striking degree of hepatic steatosis on that exam. The pulmonary nodule was stable and benign and the mildly prominent right gastric lymph nodes are chronically stable.  Bilateral breast MRI on 01/30/2017 revealed a stable MRI of the breasts with no interval findings suspicious for malignancy in either breast.  Recommendation was bilateral diagnostic mammogram in 6 months when due and bilateral breast MRI in 1 year.  Patient had a routine colonoscopy on 02/06/2017 that demonstrated diverticulosis. A < 1 mm sessile polyp was removed from the rectum. A 2 mm sessile polyp was removed from the ileocecal valve. Pathology on the biopsied lesions revealed focal mild ileitis. Samples negative for dysplasia and malignancy.   During the interim, patient is doing well overall. Her only complaint is "indiscriminate tremors" in her LEFT hand and and lower leg. She notes a previously history of vertiginous symptoms that developed over the summer. She states, "when the dizziness went away, then tremors started". Patient notes that she has an appointment to discuss these concerns with her PCP soon.   She does not verbalize any breast concerns today. She  denies B symptoms and significant interval infections. Patient continues on tamoxifen.    Past Medical History:  Diagnosis Date  . Breast cancer ([place])   . Breast cancer, right ([place]) 06/08/2011  . Family history of breast cancer   . Family history of colon cancer   . Family history of prostate cancer   . History of IBS   . History of kidney stones   . Hypertension   . 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[facility])     Past Surgical History:  Procedure Laterality Date  . ABDOMINAL HYSTERECTOMY    . BREAST BIOPSY Right 2013   lumpectomy chem and rad  . COLONOSCOPY WITH PROPOFOL N/A 02/06/2017   Procedure: COLONOSCOPY WITH PROPOFOL;  Surgeon: Lollie Sails, MD;  Location: [facility]OSCOPY;  Service: Endoscopy;  Laterality: N/A;  . CYSTOSCOPY N/A 11/09/2015   Procedure: CYSTOSCOPY;  Surgeon: Honor Loh Ward, MD;  Location: [facility] ORS;  Service: Gynecology;  Laterality: N/A;  . parotid stone      Removal  . REDUCTION MAMMAPLASTY Bilateral 02/2013  . TONSILLECTOMY      Family History  Problem Relation Age of Onset  . Breast cancer Mother 54  . Kidney cancer Father        dx in his mid 30s  . Prostate cancer Maternal Uncle        dx in mid 8s  . Stomach cancer Paternal Aunt        dx in mid 62s  . Diabetes Maternal Grandmother   . Heart disease Maternal Grandmother   . Prostate cancer  Maternal Grandfather   . Heart disease Paternal Grandmother   . Heart attack Paternal Grandfather   . Prostate cancer Maternal Uncle        dx in mid 20s  . Prostate cancer Maternal Uncle        dx in mid 36s  . Colon cancer Maternal Uncle        dx in mid 16s  . Colon cancer Cousin        paternal first cousin dx in his mid 40s  . Colon cancer Cousin        paternal first cousin dx in his mid 40s    Social History:  reports that  has never smoked. she has never used smokeless tobacco. She reports that she does not drink alcohol or use drugs.  She is from [place].  She moved to [place] at the end of 03/2015 to be with her family.  Her parents live in [place].  She lives with her husband.  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  She has 2 daughters (23 year-old in graduate school, 56 year-old in [place]).  Her husband had an MI and was hospitalized in [place].  Her husband's name is Al.  The patient is alone today.  Allergies:  Allergies  Allergen Reactions  . Zithromax [Azithromycin] Diarrhea  . Clindamycin/Lincomycin Rash  . Erythromycin Rash    Current Medications: Current Outpatient Medications  Medication Sig Dispense Refill  . acetaminophen (TYLENOL) 325 MG tablet Take 650 mg by mouth every 6 (six) hours as needed for moderate pain or headache.    . Carboxymethylcellulose Sodium (LUBRICATING PLUS EYE DROPS OP) Apply 1 drop to eye daily as needed (dry eyes).    . Cholecalciferol (VITAMIN D3) 5000 units CAPS Take 5,000 Units by mouth daily.    Marland Kitchen FISH OIL-KRILL OIL PO Take 1 tablet by mouth daily.    . fluticasone (FLONASE) 50 MCG/ACT nasal spray Place 1-2 sprays into both nostrils daily as needed for allergies or rhinitis.    Marland Kitchen ibuprofen (ADVIL,MOTRIN) 600 MG tablet Take 1 tablet (600 mg total) by mouth every 6 (six) hours as needed. 30 tablet 0  . LIALDA 1.2 g EC tablet Take 2.4 g by mouth daily with breakfast.     . metoprolol succinate (TOPROL-XL) 25 MG 24 hr tablet Take 25 mg by mouth daily.    . ramipril (ALTACE) 10 MG capsule Take 20 mg by mouth daily.     . tamoxifen (NOLVADEX) 20 MG tablet TAKE ONE TABLET BY MOUTH EVERY EVENING 90 tablet 1  . pravastatin (PRAVACHOL) 20 MG tablet Take 20 mg by mouth at bedtime.      No current facility-administered medications for this visit.     Review of Systems:  GENERAL:  Feels good.  No fevers, sweats or weight loss.  Weight up 2 pounds. PERFORMANCE STATUS (ECOG):  0 HEENT:  No visual changes, runny nose, sore throat, mouth sores or tenderness. Lungs: No  shortness of breath or cough.  No hemoptysis. Cardiac:  No chest pain, palpitations, orthopnea, or PND. GI:  Irritable bowel.  No nausea, vomiting, diarrhea, constipation, melena or hematochezia.  Interval colonoscopy. GU:  No urgency, frequency, dysuria, or hematuria. s/p TAH/BSO (see HPI). Musculoskeletal: Sciatica.  Lumbar spine MRI notes compression of right S1 (see HPI).  No joint pain.  No muscle tenderness. Extremities:  No pain or swelling. Skin:  No rashes or  skin changes. Neuro:  Chemo brain.  Neuropathy in legs.  Tremors left hand and leg (see HPI).  No headache, numbness or weakness, balance or coordination issues. Endocrine:  No diabetes, thyroid issues.  No change in hot flashes or night sweats. Psych:  No mood changes, depression or anxiety. Pain:  No focal pain. Review of systems:  All other systems reviewed and found to be negative.  Physical Exam: Blood pressure 130/80, pulse 98, temperature 98.3 F (36.8 C), temperature source Tympanic, resp. rate 20, weight 158 lb 5 oz (71.8 kg). GENERAL:  Well developed, well nourished, woman sitting comfortably in the exam room in no acute distress. MENTAL STATUS:  Alert and oriented to person, place and time. HEAD:  Long curly brown hair.  Normocephalic, atraumatic, face symmetric, no Cushingoid features. EYES: Blue eyes.  Pupils equal round and reactive to light and accomodation.  No conjunctivitis or scleral icterus. ENT:  Oropharynx clear without lesion.  Tongue normal. Mucous membranes moist.  RESPIRATORY:  Clear to auscultation without rales, wheezes or rhonchi. CARDIOVASCULAR:  Regular rate and rhythm without murmur, rub or gallop. BREAST:  Right breast with inferior scarring s/p surgery and radiation.  No masses, skin changes or nipple discharge.  Left breast medial scarring s/p breast reduction without masses, skin changes or nipple discharge.  ABDOMEN:  Soft, non-tender, with active bowel sounds, and no hepatosplenomegaly.  No  masses. SKIN:  No rashes, ulcers or lesions. EXTREMITIES: No edema, no skin discoloration or tenderness.  No palpable cords. LYMPH NODES: No palpable cervical, supraclavicular, axillary or inguinal adenopathy  NEUROLOGICAL: Unremarkable. PSYCH:  Appropriate.   [facility] Labs:    02/04/2016:  Hematocrit 39.3, hemoglobin 12.9, MCV 89, platelets 232,000, white count 7400 with an ANC of 4600.  Comprehensive metabolic panel included a sodium of 140, potassium of 4.3, creatinine 0.65, bilirubin 0.3, alkaline phosphatase 59, SGOT 36, SGPT 33. CA 19-9 was 21. CA 27-29 was 19.4.   08/01/2016:  Hematocrit of 41.7, hemoglobin 13.2, MCV 90, platelets 237,000, white count 7600 with an ANC of 4000. Comprehensive metabolic panel included a sodium of 41, potassium 4.3, creatinine 0.67, bilirubin 0.2, alkaline phosphatase 56, AST 49,and  ALT 45. CA19-9 was 29. CA27.29 was 17.2. 09/14/2016:  AST 65, ALT 52, bilirubin 0.3, alkaline phosphatase 57, hepatitis B core antibody negative, hepatitis B surface antigen negative, hepatitis C antibody negative. 01/25/2017:  Hematocrit 40.8, hemoglobin 13.3, MCV 90, platelets 227,000, white count 6900.  Comprehensive metabolic panel included a sodium of 140, potassium of 4.2, creatinine 0.62, bilirubin 0.4, alkaline phosphatase 56, SGOT 68, SGPT 70. CA 19-9 was 23. CA 27-29 was 16.3.   No visits with results within 3 Day(s) from this visit.  Latest known visit with results is:  Admission on 02/06/2017, Discharged on 02/06/2017  Component Date Value Ref Range Status  . SURGICAL PATHOLOGY 02/06/2017    Final                   Value:Surgical Pathology CASE: [id number] PATIENT: Kelly Munoz Surgical Pathology Report     SPECIMEN SUBMITTED: A. Colon, ileocecal valve, granular mucosa; cbx B. Ileum, random terminal; cbx C. Colon, random cecum; cbx D. Colon, random ascending; cbx E. Colon, random transverse; cbx F. Colon, random descending; cbx G. Colon, random  sigmoid; cbx H. Rectum, random; cbx I. Rectum polyp; cbx  CLINICAL HISTORY: None provided  PRE-OPERATIVE DIAGNOSIS: Rectal bleed, Crohn's  POST-OPERATIVE DIAGNOSIS: External hemorrhoids; ileocecal valve polyp; diverticulosis; polyps     DIAGNOSIS: A.  COLON,  GRANULAR MUCOSA AT ILEOCECAL VALVE; COLD BIOPSY: - FOCAL MILD ACTIVE ILEITIS. - NEGATIVE FOR DYSPLASIA AND MALIGNANCY.  B.  RANDOM TERMINAL ILEUM; COLD BIOPSY: - FOCAL MILD ACTIVE ILEITIS. - NEGATIVE FOR DYSPLASIA AND MALIGNANCY.  C.  COLON, RANDOM CECUM; COLD BIOPSY: - NO PATHOLOGIC CHANGE.  D.  COLON, RANDOM ASCENDING; COLD BIOPSY: - NO PATHOLOGIC CHANGE.                           E.  RANDOM TRANSVERSE COLON; COLD BIOPSY: - NO PATHOLOGIC CHANGE.  F.  RANDOM DESCENDING COLON; COLD BIOPSY: - NO PATHOLOGIC CHANGE.  G.  RANDOM SIGMOID COLON; COLD BIOPSY: - MILD CRYPT IRREGULARITIES. - NEGATIVE FOR DYSPLASIA AND MALIGNANCY.  H.  RANDOM RECTUM; COLD BIOPSY: - MILD CRYPT IRREGULARITIES. - NEGATIVE FOR DYSPLASIA AND MALIGNANCY.  I.  RECTUM POLYP; COLD BIOPSY: - POLYPOID COLONIC MUCOSA NEGATIVE FOR DYSPLASIA AND MALIGNANCY.   GROSS DESCRIPTION:  A. Labeled: C BX ileocecal valve granular mucosa  Tissue fragment(s): 1  Size: 0.3 cm  Description: in formalin, tan fragment  Entirely submitted in 1 cassette(s).  B. Labeled: C BX random terminal ileum  Tissue fragment(s): 2  Size: 0.4 cm  Description: in formalin, tan-brown fragment  Entirely submitted in 1 cassette(s).  C. Labeled: C BX random cecum  Tissue fragment(s): 3  Size: 0.2-0.4 cm  Description: in formalin, tan fragments  Entirely submitted in 1 cassette(s).  D. Lab                         eled: C BX random ascending colon  Tissue fragment(s): 3  Size: less than 0.1-0.3 cm  Description:  in formalin, tan fragments  Entirely submitted in 1 cassette(s).  E. Labeled: C BX random transverse colon  Tissue fragment(s):  3  Size: 0.1-0.4 cm  Description:  in formalin, tan fragments  Entirely submitted in 1 cassette(s).  F. Labeled: C BX random descending colon  Tissue fragment(s): 4  Size: less than 0.1-0.2 cm  Description:  in formalin, tan fragments  Entirely submitted in 1 cassette(s).  G. Labeled: C BX random sigmoid colon  Tissue fragment(s): 3  Size: 0.2-0.4 cm  Description:  in formalin, tan-brown fragments  Entirely submitted in one cassette(s).  H. Labeled: C BX random rectum  Tissue fragment(s): 2  Size: 0.4 cm  Description:  in formalin, tan-brown fragments  Entirely submitted in one cassette(s).  I. Labeled: C BX rectal polyp  Tissue fragment(s): 1  Size: 0.3 cm  Description: in formalin, tan fragment  Entirel                         y submitted in 1 cassette(s).  Final Diagnosis performed by Delorse Lek, MD.  Electronically signed 02/08/2017 1:37:45PM    The electronic signature indicates that the named Attending Pathologist has evaluated the specimen  Technical component performed at [facility], [address], [place], [place] [address] Lab: [phone] Dir: Rush Farmer, MD, MMM  Professional component performed at [facility], [facility], [address], [place], [place] [address] Lab: [phone] Dir: Dellia Nims. Rubinas, MD      Assessment:  Kelly Munoz is a 57 y.o. female with stage IIB Her2/neu + right breast cancer s/p right partial mastectomy and sentinel lymph node biopsy on 06/08/2011.  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  There was solid, cribriform and comedo DCIS  with necrosis.  One of 5 lymph nodes were positive.  There was a macro-metastasis in one intramammary lymph node.  Tumor was ER positive (88.8%), PR positive (90.93%), and Her2/neu 3+.  Ki-67 was 48%.   Pathologic stage was T2N1aM0.   My Risk Genetic testing on 07/13/2015 revealed a BRCA2 mutation (c.5946del). 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  She received 6 cycles of TCH chemotherapy beginning 07/11/2011.  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  She completed 1 year of adjuvant Herceptin.  At one point, her EF decreased (no records available).  EF returned to normal per patient report (last echo was in 2014).  She received radiation (completed before the end of 2013).  She began tamoxifen in 01/2012.    CA27.29 has been followed: 13.2 on 03/04/2015, 21.2 on 08/14/2015, 19.4 on 02/04/2016, 17.2 on 08/01/2016, and 16.3 on 01/25/2017.  Bilateral breast MRI on 01/30/2017 revealed no evidence of malignancy.  Bilateral mammogram on 08/03/2016 revealed  No evidence of malignancy.    Bone density on 03/23/2016 revealed osteopenia with a T-score of -1.4 in the right femoral neck.  She underwent total laparoscopic hysterectomy and bilateral oophorectomy on 11/09/2015.  Pathology revealed no evidence of malignancy.  She has mildly increased liver function tests.  She has fatty liver.  She was on a statin drug.  Hepatitis B and C serologies were negative on 09/14/2016.  She denies any new medication or herbal product.    Symptomatically, patient doing well.  She complains of tremors in her LEFT hand and lower leg.  She is tolerating tamoxifen.  Exam is stable.  Plan: 1.  Review [facility] labs. 2.  Review interval breast MRI.  No evidence of malignancy.  Continue yearly breast MRI and mammogram alternating every 6 months. 3.  Review interval abdominal CT.  Hypodense lesions slightly more conspicuous.  Review discussion with radiology.  Stable imaging for 3.5 years.  Discuss reimaging if LFTS increase or symptoms. 4.  Schedule bilateral mammogram 08/03/2017. 5.  Continue tamoxifen as previously prescribed. 6.  Discuss surgical intervention. Patient makes mention of the fact that she is considering a bilateral mastectomy, however has not made the final  decision. 7.  Discussed BCI testing to assess benefit of extended adjuvant hormonal therapy for an additional 5 years.  8.  Discuss tremors.  Consider neurology evaluation or head imaging.  Patient has upcoming appointment to further discuss. 9.  [facility] slip provided for interval labs (CBC with diff, CMP, CA27.29) 10.  RTC in 6 months for MD assessment and review of [facility] labs.   Honor Loh, NP  02/09/2017, 11:40 AM   I saw and evaluated the patient, participating in the key portions of the service and reviewing pertinent diagnostic studies and records.  I reviewed the nurse practitioner's note and agree with the findings and the plan.  The assessment and plan were discussed with the patient.  Multiple questions were asked by the patient and answered.   Nolon Stalls, MD 02/09/2017,11:40 AM

## 2017-02-09 NOTE — Progress Notes (Signed)
Patient states she has developed a tremor in her left arm and leg that is intermittent.  Will see PCP next week and will address it at that time.

## 2017-02-10 ENCOUNTER — Other Ambulatory Visit: Payer: Self-pay | Admitting: Urgent Care

## 2017-02-10 ENCOUNTER — Encounter: Payer: Self-pay | Admitting: Urgent Care

## 2017-02-10 ENCOUNTER — Encounter: Payer: Self-pay | Admitting: Hematology and Oncology

## 2017-02-10 DIAGNOSIS — R7989 Other specified abnormal findings of blood chemistry: Secondary | ICD-10-CM | POA: Insufficient documentation

## 2017-02-10 DIAGNOSIS — R945 Abnormal results of liver function studies: Secondary | ICD-10-CM | POA: Insufficient documentation

## 2017-02-10 DIAGNOSIS — R251 Tremor, unspecified: Secondary | ICD-10-CM

## 2017-02-10 HISTORY — DX: Other specified abnormal findings of blood chemistry: R79.89

## 2017-02-13 LAB — MISCELLANEOUS TEST

## 2017-02-23 ENCOUNTER — Encounter: Payer: Self-pay | Admitting: Hematology and Oncology

## 2017-02-24 ENCOUNTER — Other Ambulatory Visit: Payer: Self-pay | Admitting: Gastroenterology

## 2017-02-24 DIAGNOSIS — R7989 Other specified abnormal findings of blood chemistry: Secondary | ICD-10-CM

## 2017-02-24 DIAGNOSIS — R945 Abnormal results of liver function studies: Principal | ICD-10-CM

## 2017-02-28 ENCOUNTER — Ambulatory Visit
Admission: RE | Admit: 2017-02-28 | Discharge: 2017-02-28 | Disposition: A | Discharge status: Home / Self Care | Payer: BLUE CROSS/BLUE SHIELD | Source: Ambulatory Visit | Attending: Gastroenterology | Admitting: Gastroenterology

## 2017-02-28 DIAGNOSIS — K7689 Other specified diseases of liver: Secondary | ICD-10-CM | POA: Insufficient documentation

## 2017-02-28 DIAGNOSIS — K760 Fatty (change of) liver, not elsewhere classified: Secondary | ICD-10-CM | POA: Diagnosis not present

## 2017-02-28 DIAGNOSIS — R7989 Other specified abnormal findings of blood chemistry: Secondary | ICD-10-CM | POA: Insufficient documentation

## 2017-02-28 DIAGNOSIS — R945 Abnormal results of liver function studies: Secondary | ICD-10-CM

## 2017-03-13 ENCOUNTER — Other Ambulatory Visit: Payer: Self-pay | Admitting: Neurology

## 2017-03-13 DIAGNOSIS — R251 Tremor, unspecified: Secondary | ICD-10-CM

## 2017-03-16 ENCOUNTER — Encounter: Payer: Self-pay | Admitting: Urgent Care

## 2017-03-16 ENCOUNTER — Other Ambulatory Visit: Payer: Self-pay | Admitting: Urgent Care

## 2017-03-16 ENCOUNTER — Other Ambulatory Visit: Payer: Self-pay | Admitting: *Deleted

## 2017-03-16 MED ORDER — TAMOXIFEN CITRATE 20 MG PO TABS: 20.0000 mg | ORAL_TABLET | Freq: Every evening | ORAL | 1 refills | Status: DC

## 2017-03-16 MED ORDER — TAMOXIFEN CITRATE 20 MG PO TABS: 20.0000 mg | ORAL_TABLET | Freq: Every evening | ORAL | 0 refills | Status: DC

## 2017-03-22 ENCOUNTER — Ambulatory Visit
Admission: RE | Admit: 2017-03-22 | Discharge: 2017-03-22 | Disposition: A | Discharge status: Home / Self Care | Payer: BLUE CROSS/BLUE SHIELD | Source: Ambulatory Visit | Attending: Neurology | Admitting: Neurology

## 2017-03-22 DIAGNOSIS — R251 Tremor, unspecified: Secondary | ICD-10-CM | POA: Diagnosis not present

## 2017-03-22 LAB — POCT I-STAT CREATININE: Creatinine, Ser: 0.6 mg/dL (ref 0.44–1.00)

## 2017-03-22 MED ORDER — GADOBENATE DIMEGLUMINE 529 MG/ML IV SOLN
14.0000 mL | Freq: Once | INTRAVENOUS | Status: AC | PRN
  Administered 2017-03-22: 14 mL via INTRAVENOUS

## 2017-05-17 DIAGNOSIS — R7401 Elevation of levels of liver transaminase levels: Secondary | ICD-10-CM | POA: Insufficient documentation

## 2017-07-06 ENCOUNTER — Encounter: Payer: Self-pay | Admitting: Podiatry

## 2017-07-06 ENCOUNTER — Ambulatory Visit: Payer: BLUE CROSS/BLUE SHIELD | Admitting: Podiatry

## 2017-07-06 DIAGNOSIS — L603 Nail dystrophy: Secondary | ICD-10-CM | POA: Diagnosis not present

## 2017-07-06 DIAGNOSIS — B351 Tinea unguium: Secondary | ICD-10-CM | POA: Diagnosis not present

## 2017-07-06 NOTE — Progress Notes (Signed)
This patient presents to the office for evaluation and treatment of her left big toenail.  She says she believes that she has a fungus which has been present for 3-4 years.  She states that her dermatologist diagnosed her as having a fungal nail and treated her with Jublia. She then says she saw Dr. Amalia Hailey who treated her with laser.  She now returns to the office stating she has pain, redness and swelling from the nail plate big toe left foot.  She denies any drainage from the left great toenail area.. She presently is using a mixture of tea  tree oil oregano oil and Coconut oil.  She presents the office today for continued eva and treatment of this nail plate left great toe.  General Appearance  Alert, conversant and in no acute stress.  Vascular  Dorsalis pedis and posterior tibial  pulses are palpable  bilaterally.  Capillary return is within normal limits  bilaterally. Temperature is within normal limits  bilaterally.  Neurologic  Senn-Weinstein monofilament wire test within normal limits  bilaterally. Muscle power within normal limits bilaterally.  Nails disfigured and discolored hallux nail plate left great toe.  This nail plate is unattached at the distal aspect of the nail bed.  Orthopedic  No limitations of motion of motion feet .  No crepitus or effusions noted.  No bony pathology or digital deformities noted.  Skin  normotropic skin with no porokeratosis noted bilaterally.  No signs of infections or ulcers noted.    Onychomycosis left hallux.  A sample of this nail was sent to the lab to determine the presence of fungus.  Patient will be called when the results are received.  In the meantime, I debrided nail from the nail plate from the left hallux and told her to apply her combination oil preparation  until she is recalled to this office.     Gardiner Barefoot DPM

## 2017-07-07 DIAGNOSIS — K602 Anal fissure, unspecified: Secondary | ICD-10-CM | POA: Insufficient documentation

## 2017-07-07 HISTORY — DX: Anal fissure, unspecified: K60.2

## 2017-07-17 ENCOUNTER — Encounter: Payer: Self-pay | Admitting: Hematology and Oncology

## 2017-07-23 ENCOUNTER — Encounter: Payer: Self-pay | Admitting: Podiatry

## 2017-07-26 ENCOUNTER — Encounter: Payer: Self-pay | Admitting: Hematology and Oncology

## 2017-07-26 NOTE — Telephone Encounter (Signed)
Kelly Munoz, please have patient come in for an appt to discuss

## 2017-07-31 ENCOUNTER — Encounter: Payer: Self-pay | Admitting: Podiatry

## 2017-08-01 ENCOUNTER — Encounter: Payer: Self-pay | Admitting: Hematology and Oncology

## 2017-08-08 ENCOUNTER — Ambulatory Visit
Admission: RE | Admit: 2017-08-08 | Discharge: 2017-08-08 | Disposition: A | Discharge status: Home / Self Care | Payer: BLUE CROSS/BLUE SHIELD | Source: Ambulatory Visit | Attending: Urgent Care | Admitting: Urgent Care

## 2017-08-08 ENCOUNTER — Other Ambulatory Visit: Payer: Self-pay | Admitting: Urgent Care

## 2017-08-08 DIAGNOSIS — C50911 Malignant neoplasm of unspecified site of right female breast: Secondary | ICD-10-CM

## 2017-08-08 DIAGNOSIS — Z1501 Genetic susceptibility to malignant neoplasm of breast: Secondary | ICD-10-CM | POA: Diagnosis present

## 2017-08-08 DIAGNOSIS — Z170 Estrogen receptor positive status [ER+]: Secondary | ICD-10-CM | POA: Insufficient documentation

## 2017-08-08 DIAGNOSIS — Z1509 Genetic susceptibility to other malignant neoplasm: Secondary | ICD-10-CM

## 2017-08-10 ENCOUNTER — Inpatient Hospital Stay: Payer: BLUE CROSS/BLUE SHIELD | Attending: Hematology and Oncology | Admitting: Hematology and Oncology

## 2017-08-10 VITALS — BP 125/85 | HR 89 | Temp 99.0°F | Resp 18 | Wt 152.0 lb

## 2017-08-10 DIAGNOSIS — R251 Tremor, unspecified: Secondary | ICD-10-CM | POA: Insufficient documentation

## 2017-08-10 DIAGNOSIS — Z170 Estrogen receptor positive status [ER+]: Secondary | ICD-10-CM

## 2017-08-10 DIAGNOSIS — Z79810 Long term (current) use of selective estrogen receptor modulators (SERMs): Secondary | ICD-10-CM | POA: Diagnosis not present

## 2017-08-10 DIAGNOSIS — Z1509 Genetic susceptibility to other malignant neoplasm: Secondary | ICD-10-CM

## 2017-08-10 DIAGNOSIS — Z1501 Genetic susceptibility to malignant neoplasm of breast: Secondary | ICD-10-CM

## 2017-08-10 DIAGNOSIS — C50911 Malignant neoplasm of unspecified site of right female breast: Secondary | ICD-10-CM | POA: Diagnosis not present

## 2017-08-10 MED ORDER — TAMOXIFEN CITRATE 20 MG PO TABS: 20.0000 mg | ORAL_TABLET | Freq: Every evening | ORAL | 0 refills | Status: DC

## 2017-08-10 NOTE — Progress Notes (Signed)
Patient offers no complaints today. 

## 2017-08-10 NOTE — Progress Notes (Signed)
[facility] day:  08/10/17  Chief Complaint: Kelly Munoz is a 57 y.o. female with stage IIB Her2/neu + right breast cancer and a BRCA2 mutation who is seen for 6 month assessment.  HPI:  The patient was last seen in medical oncology clinic on 02/09/2017.  At that time, she was doing well.  She complained of tremors in her LEFT hand and lower leg.  She was tolerating tamoxifen.  Exam was stable.  She continued tamoxifen.  Bilateral mammogram an left sided ultrasound on 08/08/2017 revealed prior right lumpectomy with no findings of malignancy in either breast.  There was no mammographic or sonographic abnormalities to account for left nipple sensitivity.  During the interim, she has felt good.  She notes her tremors are still there.  She saw Dr. Melrose Nakayama.  Head MRI was negative.  Symptoms may be related to chemotherapy side effect, parkinson's or essential tremors.  She has a follow-up at the end of the month.    She states that she started the hepatitis B series last September.  She denies any breast concerns.  She is in surgical menopause.   Past Medical History:  Diagnosis Date  . Breast cancer ([place])   . Breast cancer, right ([place]) 06/08/2011  . Family history of breast cancer   . Family history of colon cancer   . Family history of prostate cancer   . History of IBS   . History of kidney stones   . Hypertension   . 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[facility])     Past Surgical History:  Procedure Laterality Date  . ABDOMINAL HYSTERECTOMY    . BREAST BIOPSY Right 2013   lumpectomy chem and rad  . COLONOSCOPY WITH PROPOFOL N/A 02/06/2017   Procedure: COLONOSCOPY WITH PROPOFOL;  Surgeon: Lollie Sails, MD;  Location: [facility] ENDOSCOPY;  Service: Endoscopy;  Laterality: N/A;  . CYSTOSCOPY N/A 11/09/2015   Procedure: CYSTOSCOPY;  Surgeon: Honor Loh Ward, MD;  Location: [facility] ORS;  Service: Gynecology;  Laterality: N/A;  . parotid stone       Removal  . REDUCTION MAMMAPLASTY Bilateral 02/2013  . TONSILLECTOMY      Family History  Problem Relation Age of Onset  . Breast cancer Mother 74  . Kidney cancer Father        dx in his mid 79s  . Prostate cancer Maternal Uncle        dx in mid 48s  . Stomach cancer Paternal Aunt        dx in mid 26s  . Diabetes Maternal Grandmother   . Heart disease Maternal Grandmother   . Prostate cancer Maternal Grandfather   . Heart disease Paternal Grandmother   . Heart attack Paternal Grandfather   . Prostate cancer Maternal Uncle        dx in mid 70s  . Prostate cancer Maternal Uncle        dx in mid 56s  . Colon cancer Maternal Uncle        dx in mid 84s  . Colon cancer Cousin        paternal first cousin dx in his mid 63s  . Colon cancer Cousin        paternal first cousin dx in his mid 37s    Social History:  reports that she has never smoked. She has never used smokeless tobacco. She reports that she does not drink alcohol or use drugs.  She is  from [place].  She moved to [place] at the end of 03/2015 to be with her family.  Her parents live in [place].  She lives with her husband.  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  She has 2 daughters (66 year-old in graduate school, 56 year-old in [place]).  Her husband had an MI and was hospitalized in [place].  Her husband's name is Al.  The patient is alone today.  Allergies:  Allergies  Allergen Reactions  . Zithromax [Azithromycin] Diarrhea  . Clindamycin/Lincomycin Rash  . Erythromycin Rash    Current Medications: Current Outpatient Medications  Medication Sig Dispense Refill  . acetaminophen (TYLENOL) 325 MG tablet Take 650 mg by mouth every 6 (six) hours as needed for moderate pain or headache.    . Carboxymethylcellulose Sodium (LUBRICATING PLUS EYE DROPS OP) Apply 1 drop to eye daily as needed (dry eyes).    . Cholecalciferol (VITAMIN D3) 5000 units CAPS  Take 5,000 Units by mouth daily.    . fluticasone (FLONASE) 50 MCG/ACT nasal spray Place 1-2 sprays into both nostrils daily as needed for allergies or rhinitis.    . hydrocortisone 2.5 % cream Apply topically.    Marland Kitchen ibuprofen (ADVIL,MOTRIN) 600 MG tablet Take 1 tablet (600 mg total) by mouth every 6 (six) hours as needed. 30 tablet 0  . LIALDA 1.2 g EC tablet Take 2.4 g by mouth daily with breakfast.     . metoprolol succinate (TOPROL-XL) 25 MG 24 hr tablet Take 25 mg by mouth daily.    . pravastatin (PRAVACHOL) 20 MG tablet Take 20 mg by mouth at bedtime.     . ramipril (ALTACE) 10 MG capsule Take 20 mg by mouth daily.     . tamoxifen (NOLVADEX) 20 MG tablet Take 1 tablet (20 mg total) by mouth every evening. 90 tablet 0  . FISH OIL-KRILL OIL PO Take 1 tablet by mouth daily.    Javier Docker Oil (MAXIMUM RED KRILL) 300 MG CAPS Take by mouth.     No current facility-administered medications for this visit.     Review of Systems:  GENERAL:  Feels good.  Active.  No fevers, sweats.  Weight loss (6 pounds) with change in diet. PERFORMANCE STATUS (ECOG):  0 HEENT:  No visual changes, runny nose, sore throat, mouth sores or tenderness. Lungs: No shortness of breath or cough.  No hemoptysis. Cardiac:  No chest pain, palpitations, orthopnea, or PND. GI:  Irritable bowel.  No nausea, vomiting, diarrhea, constipation, melena or hematochezia. GU:  No urgency, frequency, dysuria, or hematuria. s/p TAH/BSO. Musculoskeletal:  Sciatica.  No back pain.  No joint pain.  No muscle tenderness. Extremities:  No pain or swelling. Skin:  No rashes or skin changes. Neuro:  Chemo brain.  Neuropathy.  Tremors.  No headache, numbness or weakness, balance or coordination issues. Endocrine:  No diabetes, thyroid issues, hot flashes or night sweats. Psych:  No mood changes, depression or anxiety. Pain:  No focal pain. Review of systems:  All other systems reviewed and found to be negative.   Physical Exam: Blood  pressure 125/85, pulse 89, temperature 99 F (37.2 C), temperature source Tympanic, resp. rate 18, weight 152 lb (68.9 kg). GENERAL:  Well developed, well nourished, woman sitting comfortably in the exam room in no acute distress. MENTAL STATUS:  Alert and oriented to person, place and time. HEAD:  Long curly brown hair.  Normocephalic, atraumatic, face  symmetric, no Cushingoid features. EYES:  Brown eyes.  Pupils equal round and reactive to light and accomodation.  No conjunctivitis or scleral icterus. ENT:  Oropharynx clear without lesion.  Tongue normal. Mucous membranes moist.  RESPIRATORY:  Clear to auscultation without rales, wheezes or rhonchi. CARDIOVASCULAR:  Regular rate and rhythm without murmur, rub or gallop. BREAST:  Right breast with post-operative and post-radiation changes.  No masses, skin changes or nipple discharge.  Left breast s/p reduction.  No masses, skin changes or nipple discharge.  ABDOMEN:  Soft, non-tender, with active bowel sounds, and no hepatosplenomegaly.  No masses. SKIN:  No rashes, ulcers or lesions. EXTREMITIES: No edema, no skin discoloration or tenderness.  No palpable cords. LYMPH NODES: No palpable cervical, supraclavicular, axillary or inguinal adenopathy  NEUROLOGICAL:  Mild left upper extremity tremor. PSYCH:  Appropriate.    [facility] Labs:    02/04/2016:  Hematocrit 39.3, hemoglobin 12.9, MCV 89, platelets 232,000, white count 7400 with an ANC of 4600.  Comprehensive metabolic panel included a sodium of 140, potassium of 4.3, creatinine 0.65, bilirubin 0.3, alkaline phosphatase 59, SGOT 36, SGPT 33. CA 19-9 was 21. CA 27-29 was 19.4.   08/01/2016:  Hematocrit of 41.7, hemoglobin 13.2, MCV 90, platelets 237,000, white count 7600 with an ANC of 4000. Comprehensive metabolic panel included a sodium of 41, potassium 4.3, creatinine 0.67, bilirubin 0.2, alkaline phosphatase 56, AST 49,and  ALT 45. CA19-9 was 29. CA27.29 was 17.2. 09/14/2016:  AST 65, ALT  52, bilirubin 0.3, alkaline phosphatase 57, hepatitis B core antibody negative, hepatitis B surface antigen negative, hepatitis C antibody negative. 01/25/2017:  Hematocrit 40.8, hemoglobin 13.3, MCV 90, platelets 227,000, white count 6900.  Comprehensive metabolic panel included a sodium of 140, potassium of 4.2, creatinine 0.62, bilirubin 0.4, alkaline phosphatase 56, SGOT 68, SGPT 70. CA 19-9 was 23. CA 27-29 was 16.3.   No visits with results within 3 Day(s) from this visit.  Latest known visit with results is:  Hospital Outpatient Visit on 03/22/2017  Component Date Value Ref Range Status  . Creatinine, Ser 03/22/2017 0.60  0.44 - 1.00 mg/dL Final    Assessment:  Kelly Munoz is a 57 y.o. female with stage IIB Her2/neu + right breast cancer s/p right partial mastectomy and sentinel lymph node biopsy on 06/08/2011.  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  There was solid, cribriform and comedo DCIS with necrosis.  One of 5 lymph nodes were positive.  There was a macro-metastasis in one intramammary lymph node.  Tumor was ER positive (88.8%), PR positive (90.93%), and Her2/neu 3+.  Ki-67 was 48%.   Pathologic stage was [id number].   My Risk Genetic testing on 07/13/2015 revealed a BRCA2 mutation ([id number]). 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  She received 6 cycles of TCH chemotherapy beginning 07/11/2011.  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  She completed 1 year of adjuvant Herceptin.  At one point, her EF decreased (no records available).  EF returned to normal per patient report (last echo was in 2014).  She received radiation (completed before the end of 2013).  She began tamoxifen in 01/2012.    CA27.29 has been followed: 13.2 on 03/04/2015, 21.2 on 08/14/2015, 19.4 on 02/04/2016, 17.2 on 08/01/2016, and 16.3 on 01/25/2017.  Bilateral breast MRI on 01/30/2017 revealed no evidence  of malignancy.  Bilateral mammogram on 08/03/2016 revealed  No evidence of malignancy.    Bone  density on 03/23/2016 revealed osteopenia with a T-score of -1.4 in the right femoral neck.  She underwent total laparoscopic hysterectomy and bilateral oophorectomy on 11/09/2015.  Pathology revealed no evidence of malignancy.  She has mildly increased liver function tests.  She has fatty liver.  She was on a statin drug.  Hepatitis B and C serologies were negative on 09/14/2016.  She denies any new medication or herbal product.    Symptomatically, she is doing well.  She continues to have tremors.  Exam is stable.  Plan: 1.  Review [facility] labs. 2.  Breast cancer:  Discuss interval mammogram and ultrasound.  Continue yearly breast MRI and mammogram alternating every 6 months.  Schedule breast MRI 02/08/2018.  Continue tamoxifen. 3.  Tremors:  Discuss neurology evaluation- etiology unclear.  Send paraneoplastic panel (doubt). 4.  [facility] slip for paraneoplastic panel. 5.  [facility] slip for labs prior to next visit (CBC with diff, CMP, CA27.29). 6.  RTC in 6 months for MD assessment, review of [facility] labs and breast MRI.   Lequita Asal, MD  08/10/2017, 5:35 PM

## 2017-08-11 ENCOUNTER — Encounter: Payer: Self-pay | Admitting: Hematology and Oncology

## 2017-08-25 ENCOUNTER — Telehealth: Payer: Self-pay | Admitting: *Deleted

## 2017-08-25 NOTE — Telephone Encounter (Signed)
Called patient to inform her that her [employer] are all normal.  Patient appreciative of call.

## 2017-09-11 ENCOUNTER — Encounter: Payer: Self-pay | Admitting: Hematology and Oncology

## 2018-01-09 ENCOUNTER — Encounter: Payer: Self-pay | Admitting: Hematology and Oncology

## 2018-01-10 DIAGNOSIS — G20A1 Parkinson's disease without dyskinesia, without mention of fluctuations: Secondary | ICD-10-CM

## 2018-01-10 DIAGNOSIS — G20 Parkinson's disease: Secondary | ICD-10-CM

## 2018-01-10 HISTORY — DX: Parkinson's disease without dyskinesia, without mention of fluctuations: G20.A1

## 2018-01-10 HISTORY — DX: Parkinson's disease: G20

## 2018-01-17 ENCOUNTER — Encounter: Payer: Self-pay | Admitting: Hematology and Oncology

## 2018-02-06 ENCOUNTER — Encounter: Payer: Self-pay | Admitting: Hematology and Oncology

## 2018-02-08 ENCOUNTER — Ambulatory Visit
Admission: RE | Admit: 2018-02-08 | Discharge: 2018-02-08 | Disposition: A | Discharge status: Home / Self Care | Payer: BLUE CROSS/BLUE SHIELD | Source: Ambulatory Visit | Attending: Urgent Care | Admitting: Urgent Care

## 2018-02-08 DIAGNOSIS — Z1509 Genetic susceptibility to other malignant neoplasm: Secondary | ICD-10-CM

## 2018-02-08 DIAGNOSIS — Z1501 Genetic susceptibility to malignant neoplasm of breast: Secondary | ICD-10-CM

## 2018-02-08 DIAGNOSIS — Z170 Estrogen receptor positive status [ER+]: Principal | ICD-10-CM

## 2018-02-08 DIAGNOSIS — C50911 Malignant neoplasm of unspecified site of right female breast: Secondary | ICD-10-CM

## 2018-02-08 MED ORDER — GADOBUTROL 1 MMOL/ML IV SOLN
7.0000 mL | Freq: Once | INTRAVENOUS | Status: AC | PRN
  Administered 2018-02-08: 7 mL via INTRAVENOUS

## 2018-02-11 ENCOUNTER — Other Ambulatory Visit: Payer: Self-pay | Admitting: Hematology and Oncology

## 2018-02-11 NOTE — Progress Notes (Signed)
[facility] day:  02/12/2018   Chief Complaint: Kelly Munoz is a 58 y.o. female with stage IIB Her2/neu + right breast cancer and a BRCA2 mutation who is seen for 6 month assessment.  HPI:  The patient was last seen in medical oncology clinic on 08/10/2017.  At that time, she was doing well.  She continued to have tremors.  Exam was stable.  Paraneoplastic antibody panel was negative on 08/11/2017.  Bilateral diagnostic mammogram on 08/08/2017 revealed prior right lumpectomy with no findings of malignancy in either breast.  There were no mammographic or sonographic abnormalities to account for left nipple sensitivity.  Bilateral breast MRI on 02/08/2018 revealed no evidence of malignancy within either breast. Postsurgical changes were present within the RIGHT breast.  She was referred to [facility] for tremors.  She was seen on 09/06/2017 and 12/27/2017.  Notes reviewed.  Tremors were worse on gabapentin.  Alternative treatments with propanolol, primidone or clonazepam were discussed.  Patient declined.  She has a follow-up appointment in 6 months.  [facility] labs on 02/01/2018 revealed a hematocrit of 39.7, hemoglobin 13.2, MCV 89, platelets 235,000, WBC 9800 with an ANC of 200.  Creatinine 0.58 and LFTs normal.  Tumor markers were normal:  CA19-9 (20), CA125 (12.9), CA27.29 (16.1), CA15-3 (13.8).  During the interim, patient is doing well today. She does not make note of any acute or concerning symptoms. She feels generally well. Patient denies that she has experienced any B symptoms. She denies any interval infections.  Patient does not verbalize any concerns with regards to her breasts today. Patient performs monthly self breast examinations as recommended. Patient continues on the tamoxifen as prescribed.   Patient continues to have tremors on the LEFT side of her body. She advises that her neurologist advises that it could be delayed effects  from her chemotherapy. Patient states, "he told me that my port was on the LEFT side, therefore the nerves on the LEFT side of my body got higher concentrations of the toxic chemotherapy". Essential tremors were felt to be less likely, while early Parkinson's was a possibility.  Of note, patient was able to appreciate an increase in her tremors with the injection of the gadolinium used during her MRI.   Patient has periods of facial flushing. She states, "I have been told that it has an autoimmune component". Patient notes strong family history of several autoimmune conditions. She complains of issues with her memory since received chemotherapy. 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 Discussed REMEMBER study inclusion, and patient is in agreement.   Patient advises that she maintains an adequate appetite. She is eating well. Weight today is 150 lb 5.7 oz (68.2 kg), which compared to her last visit to the clinic, represents a 2 pound decrease.    Patient denies pain in the clinic today.   Past Medical History:  Diagnosis Date  . Breast cancer ([place])   . Breast cancer, right ([place]) 06/08/2011  . Family history of breast cancer   . Family history of colon cancer   . Family history of prostate cancer   . History of IBS   . History of kidney stones   . Hypertension   . 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[facility])     Past Surgical History:  Procedure Laterality Date  . ABDOMINAL HYSTERECTOMY    . BREAST BIOPSY Right 2013   lumpectomy chem and rad  . COLONOSCOPY WITH PROPOFOL N/A  02/06/2017   Procedure: COLONOSCOPY WITH PROPOFOL;  Surgeon: Lollie Sails, MD;  Location: [facility] ENDOSCOPY;  Service: Endoscopy;  Laterality: N/A;  . CYSTOSCOPY N/A 11/09/2015   Procedure: CYSTOSCOPY;  Surgeon: Honor Loh Ward, MD;  Location: [facility] ORS;  Service: Gynecology;  Laterality: N/A;  . parotid stone      Removal  . REDUCTION MAMMAPLASTY Bilateral 02/2013  . TONSILLECTOMY      Family History   Problem Relation Age of Onset  . Breast cancer Mother 13  . Kidney cancer Father        dx in his mid 42s  . Prostate cancer Maternal Uncle        dx in mid 33s  . Stomach cancer Paternal Aunt        dx in mid 41s  . Diabetes Maternal Grandmother   . Heart disease Maternal Grandmother   . Prostate cancer Maternal Grandfather   . Heart disease Paternal Grandmother   . Heart attack Paternal Grandfather   . Prostate cancer Maternal Uncle        dx in mid 65s  . Prostate cancer Maternal Uncle        dx in mid 62s  . Colon cancer Maternal Uncle        dx in mid 63s  . Colon cancer Cousin        paternal first cousin dx in his mid 67s  . Colon cancer Cousin        paternal first cousin dx in his mid 63s    Social History:  reports that she has never smoked. She has never used smokeless tobacco. She reports that she does not drink alcohol or use drugs.  She is from [place].  She moved to [place] at the end of 03/2015 to be with her family.  Her parents live in [place].  She lives with her husband.  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  She has 2 daughters (81 year-old in graduate school, 57 year-old in [place]).  Her husband had an MI and was hospitalized in [place].  Her husband's name is Al.  The patient is accompanied by her husband today.  Allergies:  Allergies  Allergen Reactions  . Zithromax [Azithromycin] Diarrhea  . Clindamycin/Lincomycin Rash  . Erythromycin Rash    Current Medications: Current Outpatient Medications  Medication Sig Dispense Refill  . Cholecalciferol (VITAMIN D3) 5000 units CAPS Take 5,000 Units by mouth daily.    Javier Docker Oil (MAXIMUM RED KRILL) 300 MG CAPS Take by mouth.    Marland Kitchen LIALDA 1.2 g EC tablet Take 2.4 g by mouth daily with breakfast.     . metoprolol succinate (TOPROL-XL) 25 MG 24 hr tablet Take 25 mg by mouth daily.    . ramipril (ALTACE) 10 MG capsule Take 10 mg by mouth daily.      . tamoxifen (NOLVADEX) 20 MG tablet Take 1 tablet (20 mg total) by mouth every evening. 90 tablet 0  . acetaminophen (TYLENOL) 325 MG tablet Take 650 mg by mouth every 6 (six) hours as needed for moderate pain or headache.    . Carboxymethylcellulose Sodium (LUBRICATING PLUS EYE DROPS OP) Apply 1 drop to eye daily as needed (dry eyes).    Marland Kitchen FISH OIL-KRILL OIL PO Take 1 tablet by mouth daily.    . fluticasone (FLONASE) 50 MCG/ACT nasal spray Place 1-2 sprays into both nostrils daily as needed for allergies  or rhinitis.    . hydrocortisone 2.5 % cream Apply topically.    Marland Kitchen ibuprofen (ADVIL,MOTRIN) 600 MG tablet Take 1 tablet (600 mg total) by mouth every 6 (six) hours as needed. (Patient not taking: Reported on 02/12/2018) 30 tablet 0  . pravastatin (PRAVACHOL) 20 MG tablet Take 20 mg by mouth at bedtime.      No current facility-administered medications for this visit.     Review of Systems:  GENERAL:  Feels "well".  No fevers, sweats.  Weight down 2 pounds. PERFORMANCE STATUS (ECOG):  1 HEENT:  No visual changes, runny nose, sore throat, mouth sores or tenderness. Lungs: No shortness of breath or cough.  No hemoptysis. Cardiac:  No chest pain, palpitations, orthopnea, or PND. GI:  Irritable bowel.  No nausea, vomiting, diarrhea, constipation, melena or hematochezia. GU:  No urgency, frequency, dysuria, or hematuria. S/p TAH/BSO. Musculoskeletal:  No back pain.  No joint pain.  No muscle tenderness. Extremities:  No pain or swelling. Skin:  No rashes or skin changes. Neuro:  Notes "chemo brain".  Left sided tremors.  No headache, numbness or weakness, balance or coordination issues. Endocrine:  No diabetes, thyroid issues, hot flashes or night sweats.  Facial flushing. Psych:  No mood changes, depression or anxiety. Pain:  No focal pain. Review of systems:  All other systems reviewed and found to be negative.   Physical Exam: Blood pressure 134/80, pulse 91, temperature 99.6 F (37.6  C), temperature source Tympanic, resp. rate 18, height 5' 2.5" (1.588 m), weight 150 lb 5.7 oz (68.2 kg), SpO2 100 %. GENERAL:  Well developed, well nourished, woman sitting comfortably in the exam room in no acute distress. MENTAL STATUS:  Alert and oriented to person, place and time. HEAD:  Long brown hair with graying. Normocephalic, atraumatic, face symmetric, no Cushingoid features. EYES:  Brown eyes.  Pupils equal round and reactive to light and accomodation.  No conjunctivitis or scleral icterus. ENT:  Oropharynx clear without lesion.  Tongue normal. Mucous membranes moist.  RESPIRATORY:  Clear to auscultation without rales, wheezes or rhonchi. CARDIOVASCULAR:  Regular rate and rhythm without murmur, rub or gallop. BREAST:  Right breast with post-operative and post radiation changes.  No masses, skin changes or nipple discharge.  Left breast without masses, skin changes or nipple discharge. ABDOMEN:  Soft, non-tender, with active bowel sounds, and no hepatosplenomegaly.  No masses. SKIN:  No rashes, ulcers or lesions. EXTREMITIES: No edema, no skin discoloration or tenderness.  No palpable cords. LYMPH NODES: No palpable cervical, supraclavicular, axillary or inguinal adenopathy  NEUROLOGICAL: Some word finding difficulty. PSYCH:  Appropriate.    [facility] Labs:    02/04/2016:  Hematocrit 39.3, hemoglobin 12.9, MCV 89, platelets 232,000, white count 7400 with an ANC of 4600.  Comprehensive metabolic panel included a sodium of 140, potassium of 4.3, creatinine 0.65, bilirubin 0.3, alkaline phosphatase 59, SGOT 36, SGPT 33. CA 19-9 was 21. CA 27-29 was 19.4.   08/01/2016:  Hematocrit of 41.7, hemoglobin 13.2, MCV 90, platelets 237,000, white count 7600 with an ANC of 4000. Comprehensive metabolic panel included a sodium of 41, potassium 4.3, creatinine 0.67, bilirubin 0.2, alkaline phosphatase 56, AST 49,and  ALT 45. CA19-9 was 29. CA27.29 was 17.2. 09/14/2016:  AST 65, ALT 52, bilirubin 0.3,  alkaline phosphatase 57, hepatitis B core antibody negative, hepatitis B surface antigen negative, hepatitis C antibody negative. 01/25/2017:  Hematocrit 40.8, hemoglobin 13.3, MCV 90, platelets 227,000, white count 6900.  Comprehensive metabolic panel included a sodium  of 140, potassium of 4.2, creatinine 0.62, bilirubin 0.4, alkaline phosphatase 56, SGOT 68, SGPT 70. CA 19-9 was 23. CA 27-29 was 16.3. 02/01/2018:  Hematocrit of 39.7, hemoglobin 13.2, MCV 89, platelets 235,000, WBC 9800 with an ANC of 200.  Creatinine 0.58 and LFTs normal.  Tumor markers were normal:  CA19-9 (20), CA125 (12.9), CA27.29 (16.1), CA15-3 (13.8).   No visits with results within 3 Day(s) from this visit.  Latest known visit with results is:  Hospital Outpatient Visit on 03/22/2017  Component Date Value Ref Range Status  . Creatinine, Ser 03/22/2017 0.60  0.44 - 1.00 mg/dL Final    Assessment:  Kelly Munoz is a 58 y.o. female with stage IIB Her2/neu + right breast cancer s/p right partial mastectomy and sentinel lymph node biopsy on 06/08/2011.  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  There was solid, cribriform and comedo DCIS with necrosis.  One of 5 lymph nodes were positive.  There was a macro-metastasis in one intramammary lymph node.  Tumor was ER positive (88.8%), PR positive (90.93%), and Her2/neu 3+.  Ki-67 was 48%.   Pathologic stage was [id number].   My Risk Genetic testing on 07/13/2015 revealed a BRCA2 mutation ([id number]). 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  She received 6 cycles of TCH chemotherapy beginning 07/11/2011.  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  She completed 1 year of adjuvant Herceptin.  At one point, her EF decreased (no records available).  EF returned to normal per patient report (last echo was in 2014).  She received radiation (completed before the end of 2013).  She began  tamoxifen in 01/2012.    CA27.29 has been followed: 13.2 on 03/04/2015, 21.2 on 08/14/2015, 19.4 on 02/04/2016, 17.2 on 08/01/2016, 16.3 on 01/25/2017, and 16.1 on 01/29/2018.  Bilateral breast MRI on 01/30/2017 revealed no evidence of malignancy.  Bilateral mammogram on 08/03/2016 revealed  No evidence of malignancy.  Bilateral diagnostic mammogram on 08/08/2017 revealed prior right lumpectomy with no findings of malignancy in either breast.  There were no mammographic or sonographic abnormalities to account for left nipple sensitivity.  Bilateral breast MRI on 02/08/2018 revealed no evidence of malignancy within either breast. Postsurgical changes were present within the RIGHT breast.  Bone density on 03/23/2016 revealed osteopenia with a T-score of -1.4 in the right femoral neck.  She underwent total laparoscopic hysterectomy and bilateral oophorectomy on 11/09/2015.  Pathology revealed no evidence of malignancy.  She has a history of mildly increased liver function tests.  She has fatty liver.  She was on a statin drug.  Hepatitis B and C serologies were negative on 09/14/2016.  She denies any new medication or herbal product.    She has tremors of unknown etiology.  Paraneoplastic antibody panel was negative on 08/11/2017.  She has been followed by Dr. Melrose Nakayama, neurologist.  Symptomatically, she continues to have tremors.  She has word finding difficulties.  Exam is unremarkable.  Plan: 1.   Review [facility] labs. 2.   Stage IIB Her2/neu + right breast cancer  Clinically doing well.  She remains on tamoxifen.  Bilateral diagnostic mammogram on 08/08/2017- negative.  Bilateral breast MRI on 02/08/2018- negative.  Discuss plan to continue yearly breast MRI and mammogram alternating every 6 months.  Schedule mammogram on 08/09/2018.  Continue tamoxifen. 3.   BRCA2 mutation  Continue close surveillance. 4.   Tremors  Patient has been seen by neurology.  Paraneoplastic panel was  negative.  Etiology remains unclear. 5.   Osteopenia  Patient on calcium and vitamin D.  Schedule bone density on 03/26/2018. 6.   Memory concerns  Discuss referral to REMEMBER study. 7.   [facility] slip for labs prior to next visit (CBC with diff, CMP, CA27.29). 8.   RTC in 6 months for MD assessment, review of [facility] labs, mammogram, and bone density study.     Honor Loh, NP  02/12/2018, 2:43 PM   I saw and evaluated the patient, participating in the key portions of the service and reviewing pertinent diagnostic studies and records.  I reviewed the nurse practitioner's note and agree with the findings and the plan.  The assessment and plan were discussed with the patient.  Multiple questions were asked by the patient and answered.   Nolon Stalls, MD 02/12/2018,2:43 PM

## 2018-02-12 ENCOUNTER — Encounter: Payer: Self-pay | Admitting: Hematology and Oncology

## 2018-02-12 ENCOUNTER — Inpatient Hospital Stay: Payer: BLUE CROSS/BLUE SHIELD | Attending: Hematology and Oncology | Admitting: Hematology and Oncology

## 2018-02-12 VITALS — BP 134/80 | HR 91 | Temp 99.6°F | Resp 18 | Ht 62.5 in | Wt 150.4 lb

## 2018-02-12 DIAGNOSIS — E232 Diabetes insipidus: Secondary | ICD-10-CM

## 2018-02-12 DIAGNOSIS — R232 Flushing: Secondary | ICD-10-CM | POA: Insufficient documentation

## 2018-02-12 DIAGNOSIS — Z800 Family history of malignant neoplasm of digestive organs: Secondary | ICD-10-CM | POA: Diagnosis not present

## 2018-02-12 DIAGNOSIS — Z1501 Genetic susceptibility to malignant neoplasm of breast: Secondary | ICD-10-CM

## 2018-02-12 DIAGNOSIS — R251 Tremor, unspecified: Secondary | ICD-10-CM

## 2018-02-12 DIAGNOSIS — Z79899 Other long term (current) drug therapy: Secondary | ICD-10-CM | POA: Diagnosis not present

## 2018-02-12 DIAGNOSIS — Z79810 Long term (current) use of selective estrogen receptor modulators (SERMs): Secondary | ICD-10-CM | POA: Diagnosis not present

## 2018-02-12 DIAGNOSIS — K760 Fatty (change of) liver, not elsewhere classified: Secondary | ICD-10-CM | POA: Diagnosis not present

## 2018-02-12 DIAGNOSIS — M810 Age-related osteoporosis without current pathological fracture: Secondary | ICD-10-CM

## 2018-02-12 DIAGNOSIS — Z8042 Family history of malignant neoplasm of prostate: Secondary | ICD-10-CM | POA: Diagnosis not present

## 2018-02-12 DIAGNOSIS — C50911 Malignant neoplasm of unspecified site of right female breast: Secondary | ICD-10-CM | POA: Diagnosis not present

## 2018-02-12 DIAGNOSIS — M8580 Other specified disorders of bone density and structure, unspecified site: Secondary | ICD-10-CM | POA: Insufficient documentation

## 2018-02-12 DIAGNOSIS — Z170 Estrogen receptor positive status [ER+]: Secondary | ICD-10-CM | POA: Diagnosis not present

## 2018-02-12 DIAGNOSIS — Z803 Family history of malignant neoplasm of breast: Secondary | ICD-10-CM | POA: Diagnosis not present

## 2018-02-12 DIAGNOSIS — M85851 Other specified disorders of bone density and structure, right thigh: Secondary | ICD-10-CM

## 2018-02-12 DIAGNOSIS — I10 Essential (primary) hypertension: Secondary | ICD-10-CM | POA: Diagnosis not present

## 2018-02-12 NOTE — Progress Notes (Signed)
No new changes noted today 

## 2018-03-27 ENCOUNTER — Other Ambulatory Visit: Payer: Self-pay

## 2018-03-27 ENCOUNTER — Ambulatory Visit
Admission: RE | Admit: 2018-03-27 | Discharge: 2018-03-27 | Disposition: A | Discharge status: Home / Self Care | Payer: BLUE CROSS/BLUE SHIELD | Source: Ambulatory Visit | Attending: Urgent Care | Admitting: Urgent Care

## 2018-03-27 DIAGNOSIS — M810 Age-related osteoporosis without current pathological fracture: Secondary | ICD-10-CM | POA: Diagnosis present

## 2018-03-27 DIAGNOSIS — Z170 Estrogen receptor positive status [ER+]: Secondary | ICD-10-CM | POA: Insufficient documentation

## 2018-03-27 DIAGNOSIS — C50911 Malignant neoplasm of unspecified site of right female breast: Secondary | ICD-10-CM | POA: Insufficient documentation

## 2018-06-08 ENCOUNTER — Other Ambulatory Visit: Payer: Self-pay | Admitting: Hematology and Oncology

## 2018-08-10 ENCOUNTER — Ambulatory Visit
Admission: RE | Admit: 2018-08-10 | Discharge: 2018-08-10 | Disposition: A | Discharge status: Home / Self Care | Payer: BLUE CROSS/BLUE SHIELD | Source: Ambulatory Visit | Attending: Urgent Care | Admitting: Urgent Care

## 2018-08-10 ENCOUNTER — Other Ambulatory Visit: Payer: Self-pay

## 2018-08-10 DIAGNOSIS — Z853 Personal history of malignant neoplasm of breast: Secondary | ICD-10-CM | POA: Diagnosis not present

## 2018-08-10 DIAGNOSIS — Z1231 Encounter for screening mammogram for malignant neoplasm of breast: Secondary | ICD-10-CM | POA: Insufficient documentation

## 2018-08-10 DIAGNOSIS — Z1501 Genetic susceptibility to malignant neoplasm of breast: Secondary | ICD-10-CM | POA: Insufficient documentation

## 2018-08-10 DIAGNOSIS — C50911 Malignant neoplasm of unspecified site of right female breast: Secondary | ICD-10-CM | POA: Diagnosis not present

## 2018-08-10 DIAGNOSIS — Z170 Estrogen receptor positive status [ER+]: Secondary | ICD-10-CM | POA: Insufficient documentation

## 2018-08-10 NOTE — Progress Notes (Signed)
[facility]  [address], [address] [place], [place] [address] Phone: [phone]  Fax: [phone]   Clinic Day:  08/13/2018  Referring physician: Glendon Axe, MD  Chief Complaint: Kelly Munoz is a 58 y.o. female with stage IIB Her2/neu + right breast cancer and a BRCA2 mutation who is seen for 6 month assessment.  HPI: The patient was last seen in the medical oncology clinic on 02/12/2018. At that time, she continued to have tremors. She had word finding difficulties. Exam was unremarkable. CA27.29 was normal on 01/29/2018.  Bone density on 03/27/2018 revealed osteopenia with a T-score of -1.9 at the femur neck right.   She was seen in neurology on 05/09/2018 and 07/11/2018 by Dr. Carlena Sax for Parkinsonism. She continued on Sinemet. She was referred to physical therapy.   Bilateral screening mammogram on 08/10/2018 revealed no evidence of malignancy.   [facility] labs on 08/02/2018: hematocrit of 40.8, hemoglobin 13.1, MCV 90, platelets 228,000, WBC 8000 with an ANC of 4200.  Creatinine 0.65 and LFTs normal.  CA27.29 was normal (16.6).  During the interim, she has done well. She notes mild hair loss. She notes significant improvement in her tremors with Sinemet. 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   She would prefer to continue on tamoxifen as opposed to switching to an aromatase inhibitor due to the potential side effect of arthralgias.   She notes continued memory issues and will undergo cognitive testing in 11/2018.   She continues on Vitamin D supplement, but notes she does not take Calcium supplements due to history of GI issues with them.    Past Medical History:  Diagnosis Date   Breast cancer ([place])    Breast cancer, right ([place]) 06/08/2011   Family history of breast cancer    Family history of colon cancer    Family history of prostate cancer    History of IBS    History of kidney stones     Hypertension    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[facility])    Personal history of chemotherapy 2013   RIGHT lumpectomy   Personal history of radiation therapy 2013   RIGHT lumpectomy    Past Surgical History:  Procedure Laterality Date   ABDOMINAL HYSTERECTOMY     BREAST BIOPSY Right 2013   lumpectomy chem and rad   BREAST LUMPECTOMY Right 2013   COLONOSCOPY WITH PROPOFOL N/A 02/06/2017   Procedure: COLONOSCOPY WITH PROPOFOL;  Surgeon: Lollie Sails, MD;  Location: [facility] ENDOSCOPY;  Service: Endoscopy;  Laterality: N/A;   CYSTOSCOPY N/A 11/09/2015   Procedure: CYSTOSCOPY;  Surgeon: Honor Loh Ward, MD;  Location: [facility] ORS;  Service: Gynecology;  Laterality: N/A;   parotid stone      Removal   REDUCTION MAMMAPLASTY Bilateral 02/2013   TONSILLECTOMY      Family History  Problem Relation Age of Onset   Breast cancer Mother 37   Kidney cancer Father        dx in his mid 23s   Prostate cancer Maternal Uncle        dx in mid 26s   Stomach cancer Paternal Aunt        dx in mid 58s   Diabetes Maternal Grandmother    Heart disease Maternal Grandmother    Prostate cancer Maternal Grandfather    Heart disease Paternal Grandmother    Heart attack Paternal Grandfather    Prostate cancer Maternal Uncle  dx in mid 73s   Prostate cancer Maternal Uncle        dx in mid 60s   Colon cancer Maternal Uncle        dx in mid 15s   Colon cancer Cousin        paternal first cousin dx in his mid 65s   Colon cancer Cousin        paternal first cousin dx in his mid 40s    Social History:  reports that she has never smoked. She has never used smokeless tobacco. She reports that she does not drink alcohol or use drugs. She is from [place].  She moved to [place] at the end of 03/2015 to be with her family.  Her parents live in [place].  She lives with her husband.  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  She has 2 daughters (62 year-old in graduate school, 57 year-old in [place]). Her husband had an MI and was hospitalized in [place]. Her husband's name is Al. The patient is alone today.  Allergies:  Allergies  Allergen Reactions   Zithromax [Azithromycin] Diarrhea   Clindamycin/Lincomycin Rash   Erythromycin Rash    Current Medications: Current Outpatient Medications  Medication Sig Dispense Refill   carbidopa-levodopa (SINEMET IR) 25-250 MG tablet      Cholecalciferol (VITAMIN D3) 5000 units CAPS Take 5,000 Units by mouth daily.     Krill Oil (MAXIMUM RED KRILL) 300 MG CAPS Take by mouth.     LIALDA 1.2 g EC tablet Take 2.4 g by mouth daily with breakfast.      metoprolol succinate (TOPROL-XL) 25 MG 24 hr tablet Take 25 mg by mouth daily.     ramipril (ALTACE) 10 MG capsule Take 10 mg by mouth daily.      tamoxifen (NOLVADEX) 20 MG tablet TAKE 1 TABLET BY MOUTH  EVERY DAY 90 tablet 0   acetaminophen (TYLENOL) 325 MG tablet Take 650 mg by mouth every 6 (six) hours as needed for moderate pain or headache.     Carboxymethylcellulose Sodium (LUBRICATING PLUS EYE DROPS OP) Apply 1 drop to eye daily as needed (dry eyes).     FISH OIL-KRILL OIL PO Take 1 tablet by mouth daily.     fluticasone (FLONASE) 50 MCG/ACT nasal spray Place 1-2 sprays into both nostrils daily as needed for allergies or rhinitis.     ibuprofen (ADVIL,MOTRIN) 600 MG tablet Take 1 tablet (600 mg total) by mouth every 6 (six) hours as needed. (Patient not taking: Reported on 02/12/2018) 30 tablet 0   pravastatin (PRAVACHOL) 20 MG tablet Take 20 mg by mouth at bedtime.      No current facility-administered medications for this visit.     Review of Systems  Constitutional: Negative.  Negative for chills, diaphoresis, fever, malaise/fatigue and weight loss (up 1lb).       Doing "well".   HENT: Negative.  Negative for congestion, ear pain, hearing loss, nosebleeds, sinus pain and sore throat.         Hair loss.  Eyes: Negative.  Negative for blurred vision, double vision and pain.  Respiratory: Negative.  Negative for cough, shortness of breath and wheezing.   Cardiovascular: Negative.  Negative for chest pain, palpitations, claudication, leg swelling and PND.  Gastrointestinal: Negative.  Negative for abdominal pain, blood in stool, constipation, diarrhea, melena, nausea and vomiting.       Irritable bowel syndrome.  Genitourinary: Negative.  Negative for dysuria, frequency, hematuria and urgency.       S/p TAH/BSO.  Musculoskeletal: Negative.  Negative for back pain, joint pain and myalgias.  Skin: Negative.  Negative for rash.  Neurological: Positive for tremors (left-sided, improved). Negative for dizziness, tingling, sensory change, weakness and headaches.       Early stage Parkinson's disease.  Endo/Heme/Allergies: Negative.  Does not bruise/bleed easily.  Psychiatric/Behavioral: Positive for memory loss. Negative for depression and substance abuse. The patient is not nervous/anxious and does not have insomnia.   All other systems reviewed and are negative.  Performance status (ECOG): 1  Vitals Blood pressure 139/89, pulse 100, temperature 98.5 F (36.9 C), temperature source Tympanic, resp. rate 18, weight 151 lb 9.1 oz (68.8 kg), SpO2 100 %.   Physical Exam  Constitutional: She is oriented to person, place, and time. She appears well-developed and well-nourished. No distress.  HENT:  Head: Normocephalic and atraumatic.  Mouth/Throat: Oropharynx is clear and moist. No oropharyngeal exudate.  Long brown hair with graying. Mask.  Eyes: Pupils are equal, round, and reactive to light. Conjunctivae and EOM are normal. No scleral icterus.  Blue eyes.  Contacts.  Neck: Normal range of motion. Neck supple. No JVD present.  Cardiovascular: Normal rate, regular rhythm and normal heart sounds.  No murmur heard. Pulmonary/Chest: Effort normal and breath sounds normal. No  respiratory distress. She has no wheezes. She has no rales. Right breast exhibits no inverted nipple, no mass, no nipple discharge, no skin change (post-operative and post radiation scarring) and no tenderness. Left breast exhibits no inverted nipple, no mass, no nipple discharge, no skin change (scar tissue surperiorly and medially) and no tenderness.  Abdominal: Soft. Bowel sounds are normal. She exhibits no distension. There is no abdominal tenderness. There is no rebound and no guarding.  Musculoskeletal: Normal range of motion.        General: No edema.  Lymphadenopathy:    She has no cervical adenopathy.    She has no axillary adenopathy.       Right: No supraclavicular adenopathy present.       Left: No supraclavicular adenopathy present.  Neurological: She is alert and oriented to person, place, and time.  Skin: Skin is warm and dry. She is not diaphoretic. No erythema.  Psychiatric: She has a normal mood and affect. Her behavior is normal. Judgment and thought content normal.  Nursing note and vitals reviewed.  [facility] Labs:    02/04/2016:  Hematocrit 39.3, hemoglobin 12.9, MCV 89, platelets 232,000, white count 7400 with an ANC of 4600.  Comprehensive metabolic panel included a sodium of 140, potassium of 4.3, creatinine 0.65, bilirubin 0.3, alkaline phosphatase 59, SGOT 36, SGPT 33. CA 19-9 was 21. CA 27-29 was 19.4.   08/01/2016:  Hematocrit of 41.7, hemoglobin 13.2, MCV 90, platelets 237,000, white count 7600 with an ANC of 4000. Comprehensive metabolic panel included a sodium of 41, potassium 4.3, creatinine 0.67, bilirubin 0.2, alkaline phosphatase 56, AST 49,and  ALT 45. CA19-9 was 29. CA27.29 was 17.2. 09/14/2016:  AST 65, ALT 52, bilirubin 0.3, alkaline phosphatase 57, hepatitis B core antibody negative, hepatitis B surface antigen negative, hepatitis C antibody negative. 01/25/2017:  Hematocrit 40.8, hemoglobin 13.3, MCV 90, platelets 227,000, white count 6900.  Comprehensive  metabolic panel included a sodium of 140, potassium of 4.2, creatinine 0.62, bilirubin 0.4, alkaline phosphatase 56, SGOT 68, SGPT 70. CA 19-9 was 23. CA 27-29 was 16.3. 02/01/2018:  Hematocrit of 39.7, hemoglobin 13.2,  MCV 89, platelets 235,000, WBC 9800 with an ANC of 200.  Creatinine 0.58 and LFTs normal.  Tumor markers were normal:  CA19-9 (20), CA125 (12.9), CA27.29 (16.1), CA15-3 (13.8). 08/02/2018: Hematocrit of 40.8, hemoglobin 13.1, MCV 90, platelets 228,000, WBC 8000 with an ANC of 4200.  Creatinine 0.65 and LFTs normal.  Tumor markers were normal: CA27.29 (16.6).   No visits with results within 3 Day(s) from this visit.  Latest known visit with results is:  Hospital Outpatient Visit on 03/22/2017  Component Date Value Ref Range Status   Creatinine, Ser 03/22/2017 0.60  0.44 - 1.00 mg/dL Final    Assessment:  Kelly Munoz is a 58 y.o. female with stage IIB Her2/neu + right breast cancer s/p right partial mastectomy and sentinel lymph node biopsy on 06/08/2011.  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  There was solid, cribriform and comedo DCIS with necrosis.  One of 5 lymph nodes were positive.  There was a macro-metastasis in one intramammary lymph node.  Tumor was ER positive (88.8%), PR positive (90.93%), and Her2/neu 3+.  Ki-67 was 48%.   Pathologic stage was [id number].   My Risk Genetic testing on 07/13/2015 revealed a BRCA2 mutation ([id number]). 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  She received 6 cycles of TCH chemotherapy beginning 07/11/2011.  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  She completed 1 year of adjuvant Herceptin.  At one point, her EF decreased (no records available).  EF returned to normal per patient report (last echo was in 2014).  She received radiation (completed before the end of 2013).  She began tamoxifen in 01/2012.    CA27.29 has been followed:  13.2 on 03/04/2015, 21.2 on 08/14/2015, 19.4 on 02/04/2016, 17.2 on 08/01/2016, 16.3 on 01/25/2017, 16.1 on 01/29/2018, 16.6 on 08/02/2018.  Bilateral breast MRI on 01/30/2017 revealed no evidence of malignancy.  Bilateral mammogram on 08/03/2016 revealed  No evidence of malignancy.  Bilateral diagnostic mammogram on 08/08/2017 revealed prior right lumpectomy with no findings of malignancy in either breast.  There were no mammographic or sonographic abnormalities to account for left nipple sensitivity.  Bilateral breast MRI on 02/08/2018 revealed no evidence of malignancy within either breast. Postsurgical changes were present within the RIGHT breast.  Bilateral screening mammogram on 08/10/2018 revealed no evidence of malignancy.   Bone density on 03/23/2016 revealed osteopenia with a T-score of -1.4 in the right femoral neck.  Bone density on 03/27/2018 revealed osteopenia with a T-score of -1.9 at the femur neck right.   She underwent total laparoscopic hysterectomy and bilateral oophorectomy on 11/09/2015.  Pathology revealed no evidence of malignancy.  She has a history of mildly increased liver function tests.  She has fatty liver.  She was on a statin drug.  Hepatitis B and C serologies were negative on 09/14/2016.  She denies any new medication or herbal product.    She was diagnosed with early stage Parkinson's disease.  Tremors have improved on Sinemet.  Paraneoplastic antibody panel was negative on 08/11/2017.  She has been followed by Dr. Melrose Nakayama, neurologist.  Symptomatically, she is doing well.  She denies any breast concerns.  Exam is stable.  Plan: 1.   Review [facility] labs from 08/02/2018. 2.   Stage IIB Her2/neu + right breast cancer             Clinically, she continues to do well.    She is tolerating tamoxifen well.  She does not wish to switch to an aromatase inhibitor.    Bilateral mammogram on 08/10/2018 was negative             Continue alternating mammogram and  breast MRI.  Schedule bilateral breast MRI on 02/11/2019. 3.   BRCA2 mutation             Continue close surveillance. 4.   Early stage Parkinson's disease             Patient is followed by neurology.             Paraneoplastic panel was negative.  Recent diagnosis of early stage Parkinson's.    Tremors have improved significantly with Sinemet.   5.   Osteopenia             Bone density on 03/26/2018 revealed osteopenia with a T score -1.9   Patient on vitamin D alone.             Patient notes GI irritation with calcium.    Encourage other forms of calcium 7.   [facility] slip for labs prior to next visit (CBC with diff, CMP, CA27.29). 8.   RTC after breast MRI for MD assessment, review of [facility] labs and review of MRI.    I discussed the assessment and treatment plan with the patient.  The patient was provided an opportunity to ask questions and all were answered.  The patient agreed with the plan and demonstrated an understanding of the instructions.  The patient was advised to call back if the symptoms worsen or if the condition fails to improve as anticipated.  I provided 15 minutes of face-to-face time during this this encounter and > 50% was spent counseling as documented under my assessment and plan.    Lequita Asal, MD, PhD    08/13/2018, 1:48 PM  I, Molly Dorshimer, am acting as Education administrator for [employer]. Mike Gip, MD, PhD.  I, Jaquila Santelli C. Mike Gip, MD, have reviewed the above documentation for accuracy and completeness, and I agree with the above.

## 2018-08-13 ENCOUNTER — Inpatient Hospital Stay: Payer: BLUE CROSS/BLUE SHIELD | Attending: Hematology and Oncology | Admitting: Hematology and Oncology

## 2018-08-13 ENCOUNTER — Encounter: Payer: Self-pay | Admitting: Hematology and Oncology

## 2018-08-13 ENCOUNTER — Other Ambulatory Visit: Payer: Self-pay

## 2018-08-13 VITALS — BP 139/89 | HR 100 | Temp 98.5°F | Resp 18 | Wt 151.6 lb

## 2018-08-13 DIAGNOSIS — C50911 Malignant neoplasm of unspecified site of right female breast: Secondary | ICD-10-CM | POA: Insufficient documentation

## 2018-08-13 DIAGNOSIS — K760 Fatty (change of) liver, not elsewhere classified: Secondary | ICD-10-CM | POA: Insufficient documentation

## 2018-08-13 DIAGNOSIS — Z170 Estrogen receptor positive status [ER+]: Secondary | ICD-10-CM | POA: Insufficient documentation

## 2018-08-13 DIAGNOSIS — Z79899 Other long term (current) drug therapy: Secondary | ICD-10-CM | POA: Diagnosis not present

## 2018-08-13 DIAGNOSIS — Z8249 Family history of ischemic heart disease and other diseases of the circulatory system: Secondary | ICD-10-CM | POA: Diagnosis not present

## 2018-08-13 DIAGNOSIS — Z9011 Acquired absence of right breast and nipple: Secondary | ICD-10-CM | POA: Insufficient documentation

## 2018-08-13 DIAGNOSIS — Z923 Personal history of irradiation: Secondary | ICD-10-CM | POA: Insufficient documentation

## 2018-08-13 DIAGNOSIS — M85851 Other specified disorders of bone density and structure, right thigh: Secondary | ICD-10-CM | POA: Diagnosis not present

## 2018-08-13 DIAGNOSIS — Z803 Family history of malignant neoplasm of breast: Secondary | ICD-10-CM | POA: Diagnosis not present

## 2018-08-13 DIAGNOSIS — R7989 Other specified abnormal findings of blood chemistry: Secondary | ICD-10-CM | POA: Diagnosis not present

## 2018-08-13 DIAGNOSIS — M8580 Other specified disorders of bone density and structure, unspecified site: Secondary | ICD-10-CM | POA: Diagnosis not present

## 2018-08-13 DIAGNOSIS — Z1509 Genetic susceptibility to other malignant neoplasm: Secondary | ICD-10-CM

## 2018-08-13 DIAGNOSIS — G20 Parkinson's disease: Secondary | ICD-10-CM | POA: Insufficient documentation

## 2018-08-13 DIAGNOSIS — Z791 Long term (current) use of non-steroidal anti-inflammatories (NSAID): Secondary | ICD-10-CM | POA: Diagnosis not present

## 2018-08-13 DIAGNOSIS — Z79810 Long term (current) use of selective estrogen receptor modulators (SERMs): Secondary | ICD-10-CM | POA: Diagnosis not present

## 2018-08-13 DIAGNOSIS — Z1501 Genetic susceptibility to malignant neoplasm of breast: Secondary | ICD-10-CM | POA: Diagnosis not present

## 2018-08-13 NOTE — Progress Notes (Signed)
No new changes noted today 

## 2018-08-15 ENCOUNTER — Encounter: Payer: Self-pay | Admitting: Hematology and Oncology

## 2018-09-01 ENCOUNTER — Other Ambulatory Visit: Payer: Self-pay | Admitting: Hematology and Oncology

## 2018-09-18 DIAGNOSIS — G20 Parkinson's disease: Secondary | ICD-10-CM | POA: Insufficient documentation

## 2018-09-18 DIAGNOSIS — Z8719 Personal history of other diseases of the digestive system: Secondary | ICD-10-CM | POA: Insufficient documentation

## 2018-10-15 ENCOUNTER — Other Ambulatory Visit: Payer: Self-pay | Admitting: Hematology and Oncology

## 2018-11-06 ENCOUNTER — Encounter: Payer: Self-pay | Admitting: Gastroenterology

## 2018-11-14 ENCOUNTER — Other Ambulatory Visit: Payer: Self-pay

## 2018-11-14 ENCOUNTER — Ambulatory Visit (INDEPENDENT_AMBULATORY_CARE_PROVIDER_SITE_OTHER): Payer: BLUE CROSS/BLUE SHIELD | Admitting: Gastroenterology

## 2018-11-14 ENCOUNTER — Encounter: Payer: Self-pay | Admitting: Gastroenterology

## 2018-11-14 VITALS — BP 142/84 | Temp 98.8°F | Ht 62.5 in | Wt 151.4 lb

## 2018-11-14 DIAGNOSIS — K529 Noninfective gastroenteritis and colitis, unspecified: Secondary | ICD-10-CM

## 2018-11-14 MED ORDER — MESALAMINE 1.2 G PO TBEC: 2.4000 g | DELAYED_RELEASE_TABLET | Freq: Every day | ORAL | 3 refills | Status: DC

## 2018-11-14 NOTE — Progress Notes (Signed)
Gastroenterology Consultation  Referring Provider:     Kirk Ruths, MD Primary Care Physician:  Karen Kitchens, NP Primary Gastroenterologist:  Dr. Allen Norris     Reason for Consultation:     Crohn's disease        HPI:   Kelly Munoz is a 58 y.o. y/o female referred for consultation & management of chronic disease by Dr. Pearline Cables, Doris Cheadle, NP.  This patient comes to see me after being followed by Dr. Gustavo Lah for Crohn's disease.  The patient had a colonoscopy with normal biopsies throughout the colon back in January 2019.  The biopsy showed focal mild active ileitis.  The patient had reported that she had a precancerous polyp at the age of 68 and then diagnosed about the age of 55 with Crohn's disease.  The patient was originally on his recall and then switched over to [place].  The patient denies ever being on immunologic's or Biologics.  She does report having steroids once. The patient does have a history of fatty liver with an ultrasound showing diffuse fatty liver in February 2019.  The patient's most recent lab work did not show any abnormalities of her liver function test.  The patient also had a fibrosis scan that was consistent with F0/F1.  The patient was found to have a BRCA 2 mutation. The patient reports that she was also tested for the APC gene and was positive for that.  She denies that her family members have had any of these genes turned out positive.  The patient has had no problems with her Crohn's disease recently.  She also reports that she needs refills of her medications.   Past Medical History:  Diagnosis Date   Breast cancer ([place])    Breast cancer, right ([place]) 06/08/2011   Family history of breast cancer    Family history of colon cancer    Family history of prostate cancer    History of IBS    History of kidney stones    Hypertension    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[facility])    Personal history of chemotherapy 2013   RIGHT lumpectomy    Personal history of radiation therapy 2013   RIGHT lumpectomy    Past Surgical History:  Procedure Laterality Date   ABDOMINAL HYSTERECTOMY     BREAST BIOPSY Right 2013   lumpectomy chem and rad   BREAST LUMPECTOMY Right 2013   COLONOSCOPY WITH PROPOFOL N/A 02/06/2017   Procedure: COLONOSCOPY WITH PROPOFOL;  Surgeon: Lollie Sails, MD;  Location: [facility] ENDOSCOPY;  Service: Endoscopy;  Laterality: N/A;   CYSTOSCOPY N/A 11/09/2015   Procedure: CYSTOSCOPY;  Surgeon: Honor Loh Ward, MD;  Location: [facility] ORS;  Service: Gynecology;  Laterality: N/A;   parotid stone      Removal   REDUCTION MAMMAPLASTY Bilateral 02/2013   TONSILLECTOMY      Prior to Admission medications   Medication Sig Start Date End Date Taking? Authorizing Provider  acetaminophen (TYLENOL) 325 MG tablet Take 650 mg by mouth every 6 (six) hours as needed for moderate pain or headache.    [provider]  carbidopa-levodopa (SINEMET IR) 25-250 MG tablet  08/06/18   [provider]  Carboxymethylcellulose Sodium (LUBRICATING PLUS EYE DROPS OP) Apply 1 drop to eye daily as needed (dry eyes).    [provider]  Cholecalciferol (VITAMIN D3) 5000 units CAPS Take 5,000 Units by mouth daily.    [provider]  FISH OIL-KRILL OIL PO  Take 1 tablet by mouth daily.    [provider]  fluticasone (FLONASE) 50 MCG/ACT nasal spray Place 1-2 sprays into both nostrils daily as needed for allergies or rhinitis.    [provider]  ibuprofen (ADVIL,MOTRIN) 600 MG tablet Take 1 tablet (600 mg total) by mouth every 6 (six) hours as needed. Patient not taking: Reported on 02/12/2018 11/09/15   Ward, Honor Loh, MD  Javier Docker Oil (MAXIMUM RED KRILL) 300 MG CAPS Take by mouth.    [provider]  LIALDA 1.2 g EC tablet Take 2.4 g by mouth daily with breakfast.  05/27/15   [provider]  metoprolol succinate (TOPROL-XL) 25 MG 24 hr tablet Take 25 mg by mouth daily.     [provider]  pravastatin (PRAVACHOL) 20 MG tablet Take 20 mg by mouth at bedtime.  08/07/15   [provider]  ramipril (ALTACE) 10 MG capsule Take 10 mg by mouth daily.  06/15/15   [provider]  tamoxifen (NOLVADEX) 20 MG tablet TAKE 1 TABLET BY MOUTH  DAILY 10/15/18   Lequita Asal, MD    Family History  Problem Relation Age of Onset   Breast cancer Mother 21   Kidney cancer Father        dx in his mid 110s   Prostate cancer Maternal Uncle        dx in mid 53s   Stomach cancer Paternal Aunt        dx in mid 67s   Diabetes Maternal Grandmother    Heart disease Maternal Grandmother    Prostate cancer Maternal Grandfather    Heart disease Paternal Grandmother    Heart attack Paternal Grandfather    Prostate cancer Maternal Uncle        dx in mid 61s   Prostate cancer Maternal Uncle        dx in mid 47s   Colon cancer Maternal Uncle        dx in mid 59s   Colon cancer Cousin        paternal first cousin dx in his mid 13s   Colon cancer Cousin        paternal first cousin dx in his mid 42s     Social History   Tobacco Use   Smoking status: Never Smoker   Smokeless tobacco: Never Used  Substance Use Topics   Alcohol use: No   Drug use: No    Allergies as of 11/14/2018 - Review Complete 08/13/2018  Allergen Reaction Noted   Zithromax [azithromycin] Diarrhea 08/11/2015   Clindamycin/lincomycin Rash 08/11/2015   Erythromycin Rash 08/11/2015    Review of Systems:    All systems reviewed and negative except where noted in HPI.   Physical Exam:  There were no vitals taken for this visit. No LMP recorded. Patient has had a hysterectomy. General:   Alert,  Well-developed, well-nourished, pleasant and cooperative in NAD Head:  Normocephalic and atraumatic. Eyes:  Sclera clear, no icterus.   Conjunctiva pink. Ears:  Normal auditory acuity. Neck:  Supple; no masses or thyromegaly. Lungs:  Respirations even and  unlabored.  Clear throughout to auscultation.   No wheezes, crackles, or rhonchi. No acute distress. Heart:  Regular rate and rhythm; no murmurs, clicks, rubs, or gallops. Abdomen:  Normal bowel sounds.  No bruits.  Soft, non-tender and non-distended without masses, hepatosplenomegaly or hernias noted.  No guarding or rebound tenderness.  Negative Carnett sign.   Rectal:  Deferred.  Msk:  Symmetrical without gross deformities.  Good, equal movement & strength bilaterally. Pulses:  Normal pulses noted. Extremities:  No clubbing or edema.  No cyanosis. Neurologic:  Alert and oriented x3;  grossly normal neurologically. Skin:  Intact without significant lesions or rashes.  No jaundice. Lymph Nodes:  No significant cervical adenopathy. Psych:  Alert and cooperative. Normal mood and affect.  Imaging Studies: No results found.  Assessment and Plan:   KIANDRA SANGUINETTI is a 58 y.o. y/o female who comes in today for a change in her care provider since her gastroenterologist has retired.  The patient is stable and has a history of adenomatous polyps.  The patient has been told that her next colonoscopy should be in 3 years from her last one.  There is no report of any rectal bleeding or diarrhea.  The patient appears to be very stable from her Crohn's point of view.  The patient also had a recent bout of a anal fissure which she was treated with a compounding agent which included lidocaine and nitroglycerin.  The patient has been told we will refill any of her medications she needs an follow-up at the time of her next colonoscopy.  Is also been told to follow-up sooner if she has any GI problems.  The patient has been explained the plan and agrees with it.    Lucilla Lame, MD. Marval Regal    Note: This dictation was prepared with Dragon dictation along with smaller phrase technology. Any transcriptional errors that result from this process are unintentional.

## 2018-12-11 ENCOUNTER — Ambulatory Visit: Payer: BLUE CROSS/BLUE SHIELD | Admitting: Gastroenterology

## 2019-01-02 ENCOUNTER — Ambulatory Visit: Payer: BLUE CROSS/BLUE SHIELD | Attending: Internal Medicine

## 2019-01-02 DIAGNOSIS — Z20822 Contact with and (suspected) exposure to covid-19: Secondary | ICD-10-CM

## 2019-01-04 LAB — NOVEL CORONAVIRUS, NAA: SARS-CoV-2, NAA: NOT DETECTED

## 2019-02-05 ENCOUNTER — Encounter: Payer: Self-pay | Admitting: Hematology and Oncology

## 2019-02-11 ENCOUNTER — Ambulatory Visit: Payer: BLUE CROSS/BLUE SHIELD

## 2019-02-11 NOTE — Progress Notes (Deleted)
x

## 2019-02-13 ENCOUNTER — Inpatient Hospital Stay: Payer: BLUE CROSS/BLUE SHIELD | Admitting: Hematology and Oncology

## 2019-02-13 NOTE — Progress Notes (Signed)
[facility]  [address], [address] [place], [place] [address] Phone: [phone]  Fax: [phone]   Clinic Day:  02/15/2019  Referring physician: Kirk Ruths, MD  Chief Complaint: Kelly Munoz is a 59 y.o. female with stage IIB Her2/neu + right breast cancer and a BRCA2 mutation who is seen for 6 month assessment.  HPI: The patient was last seen in the medical oncology clinic on 08/13/2018. At that time, she was doing well.  She denied any breast concerns.  Exam was stable.  CA27.29 was normal.  Breast MRI on 02/14/2019 revealed postsurgical changes in the right breast. There was no MRI evidence of malignancy seen in either breast.  She was seen for routine follow up of angina and cardiomyopathy with Dr. Clayborn Bigness. She was doing well at that time.  She has a follow up in 1 year.   [facility] labs on 02/04/2019 revealed a hematocrit 40.3, hemoglobin 13.4, MCV 90.0, platelets 215,000, WBC 8000, ANC 3800. CA27.29 was 16.8.  During the interim, she has felt "well". She denies any physical complaints. She still has hair shedding and believes it might be due to tamoxifen.  She had cognitive testing done but hasn't been back to psychiatry for results.  She only has tremors in her left foot.  Carbidopa-levodopa is helping.  She was seen Dr. Durwin Reges.  It was recommended to switch from every 2 year to 3 year colonoscopies.  She is waiting for her COVID vaccine.    Past Medical History:  Diagnosis Date  . Breast cancer ([place])   . Breast cancer, right ([place]) 06/08/2011  . Family history of breast cancer   . Family history of colon cancer   . Family history of prostate cancer   . History of IBS   . History of kidney stones   . Hypertension   . 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([place])   . Personal history of chemotherapy 2013   RIGHT lumpectomy  . Personal history of radiation therapy 2013   RIGHT lumpectomy    Past Surgical History:  Procedure  Laterality Date  . ABDOMINAL HYSTERECTOMY    . BREAST BIOPSY Right 2013   lumpectomy chem and rad  . BREAST LUMPECTOMY Right 2013  . COLONOSCOPY WITH PROPOFOL N/A 02/06/2017   Procedure: COLONOSCOPY WITH PROPOFOL;  Surgeon: Lollie Sails, MD;  Location: [facility] ENDOSCOPY;  Service: Endoscopy;  Laterality: N/A;  . CYSTOSCOPY N/A 11/09/2015   Procedure: CYSTOSCOPY;  Surgeon: Honor Loh Ward, MD;  Location: [facility] ORS;  Service: Gynecology;  Laterality: N/A;  . parotid stone      Removal  . REDUCTION MAMMAPLASTY Bilateral 02/2013  . TONSILLECTOMY      Family History  Problem Relation Age of Onset  . Breast cancer Mother 78  . Kidney cancer Father        dx in his mid 32s  . Prostate cancer Maternal Uncle        dx in mid 73s  . Stomach cancer Paternal Aunt        dx in mid 16s  . Diabetes Maternal Grandmother   . Heart disease Maternal Grandmother   . Prostate cancer Maternal Grandfather   . Heart disease Paternal Grandmother   . Heart attack Paternal Grandfather   . Prostate cancer Maternal Uncle        dx in mid 29s  . Prostate cancer Maternal Uncle        dx in mid 93s  . Colon cancer  Maternal Uncle        dx in mid 30s  . Colon cancer Cousin        paternal first cousin dx in his mid 34s  . Colon cancer Cousin        paternal first cousin dx in his mid 31s    Social History:  reports that she has never smoked. She has never used smokeless tobacco. She reports that she does not drink alcohol or use drugs. She is from [place]. She moved to [place] at the end of 03/2015 to be with her family. Her parents live in [place]. She lives with her husband. 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 She has 2 daughters (42 year-old in graduate school, 58 year-old in [place]). Her husband had an MI and was hospitalized in [place].Her husband's name is Kelly Munoz. The patient is alone today.  Allergies:  Allergies  Allergen  Reactions  . Zithromax [Azithromycin] Diarrhea  . Clindamycin/Lincomycin Rash  . Erythromycin Rash    Current Medications: Current Outpatient Medications  Medication Sig Dispense Refill  . carbidopa-levodopa (SINEMET IR) 25-250 MG tablet     . Cholecalciferol (VITAMIN D3) 5000 units CAPS Take 5,000 Units by mouth daily.    Marland Kitchen FISH OIL-KRILL OIL PO Take 1 tablet by mouth daily.    . mesalamine (LIALDA) 1.2 g EC tablet Take 2 tablets (2.4 g total) by mouth daily with breakfast. 180 tablet 3  . metoprolol succinate (TOPROL-XL) 25 MG 24 hr tablet Take 25 mg by mouth daily.    . ramipril (ALTACE) 10 MG capsule Take 10 mg by mouth daily.     . tamoxifen (NOLVADEX) 20 MG tablet TAKE 1 TABLET BY MOUTH  DAILY 90 tablet 3  . fluticasone (FLONASE) 50 MCG/ACT nasal spray Place 1-2 sprays into both nostrils daily as needed for allergies or rhinitis.     No current facility-administered medications for this visit.    Review of Systems  Constitutional: Negative.  Negative for chills, diaphoresis, fever, malaise/fatigue and weight loss (up 1 lb).       Doing "well".   HENT: Negative.  Negative for congestion, ear pain, hearing loss, nosebleeds, sinus pain and sore throat.   Eyes: Negative.  Negative for blurred vision, double vision and pain.  Respiratory: Negative.  Negative for cough, hemoptysis, sputum production, shortness of breath and wheezing.   Cardiovascular: Negative.  Negative for chest pain, palpitations, claudication, leg swelling and PND.  Gastrointestinal: Negative.  Negative for abdominal pain, blood in stool, constipation, diarrhea, melena, nausea and vomiting.       Irritable bowel syndrome.  Colonoscopies switched to every 3 years.  Genitourinary: Negative.  Negative for dysuria, frequency, hematuria and urgency.       S/p TAH/BSO.  Musculoskeletal: Negative for back pain, joint pain and myalgias.  Skin: Negative.  Negative for itching and rash.       Hair loss.  Neurological:  Positive for tremors (only in left foot). Negative for dizziness, tingling, sensory change, speech change, focal weakness, weakness and headaches.       Early stage Parkinson's disease.  Endo/Heme/Allergies: Negative.  Does not bruise/bleed easily.  Psychiatric/Behavioral: Positive for memory loss. Negative for depression and substance abuse. The patient is not nervous/anxious and does not have insomnia.   All other systems reviewed and are negative.  Performance status (ECOG):  1  Vitals Blood pressure (!) 149/95, pulse 97, temperature 98.2 F (36.8  C), temperature source Tympanic, resp. rate 18, height 5' 2.5" (1.588 m), weight 152 lb 3.6 oz (69 kg), SpO2 100 %.   Physical Exam  Constitutional: She is oriented to person, place, and time. She appears well-developed and well-nourished. No distress.  HENT:  Head: Normocephalic and atraumatic.  Mouth/Throat: Oropharynx is clear and moist. No oropharyngeal exudate.  Long brown hair with slight graying. Mask.  Eyes: Pupils are equal, round, and reactive to light. Conjunctivae and EOM are normal. No scleral icterus.  Blue eyes.  Contacts.  Neck: No JVD present.  Cardiovascular: Normal rate, regular rhythm and normal heart sounds.  No murmur heard. Pulmonary/Chest: Effort normal and breath sounds normal. No respiratory distress. She has no wheezes. She has no rales. Right breast exhibits no inverted nipple, no mass, no nipple discharge, no skin change (post-operative and post radiation scarring) and no tenderness. Left breast exhibits no inverted nipple, no mass, no nipple discharge, no skin change (scar tissue superiorly and medially) and no tenderness.  Prominent medial right sided ribs.  Abdominal: Soft. Bowel sounds are normal. She exhibits no distension and no mass. There is no abdominal tenderness. There is no rebound and no guarding.  Musculoskeletal:        General: No edema. Normal range of motion.     Cervical back: Normal range of  motion and neck supple.  Lymphadenopathy:       Head (right side): No preauricular, no posterior auricular and no occipital adenopathy present.       Head (left side): No preauricular, no posterior auricular and no occipital adenopathy present.    She has no cervical adenopathy.    She has no axillary adenopathy.       Right: No inguinal and no supraclavicular adenopathy present.       Left: No inguinal and no supraclavicular adenopathy present.  Neurological: She is alert and oriented to person, place, and time.  Skin: Skin is warm and dry. No rash noted. She is not diaphoretic. No erythema. No pallor.  Psychiatric: She has a normal mood and affect. Her behavior is normal. Judgment and thought content normal.  Nursing note and vitals reviewed.   No visits with results within 3 Day(s) from this visit.  Latest known visit with results is:  Lab on 01/02/2019  Component Date Value Ref Range Status  . SARS-CoV-2, NAA 01/02/2019 Not Detected  Not Detected Final   Comment: This nucleic acid amplification test was developed and its performance characteristics determined by [employer]. Nucleic acid amplification tests include PCR and TMA. This test has not been FDA cleared or approved. This test has been authorized by FDA under an Emergency Use Authorization (EUA). This test is only authorized for the duration of time the declaration that circumstances exist justifying the authorization of the emergency use of in vitro diagnostic tests for detection of SARS-CoV-2 virus and/or diagnosis of COVID-19 infection under section 564(b)(1) of the Act, 21 U.S.C. 440HKV-4(Q) (1), unless the authorization is terminated or revoked sooner. When diagnostic testing is negative, the possibility of a false negative result should be considered in the context of a patient's recent exposures and the presence of clinical signs and symptoms consistent with COVID-19. An individual without symptoms of  COVID-19 and who is not shedding SARS-CoV-2 virus would                           expect to have a negative (not detected) result in  this assay.     Assessment:  Kelly Munoz is a 59 y.o. female with stage IIB Her2/neu + right breast cancers/p right partial mastectomy and sentinel lymph node biopsy on 06/08/2011. 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 There was solid, cribriform and comedo DCIS with necrosis. One of 5 lymph nodes were positive. There was a macro-metastasis in one intramammary lymph node. Tumor was ER positive (88.8%), PR positive (90.93%), and Her2/neu 3+. Ki-67 was 48%. Pathologic stage was T2N1aM0.   My Risk Genetic testing on 07/13/2015 revealed a BRCA2 mutation(c.5946del). 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  She received 6 cycles of TCHchemotherapy beginning 07/11/2011. 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 She completed 1 year of adjuvant Herceptin. At one point, her EF decreased (no records available). EF returned to normal per patient report (last echo was in 2014).  She received radiation(completed before the end of 2013). She begantamoxifenin 01/2012.   CA27.29has been followed: 13.2 on 03/04/2015, 21.2 on 08/14/2015, 19.4 on 02/04/2016, 17.2 on 08/01/2016, 16.3 on 01/25/2017, 16.1 on 01/29/2018, 16.6 on 08/02/2018, and 16.8 on 02/04/2019.  Bilateral breast MRIon 01/30/2017 revealed no evidence of malignancy. Bilateral mammogramon 08/03/2016 revealed No evidence of malignancy. Bilateral diagnostic mammogramon 08/08/2017 revealed prior right lumpectomy with no findings of malignancy in either breast.There were no mammographic or sonographic abnormalities to account for left nipple sensitivity. Bilateral breast MRI on 02/08/2018 revealed no evidence of malignancy within either breast. Postsurgical changeswere presentwithin the  RIGHT breast.  Bilateral screening mammogram on 08/10/2018 revealed no evidence of malignancy.  Bilateral breast MRI on 02/14/2019 revealed postsurgical changes in the right breast. There was no MRI evidence of malignancy seen in either breast.  Bone densityon 03/23/2016 revealed osteopeniawith a T-score of -1.4 in the right femoral neck.  Bone density on 03/27/2018 revealed osteopenia with a T-score of -1.9 at the femur neck right.   She underwent total laparoscopic hysterectomy and bilateral oophorectomyon 11/09/2015. Pathology revealed no evidence of malignancy.  She hasa history ofmildly increased liver function tests. She has fatty liver. She was on a statin drug. Hepatitis B and C serologies were negative on 09/14/2016. She denies any new medication or herbal product.   She was diagnosed with early stage Parkinson's disease.  Tremorshave improved on Sinemet. Paraneoplastic antibody panel was negative on 08/11/2017. She has been followed by Dr.Potter, neurologist.  Symptomatically, she is doing well.  Tremors have improved.  Exam is stable.  She has a mild lymphocytosis.  Plan: 1.   Review [facility] labs from 02/04/2019. 2.Stage IIB Her2/neu + right breast cancer Clinically, she is doing well.              She is tolerating tamoxifen (began 01/2012).              She does not wish to switch to an aromatase inhibitor.               Bilateral mammogram on 08/10/2018 was negative Bilateral breast MRI on 02/14/2019 was negative.  Continue alternating mammogram and breast MRI.             Schedule bilateral mammogram on 08/12/2019. 3.BRCA2 mutation Patient continue close observation.  She remains on tamoxifen.  Check CA125 at next visit. 4.Early stage Parkinson's disease Patient is followed by neurology. Paraneoplastic panel was negative.             Continue to monitor.    5.Osteopenia  Bone density on 03/26/2018 revealed osteopenia with a T score -1.9              Patient on vitamin D alone. Patient notes GI irritation with calcium.               Continue to encourage other forms of calcium. 6.   Mild lymphocytosis  Clinically, she is doing well without B symptoms.  Exam reveals no adenopathy or hepatosplenomegaly.  Review lymphocyte count from 07/27/2017 to 02/04/2019:   ALC 2500 to 2900 to 3200 to 3500.  Discuss differential diagnosis (reactive, monoclonal lymphocytosis of unknown significance, and CLL).   Discuss plans for flow cytometry at next visit.  Encourage patient to follow-up between visits if any concerns. 7.   [facility] labs in 6 months (CBC with diff, CMP, CA27.29, CA125, flow cytometry). 8.   RTC 1 week after labs for MD assessment, review of labs and mammogram.  I discussed the assessment and treatment plan with the patient.  The patient was provided an opportunity to ask questions and all were answered.  The patient agreed with the plan and demonstrated an understanding of the instructions.  The patient was advised to call back if the symptoms worsen or if the condition fails to improve as anticipated.   Lequita Asal, MD, PhD    02/15/2019, 11:21 AM  I, Samul Dada, am acting as a scribe for Lequita Asal, MD.  I, Corinne Mike Gip, MD, have reviewed the above documentation for accuracy and completeness, and I agree with the above.

## 2019-02-14 ENCOUNTER — Ambulatory Visit
Admission: RE | Admit: 2019-02-14 | Discharge: 2019-02-14 | Disposition: A | Discharge status: Home / Self Care | Payer: BLUE CROSS/BLUE SHIELD | Source: Ambulatory Visit | Attending: Hematology and Oncology | Admitting: Hematology and Oncology

## 2019-02-14 ENCOUNTER — Other Ambulatory Visit: Payer: Self-pay

## 2019-02-14 DIAGNOSIS — Z1501 Genetic susceptibility to malignant neoplasm of breast: Secondary | ICD-10-CM | POA: Insufficient documentation

## 2019-02-14 DIAGNOSIS — Z1509 Genetic susceptibility to other malignant neoplasm: Secondary | ICD-10-CM | POA: Insufficient documentation

## 2019-02-14 DIAGNOSIS — Z170 Estrogen receptor positive status [ER+]: Secondary | ICD-10-CM | POA: Insufficient documentation

## 2019-02-14 DIAGNOSIS — C50911 Malignant neoplasm of unspecified site of right female breast: Secondary | ICD-10-CM | POA: Insufficient documentation

## 2019-02-14 MED ORDER — GADOBUTROL 1 MMOL/ML IV SOLN
6.0000 mL | Freq: Once | INTRAVENOUS | Status: AC | PRN
  Administered 2019-02-14: 6 mL via INTRAVENOUS

## 2019-02-15 ENCOUNTER — Encounter: Payer: Self-pay | Admitting: Hematology and Oncology

## 2019-02-15 ENCOUNTER — Inpatient Hospital Stay: Payer: BLUE CROSS/BLUE SHIELD | Attending: Hematology and Oncology | Admitting: Hematology and Oncology

## 2019-02-15 VITALS — BP 149/95 | HR 97 | Temp 98.2°F | Resp 18 | Ht 62.5 in | Wt 152.2 lb

## 2019-02-15 DIAGNOSIS — K760 Fatty (change of) liver, not elsewhere classified: Secondary | ICD-10-CM | POA: Diagnosis not present

## 2019-02-15 DIAGNOSIS — I429 Cardiomyopathy, unspecified: Secondary | ICD-10-CM | POA: Diagnosis not present

## 2019-02-15 DIAGNOSIS — C50911 Malignant neoplasm of unspecified site of right female breast: Secondary | ICD-10-CM | POA: Diagnosis not present

## 2019-02-15 DIAGNOSIS — D72820 Lymphocytosis (symptomatic): Secondary | ICD-10-CM | POA: Insufficient documentation

## 2019-02-15 DIAGNOSIS — I209 Angina pectoris, unspecified: Secondary | ICD-10-CM | POA: Diagnosis not present

## 2019-02-15 DIAGNOSIS — I10 Essential (primary) hypertension: Secondary | ICD-10-CM | POA: Insufficient documentation

## 2019-02-15 DIAGNOSIS — Z9221 Personal history of antineoplastic chemotherapy: Secondary | ICD-10-CM | POA: Insufficient documentation

## 2019-02-15 DIAGNOSIS — M8580 Other specified disorders of bone density and structure, unspecified site: Secondary | ICD-10-CM | POA: Insufficient documentation

## 2019-02-15 DIAGNOSIS — Z923 Personal history of irradiation: Secondary | ICD-10-CM | POA: Insufficient documentation

## 2019-02-15 DIAGNOSIS — Z79810 Long term (current) use of selective estrogen receptor modulators (SERMs): Secondary | ICD-10-CM | POA: Insufficient documentation

## 2019-02-15 DIAGNOSIS — Z1509 Genetic susceptibility to other malignant neoplasm: Secondary | ICD-10-CM

## 2019-02-15 DIAGNOSIS — Z148 Genetic carrier of other disease: Secondary | ICD-10-CM | POA: Insufficient documentation

## 2019-02-15 DIAGNOSIS — Z170 Estrogen receptor positive status [ER+]: Secondary | ICD-10-CM | POA: Diagnosis not present

## 2019-02-15 DIAGNOSIS — Z79899 Other long term (current) drug therapy: Secondary | ICD-10-CM | POA: Insufficient documentation

## 2019-02-15 DIAGNOSIS — M85851 Other specified disorders of bone density and structure, right thigh: Secondary | ICD-10-CM

## 2019-02-15 DIAGNOSIS — G20 Parkinson's disease: Secondary | ICD-10-CM | POA: Insufficient documentation

## 2019-02-15 DIAGNOSIS — Z7189 Other specified counseling: Secondary | ICD-10-CM

## 2019-02-15 DIAGNOSIS — Z803 Family history of malignant neoplasm of breast: Secondary | ICD-10-CM | POA: Insufficient documentation

## 2019-02-15 DIAGNOSIS — Z1501 Genetic susceptibility to malignant neoplasm of breast: Secondary | ICD-10-CM | POA: Diagnosis not present

## 2019-02-15 NOTE — Progress Notes (Signed)
No new changes noted today 

## 2019-04-01 IMAGING — CR DG LUMBAR SPINE 2-3V
3 series · 3 of 3 positions shown · non-contrast
Comparison: None.

CLINICAL DATA: 55-year-old female with right lower lumbar back pain
radiating to the right leg for 4 days. Personal history of 5058
breast cancer.

EXAM:
LUMBAR SPINE - 2-3 VIEW

[l-spine ap]
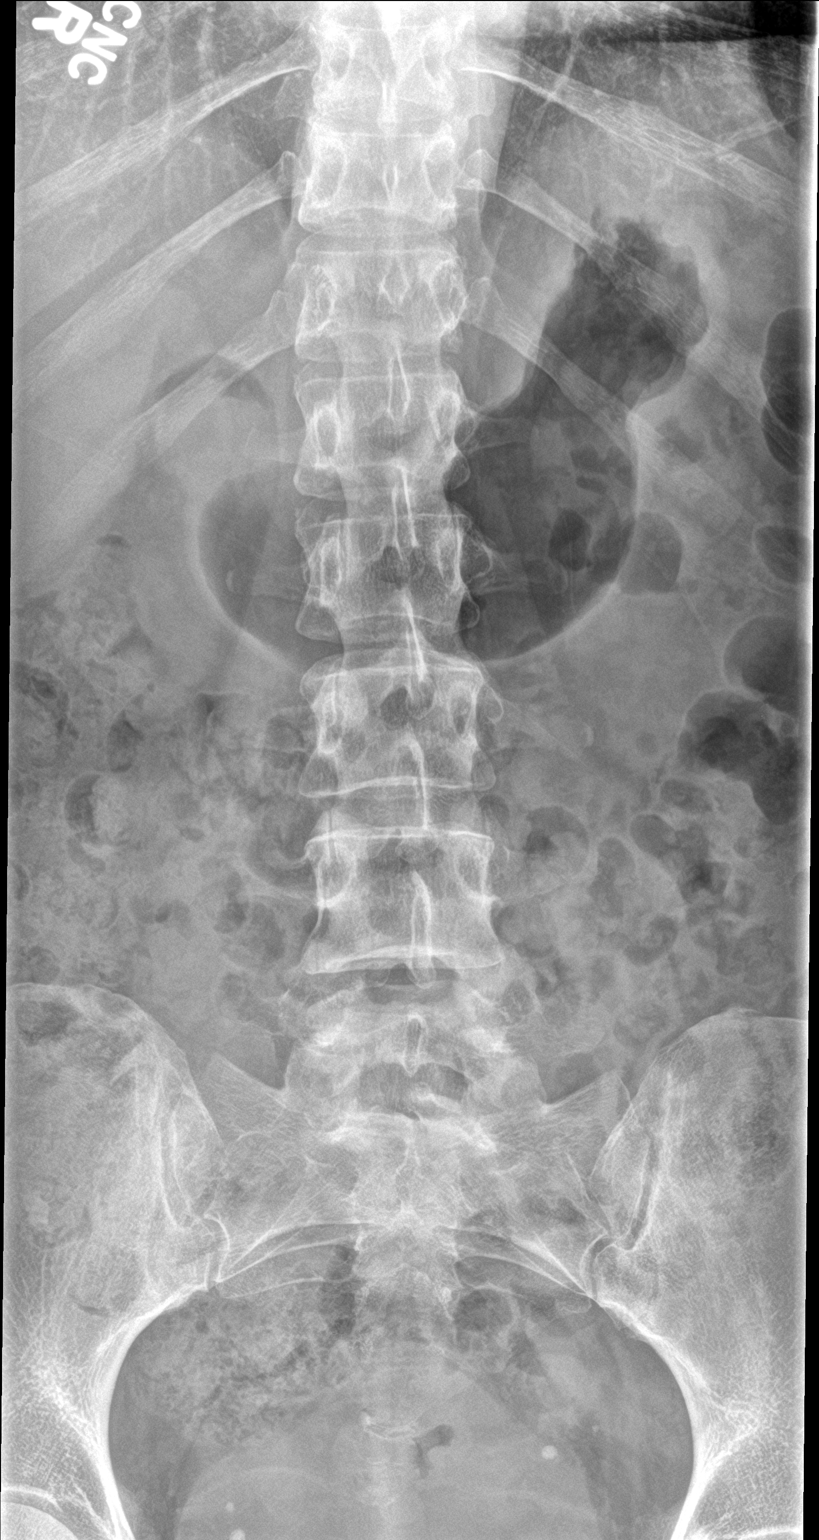

[l-spine lat]
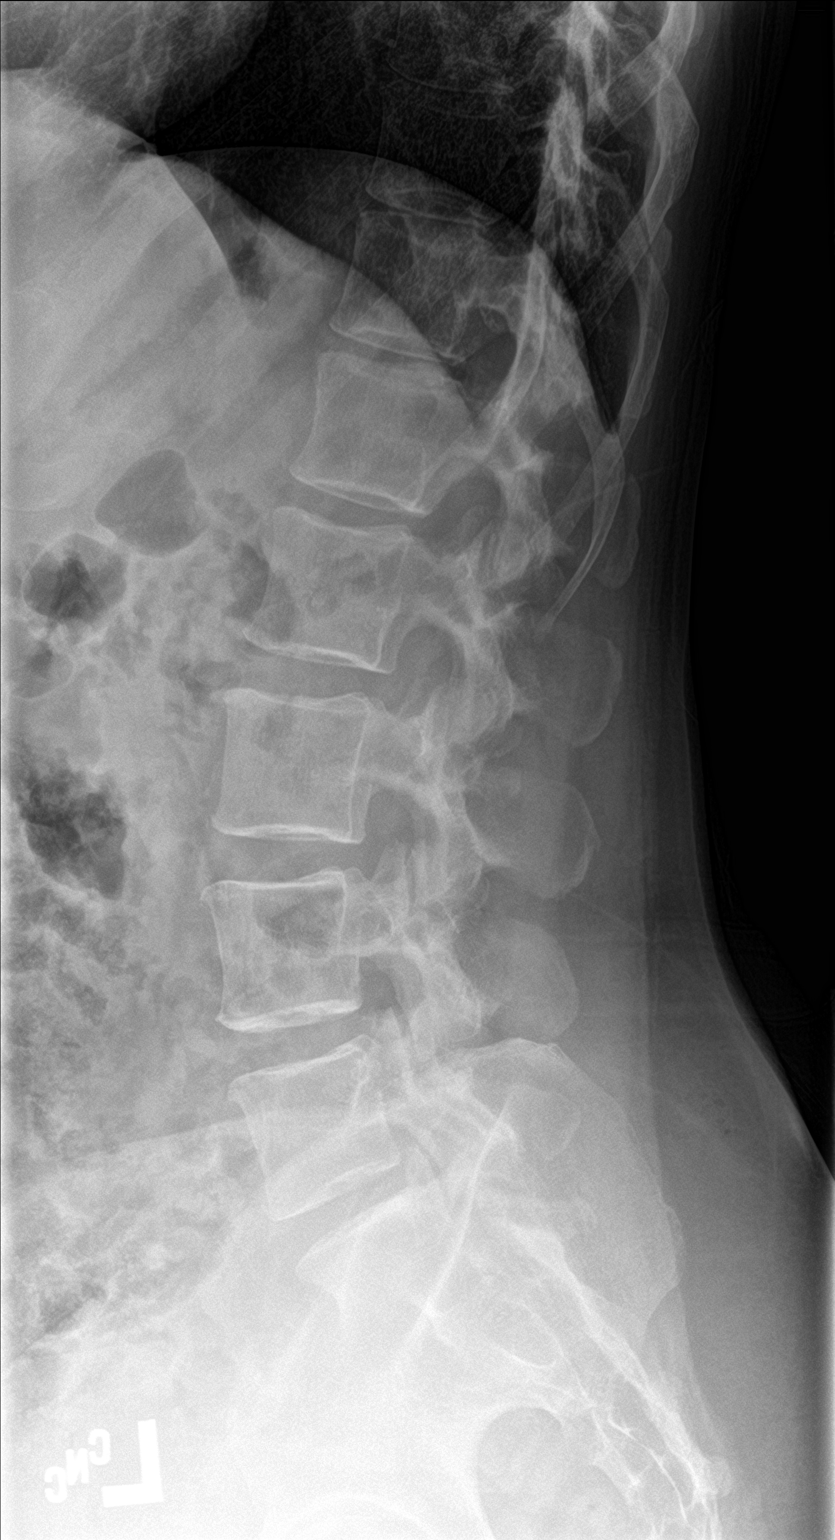

[l-spine spot]
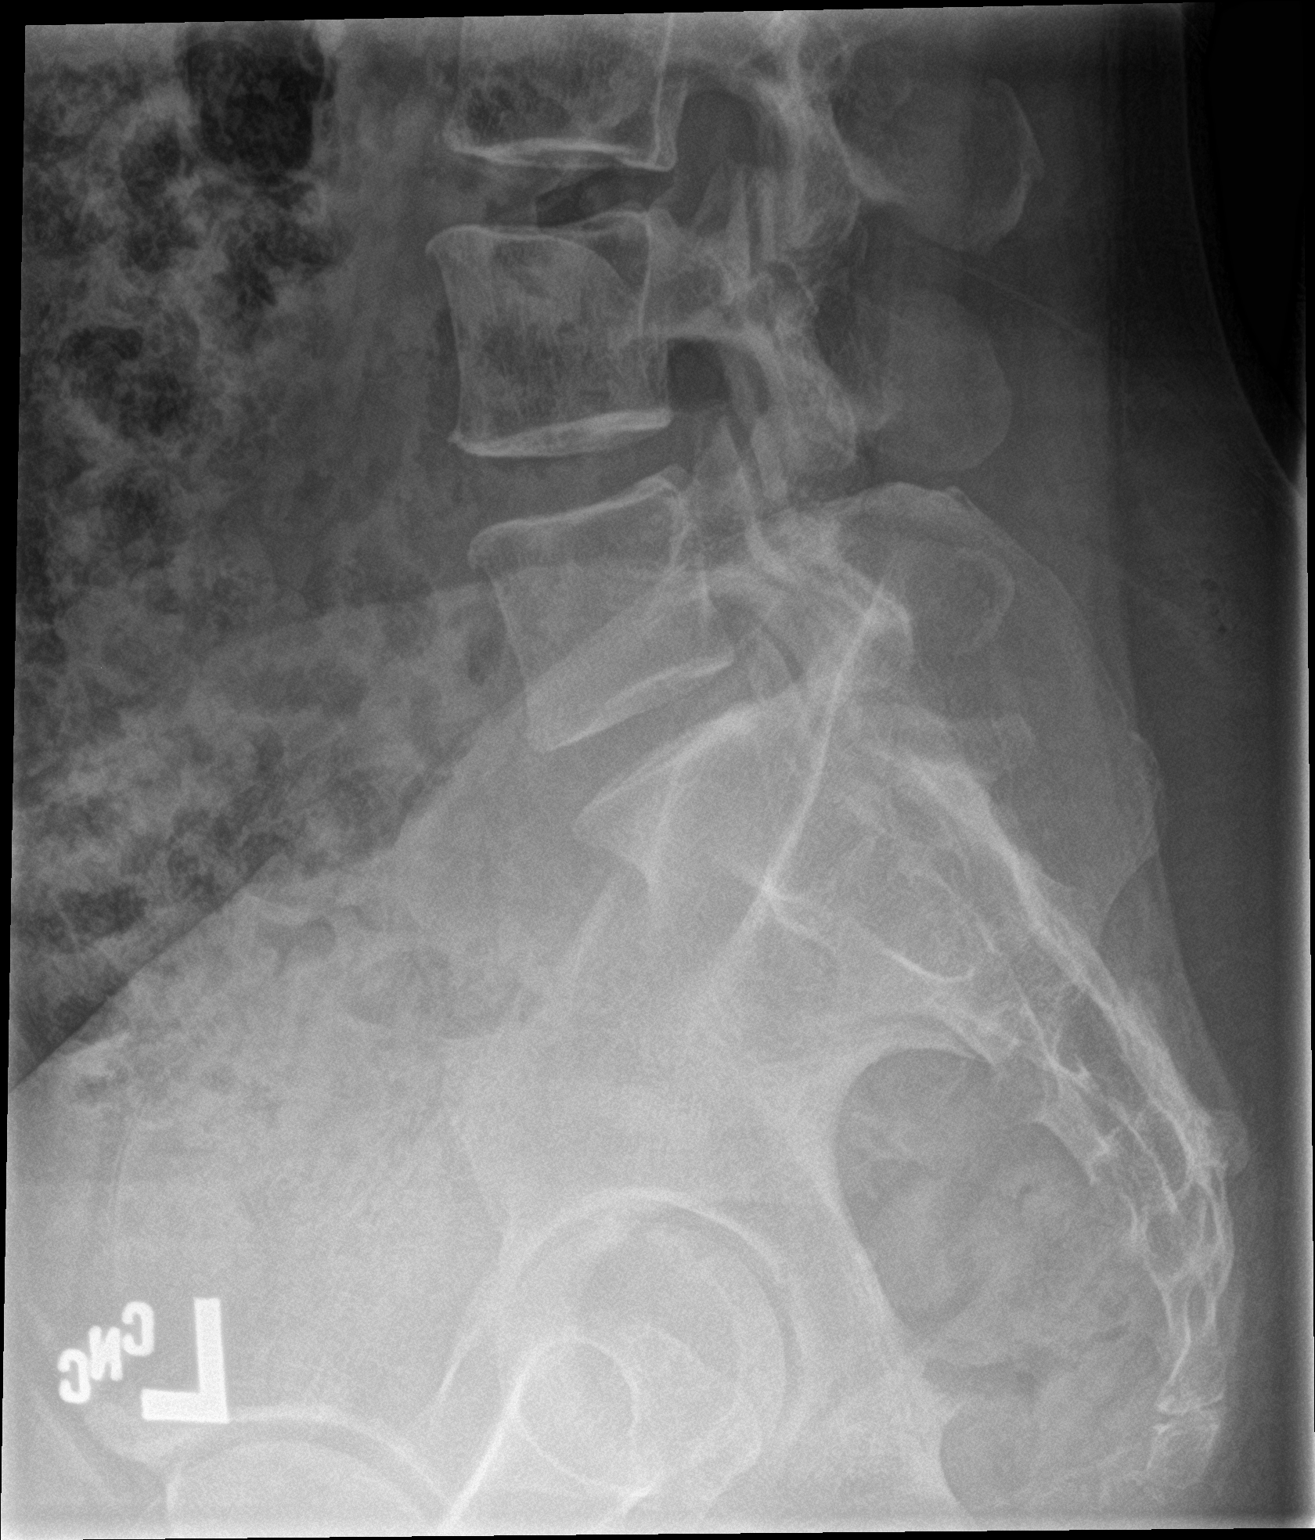

[3 of 3 positions shown; findings below may reference images not displayed]

FINDINGS: Normal lumbar segmentation. Bone mineralization is within normal
limits. Normal vertebral height and alignment aside from mild
dextroconvex curvature at the thoracolumbar junction. Lumbar disc
spaces are preserved. Minimal lumbar endplate spurring. The sacral
ala and SI joints appear normal. Visible lower thoracic levels
appear intact. Incidental probable small pelvic phleboliths.
IMPRESSION: Normal for age radiographic appearance of the lumbar spine.

## 2019-08-06 ENCOUNTER — Telehealth: Payer: Self-pay | Admitting: *Deleted

## 2019-08-06 ENCOUNTER — Other Ambulatory Visit: Payer: Self-pay

## 2019-08-06 ENCOUNTER — Telehealth: Payer: Self-pay

## 2019-08-06 ENCOUNTER — Encounter: Payer: Self-pay | Admitting: Hematology and Oncology

## 2019-08-06 NOTE — Telephone Encounter (Signed)
[place] [facility] called stating that they need the test code for the Flow Cytometry that blood was drawn for yesterday. They cannot run the test without this code. Please return her call [phone]

## 2019-08-06 NOTE — Telephone Encounter (Signed)
Spoke with [place] to see what i could do to help, She informed me that she needed to know the name of the test for the flow cytometry with the code. I was able to speak with Dr Mike Gip and she informed me that she would like Flow Cytometry panel Leukemia Lymphoma work up which the code is [phone]. I have informed Ms [place] of the codes she needed.

## 2019-08-07 ENCOUNTER — Encounter: Payer: Self-pay | Admitting: Hematology and Oncology

## 2019-08-08 ENCOUNTER — Encounter: Payer: Self-pay | Admitting: Hematology and Oncology

## 2019-08-08 ENCOUNTER — Telehealth: Payer: Self-pay

## 2019-08-08 NOTE — Telephone Encounter (Signed)
Spoke with the patient to inform her that per Dr Mike Gip her calcium is slightly high and if she is currently taking calcium ? she reply no she is not taking any calcium supp. The patient was understanding and agreeable.

## 2019-08-08 NOTE — Telephone Encounter (Signed)
-----   Message from Lequita Asal, MD sent at 08/08/2019  4:16 PM EDT ----- Regarding: Please call patient  Calcium 10.3 (slightly high).  Is she taking supplemental calcium?  If so, stop.   M

## 2019-08-12 ENCOUNTER — Ambulatory Visit
Admission: RE | Admit: 2019-08-12 | Discharge: 2019-08-12 | Disposition: A | Discharge status: Home / Self Care | Payer: BLUE CROSS/BLUE SHIELD | Source: Ambulatory Visit | Attending: Hematology and Oncology | Admitting: Hematology and Oncology

## 2019-08-12 ENCOUNTER — Other Ambulatory Visit: Payer: Self-pay

## 2019-08-12 DIAGNOSIS — C50911 Malignant neoplasm of unspecified site of right female breast: Secondary | ICD-10-CM | POA: Diagnosis not present

## 2019-08-12 DIAGNOSIS — Z1231 Encounter for screening mammogram for malignant neoplasm of breast: Secondary | ICD-10-CM | POA: Insufficient documentation

## 2019-08-12 DIAGNOSIS — Z1501 Genetic susceptibility to malignant neoplasm of breast: Secondary | ICD-10-CM | POA: Insufficient documentation

## 2019-08-12 DIAGNOSIS — Z1509 Genetic susceptibility to other malignant neoplasm: Secondary | ICD-10-CM | POA: Insufficient documentation

## 2019-08-12 DIAGNOSIS — Z170 Estrogen receptor positive status [ER+]: Secondary | ICD-10-CM | POA: Diagnosis not present

## 2019-08-15 ENCOUNTER — Encounter: Payer: Self-pay | Admitting: Hematology and Oncology

## 2019-08-15 NOTE — Progress Notes (Signed)
[facility]  [address] [place], [place] [address] Phone: [phone]  Fax: [phone]   Clinic Day:  08/19/2019  Referring physician: Kirk Ruths, MD  Chief Complaint: Kelly Munoz is a 59 y.o. female with stage IIB Her2/neu + right breast cancer and a BRCA2 mutation who is seen for 6 month assessment.  HPI: The patient was last seen in the medical oncology clinic on 02/15/2019. At that time, she was doing well.  Tremors had improved.  Exam was stable.  She had a mild lymphocytosis. Hematocrit was 40.3, hemoglobin 13.4, platelets 215,000, WBC 8,000. CMP was normal. CA 125 was 16.8.  Screening bilateral mammogram on 08/12/2019 revealed no evidence of malignancy.  [facility] labs on 08/05/2019 showed a hematocrit of 40.2, hemoglobin 13.4, platelets 214,000, WBC 8,600 (ANC 4500, ALC 3300). Calcium was 10.3. AST was 52 and  ALT 60. CA 125 was 13.4. CA 27.29 was 20.8.  During the interim, she has been "good." She reports night sweats that wake her up at night. She has been diagnosed with Parkinsonism. The patient stopped taking her statin when her liver enzymes were increasing. She did not fast before her labs today.  She started taking oral Vitamin B12 a month ago.   Past Medical History:  Diagnosis Date  . Breast cancer (Kelly Munoz)   . Breast cancer, right (Kelly Munoz) 06/08/2011  . Family history of breast cancer   . Family history of colon cancer   . Family history of prostate cancer   . History of IBS   . History of kidney stones   . Hypertension   . 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([place])   . Personal history of chemotherapy 2013   RIGHT lumpectomy  . Personal history of radiation therapy 2013   RIGHT lumpectomy    Past Surgical History:  Procedure Laterality Date  . ABDOMINAL HYSTERECTOMY    . BREAST BIOPSY Right 2013   lumpectomy chem and rad  . BREAST LUMPECTOMY Right 2013  . COLONOSCOPY WITH PROPOFOL N/A 02/06/2017    Procedure: COLONOSCOPY WITH PROPOFOL;  Surgeon: Lollie Sails, MD;  Location: [facility] ENDOSCOPY;  Service: Endoscopy;  Laterality: N/A;  . CYSTOSCOPY N/A 11/09/2015   Procedure: CYSTOSCOPY;  Surgeon: Honor Loh Ward, MD;  Location: [facility] ORS;  Service: Gynecology;  Laterality: N/A;  . parotid stone      Removal  . REDUCTION MAMMAPLASTY Bilateral 02/2013  . TONSILLECTOMY      Family History  Problem Relation Age of Onset  . Breast cancer Mother 54  . Kidney cancer Father        dx in his mid 13s  . Prostate cancer Maternal Uncle        dx in mid 33s  . Stomach cancer Paternal Aunt        dx in mid 62s  . Diabetes Maternal Grandmother   . Heart disease Maternal Grandmother   . Prostate cancer Maternal Grandfather   . Heart disease Paternal Grandmother   . Heart attack Paternal Grandfather   . Prostate cancer Maternal Uncle        dx in mid 26s  . Prostate cancer Maternal Uncle        dx in mid 36s  . Colon cancer Maternal Uncle        dx in mid 82s  . Colon cancer Cousin        paternal first cousin dx in his mid 48s  . Colon cancer Cousin  paternal first cousin dx in his mid 46s    Social History:  reports that she has never smoked. She has never used smokeless tobacco. She reports that she does not drink alcohol and does not use drugs. She is from [place]. She moved to [place] at the end of 03/2015 to be with her family. Her parents live in [place]. She lives with her husband. 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 She has 2 daughters (24 year-old in graduate school, 58 year-old in [place]). Her husband had an MI and was hospitalized in [place].Her husband's name is Kelly Munoz. The patient is alone today.  Allergies:  Allergies  Allergen Reactions  . Zithromax [Azithromycin] Diarrhea  . Clindamycin/Lincomycin Rash  . Erythromycin Rash    Current Medications: Current Outpatient Medications  Medication  Sig Dispense Refill  . carbidopa-levodopa (SINEMET IR) 25-250 MG tablet     . Cholecalciferol (VITAMIN D3) 5000 units CAPS Take 5,000 Units by mouth daily.    . cyanocobalamin 1000 MCG tablet Take by mouth.    Marland Kitchen FISH OIL-KRILL OIL PO Take 1 tablet by mouth daily.    . mesalamine (LIALDA) 1.2 g EC tablet Take 2 tablets (2.4 g total) by mouth daily with breakfast. 180 tablet 3  . metoprolol succinate (TOPROL-XL) 25 MG 24 hr tablet Take 25 mg by mouth daily.    . tamoxifen (NOLVADEX) 20 MG tablet TAKE 1 TABLET BY MOUTH  DAILY 90 tablet 3  . fluticasone (FLONASE) 50 MCG/ACT nasal spray Place 1-2 sprays into both nostrils daily as needed for allergies or rhinitis. (Patient not taking: Reported on 08/19/2019)     No current facility-administered medications for this visit.    Review of Systems  Constitutional: Positive for diaphoresis (night sweats). Negative for chills, fever, malaise/fatigue and weight loss (stable).       Feels "good."  HENT: Negative.  Negative for congestion, ear discharge, ear pain, hearing loss, nosebleeds, sinus pain, sore throat and tinnitus.   Eyes: Negative.  Negative for blurred vision, double vision and pain.  Respiratory: Negative.  Negative for cough, hemoptysis, sputum production, shortness of breath and wheezing.   Cardiovascular: Negative.  Negative for chest pain, palpitations, claudication, leg swelling and PND.  Gastrointestinal: Negative.  Negative for abdominal pain, blood in stool, constipation, diarrhea, heartburn, melena, nausea and vomiting.       Irritable bowel syndrome.  Colonoscopies every 3 years.  Genitourinary: Negative.  Negative for dysuria, frequency, hematuria and urgency.       S/p TAH/BSO.  Musculoskeletal: Negative.  Negative for back pain, joint pain, myalgias and neck pain.  Skin: Negative.  Negative for itching and rash.  Neurological: Positive for tremors. Negative for dizziness, tingling, sensory change, speech change, focal weakness,  weakness and headaches.       Parkinsonism.  Endo/Heme/Allergies: Negative.  Does not bruise/bleed easily.  Psychiatric/Behavioral: Positive for memory loss. Negative for depression and substance abuse. The patient is not nervous/anxious and does not have insomnia.   All other systems reviewed and are negative.  Performance status (ECOG):  1  Vitals Blood pressure (!) 149/86, pulse 100, temperature 98.9 F (37.2 C), temperature source Tympanic, resp. rate 16, weight 152 lb 8.9 oz (69.2 kg), SpO2 100 %.   Physical Exam Vitals and nursing note reviewed.  Constitutional:      General: She is not in acute distress.    Appearance: She is well-developed. She is not diaphoretic.  HENT:     Head: Normocephalic and atraumatic.     Comments: Shoulder length blond hair with graying.    Mouth/Throat:     Pharynx: No oropharyngeal exudate.  Eyes:     General: No scleral icterus.    Conjunctiva/sclera: Conjunctivae normal.     Pupils: Pupils are equal, round, and reactive to light.     Comments: Blue eyes with contacts.  Neck:     Vascular: No JVD.  Cardiovascular:     Rate and Rhythm: Normal rate and regular rhythm.     Heart sounds: Normal heart sounds. No murmur heard.   Pulmonary:     Effort: Pulmonary effort is normal. No respiratory distress.     Breath sounds: Normal breath sounds. No wheezing or rales.  Chest:     Breasts:        Right: No inverted nipple, mass, nipple discharge, skin change (chronic changes in the inferior quadrant and medially) or tenderness.        Left: Tenderness present. No inverted nipple, mass, nipple discharge or skin change (nodularity at inferior aspect of breast).  Abdominal:     General: Bowel sounds are normal. There is no distension.     Palpations: Abdomen is soft. There is no hepatomegaly, splenomegaly or mass.     Tenderness: There is no abdominal tenderness. There is no guarding or rebound.  Musculoskeletal:        General: Normal range of  motion.     Cervical back: Normal range of motion and neck supple.  Lymphadenopathy:     Head:     Right side of head: No preauricular, posterior auricular or occipital adenopathy.     Left side of head: No preauricular, posterior auricular or occipital adenopathy.     Cervical: No cervical adenopathy.     Upper Body:     Right upper body: No supraclavicular adenopathy.     Left upper body: No supraclavicular adenopathy.     Lower Body: No right inguinal adenopathy. No left inguinal adenopathy.  Skin:    General: Skin is warm and dry.     Coloration: Skin is not pale.     Findings: No erythema or rash.  Neurological:     Mental Status: She is alert and oriented to person, place, and time.  Psychiatric:        Behavior: Behavior normal.        Thought Content: Thought content normal.        Judgment: Judgment normal.    No visits with results within 3 Day(s) from this visit.  Latest known visit with results is:  Lab on 01/02/2019  Component Date Value Ref Range Status  . SARS-CoV-2, NAA 01/02/2019 Not Detected  Not Detected Final   Comment: This nucleic acid amplification test was developed and its performance characteristics determined by [employer]. Nucleic acid amplification tests include PCR and TMA. This test has not been FDA cleared or approved. This test has been authorized by FDA under an Emergency Use Authorization (EUA). This test is only authorized for the duration of time the declaration that circumstances exist justifying the authorization of the emergency use of in vitro diagnostic tests for detection of SARS-CoV-2 virus and/or diagnosis of COVID-19 infection under section 564(b)(1) of the Act, 21 U.S.C. 300PQZ-3(A) (1), unless the authorization is terminated or revoked sooner. When diagnostic testing is negative, the possibility of a false negative result should be considered in the context of a patient's recent exposures and  the presence of clinical  signs and symptoms consistent with COVID-19. An individual without symptoms of COVID-19 and who is not shedding SARS-CoV-2 virus would                           expect to have a negative (not detected) result in this assay.     Assessment:  Kelly Munoz is a 59 y.o. female with stage IIB Her2/neu + right breast cancers/p right partial mastectomy and sentinel lymph node biopsy on 06/08/2011. 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 There was solid, cribriform and comedo DCIS with necrosis. One of 5 lymph nodes were positive. There was a macro-metastasis in one intramammary lymph node. Tumor was ER positive (88.8%), PR positive (90.93%), and Her2/neu 3+. Ki-67 was 48%. Pathologic stage was T2N1aM0.   My Risk Genetic testing on 07/13/2015 revealed a BRCA2 mutation(c.5946del). 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  She received 6 cycles of TCHchemotherapy beginning 07/11/2011. 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 She completed 1 year of adjuvant Herceptin. At one point, her EF decreased (no records available). EF returned to normal per patient report (last echo was in 2014).  She received radiation(completed before the end of 2013). She begantamoxifenin 01/2012.   CA27.29has been followed: 13.2 on 03/04/2015, 21.2 on 08/14/2015, 19.4 on 02/04/2016, 17.2 on 08/01/2016, 16.3 on 01/25/2017, 16.1 on 01/29/2018, 16.6 on 08/02/2018, 16.8 on 02/04/2019, and 20.8 on 08/05/2019.  Bilateral breast MRIon 01/30/2017 revealed no evidence of malignancy. Bilateral mammogramon 08/03/2016 revealed No evidence of malignancy. Bilateral diagnostic mammogramon 08/08/2017 revealed prior right lumpectomy with no findings of malignancy in either breast.There were no mammographic or sonographic abnormalities to account for left nipple sensitivity. Bilateral breast MRI on 02/08/2018  revealed no evidence of malignancy within either breast. Postsurgical changeswere presentwithin the RIGHT breast.  Bilateral screening mammogram on 08/10/2018 revealed no evidence of malignancy.  Bilateral breast MRI on 02/14/2019 revealed postsurgical changes in the right breast. There was no MRI evidence of malignancy seen in either breast.  Screening bilateral mammogram on 08/12/2019 revealed no evidence of malignancy.  Bone densityon 03/23/2016 revealed osteopeniawith a T-score of -1.4 in the right femoral neck.  Bone density on 03/27/2018 revealed osteopenia with a T-score of -1.9 at the femur neck right.   She underwent total laparoscopic hysterectomy and bilateral oophorectomyon 11/09/2015. Pathology revealed no evidence of malignancy.  CA125 has been followed: 13.4 on 08/05/2019.  She hasa history ofmildly increased liver function tests. She has fatty liver. She was on a statin drug. Hepatitis B and C serologies were negative on 09/14/2016. She denies any new medication or herbal product.   She was diagnosed with early stage Parkinson's disease.  Tremorshave improved on Sinemet. Paraneoplastic antibody panel was negative on 08/11/2017. She has been followed by Dr.Potter, neurologist.  Symptomatically, she denies any breast concerns.  Exam reveals fibrocystic changes no discrete mass.  Plan: 1.   Review [facility] labs from 08/05/2019. 2.Stage IIB Her2/neu + right breast cancer Clinically, she continues to do well.             She remains on tamoxifen (began 01/2012).    Declines switch to an aromatase inhibitor.               Screening bilateral mammogram on 08/12/2019 revealed no evidence of malignancy. Continue alternating mammogram and breast MRI.  Schedule breast MRI in 02/2020. 3.BRCA2 mutation Continue ongoing close observation.  She continues tamoxifen.  CA125 was 13.4 on 08/05/2019. 4.Early stage  Parkinson's disease She is followed by neurology Paraneoplastic panel was negative. 5.Osteopenia Bone density on 03/26/2018 revealed osteopenia with a T score -1.9              She remains on vitamin D. Oral calcium causes GI irritation              Continue to recommend foods rich in calcium. 6.   Mild lymphocytosis  Clinically, she denies any fevers, sweats or weight loss  Exam reveals no adenopathy or hepatosplenomegaly.  Absolute lymphocyte count from last CBC was 3300.  Differential diagnosis includes reactive, monoclonal lymphocytosis of unknown significance, and CLL.  Reorder flow cytometry. 7.   Elevated LFTs  Please make appt with Dr Allen Norris (well known to him) re:  increased LFTs. 8.   [facility] labs in 6 months (CBC with diff, CMP, CA27.29, CA125). 9.   Breast MRI on 02/14/2020. 10.   RTC 1 week after labs for MD assessment, review of [facility] labs, and breast MRI.  I discussed the assessment and treatment plan with the patient.  The patient was provided an opportunity to ask questions and all were answered.  The patient agreed with the plan and demonstrated an understanding of the instructions.  The patient was advised to call back if the symptoms worsen or if the condition fails to improve as anticipated.   Lequita Asal, MD, PhD    08/19/2019, 10:01 AM  I, Mirian Mo Tufford, am acting as a Education administrator for Lequita Asal, MD.  I, [place] Mike Gip, MD, have reviewed the above documentation for accuracy and completeness, and I agree with the above.

## 2019-08-19 ENCOUNTER — Ambulatory Visit: Payer: BLUE CROSS/BLUE SHIELD | Admitting: Hematology and Oncology

## 2019-08-19 ENCOUNTER — Encounter: Payer: Self-pay | Admitting: Hematology and Oncology

## 2019-08-19 ENCOUNTER — Inpatient Hospital Stay: Payer: BLUE CROSS/BLUE SHIELD | Attending: Hematology and Oncology | Admitting: Hematology and Oncology

## 2019-08-19 ENCOUNTER — Other Ambulatory Visit: Payer: Self-pay

## 2019-08-19 ENCOUNTER — Telehealth: Payer: Self-pay

## 2019-08-19 VITALS — BP 149/86 | HR 100 | Temp 98.9°F | Resp 16 | Wt 152.6 lb

## 2019-08-19 DIAGNOSIS — Z170 Estrogen receptor positive status [ER+]: Secondary | ICD-10-CM

## 2019-08-19 DIAGNOSIS — M85851 Other specified disorders of bone density and structure, right thigh: Secondary | ICD-10-CM | POA: Diagnosis not present

## 2019-08-19 DIAGNOSIS — Z79810 Long term (current) use of selective estrogen receptor modulators (SERMs): Secondary | ICD-10-CM | POA: Insufficient documentation

## 2019-08-19 DIAGNOSIS — R945 Abnormal results of liver function studies: Secondary | ICD-10-CM | POA: Diagnosis not present

## 2019-08-19 DIAGNOSIS — Z1509 Genetic susceptibility to other malignant neoplasm: Secondary | ICD-10-CM

## 2019-08-19 DIAGNOSIS — Z9221 Personal history of antineoplastic chemotherapy: Secondary | ICD-10-CM | POA: Diagnosis not present

## 2019-08-19 DIAGNOSIS — Z1501 Genetic susceptibility to malignant neoplasm of breast: Secondary | ICD-10-CM

## 2019-08-19 DIAGNOSIS — R7989 Other specified abnormal findings of blood chemistry: Secondary | ICD-10-CM | POA: Insufficient documentation

## 2019-08-19 DIAGNOSIS — R61 Generalized hyperhidrosis: Secondary | ICD-10-CM | POA: Insufficient documentation

## 2019-08-19 DIAGNOSIS — C50911 Malignant neoplasm of unspecified site of right female breast: Secondary | ICD-10-CM | POA: Insufficient documentation

## 2019-08-19 DIAGNOSIS — Z7189 Other specified counseling: Secondary | ICD-10-CM | POA: Insufficient documentation

## 2019-08-19 DIAGNOSIS — G20 Parkinson's disease: Secondary | ICD-10-CM | POA: Insufficient documentation

## 2019-08-19 DIAGNOSIS — M8580 Other specified disorders of bone density and structure, unspecified site: Secondary | ICD-10-CM | POA: Diagnosis not present

## 2019-08-19 DIAGNOSIS — D72820 Lymphocytosis (symptomatic): Secondary | ICD-10-CM

## 2019-08-19 HISTORY — DX: Lymphocytosis (symptomatic): D72.820

## 2019-08-19 NOTE — Progress Notes (Signed)
Patient here for oncology follow-up appointment, expresses no complaints or concerns at this time.    

## 2019-08-26 ENCOUNTER — Encounter: Payer: Self-pay | Admitting: Hematology and Oncology

## 2019-10-16 ENCOUNTER — Other Ambulatory Visit: Payer: Self-pay

## 2019-10-16 ENCOUNTER — Ambulatory Visit: Payer: BLUE CROSS/BLUE SHIELD | Admitting: Gastroenterology

## 2019-10-16 ENCOUNTER — Encounter: Payer: Self-pay | Admitting: Gastroenterology

## 2019-10-16 VITALS — BP 145/77 | HR 97 | Ht 62.5 in | Wt 153.4 lb

## 2019-10-16 DIAGNOSIS — R748 Abnormal levels of other serum enzymes: Secondary | ICD-10-CM

## 2019-10-16 NOTE — Progress Notes (Signed)
Primary Care Physician: Kirk Ruths, MD  Primary Gastroenterologist:  Dr. Lucilla Lame  Chief Complaint  Patient presents with  . Elevated Hepatic Enzymes    HPI: Kelly Munoz is a 59 y.o. female here o see me after being seen by her oncologist for follow-up of breast cancer in the right breast with a history of leukocytosis.  The patient had been seen in the past by Dr. Gustavo Lah for a diagnosis of Crohn's disease.  The patient's last colonoscopy was in January 2019.  The patient's lab work in January showed her liver enzymes to be normal with a repeat in March that showed an isolated increase in AST of 60 with a repeat in July of liver enzymes that showed the AST to be 52 and the ALT to be elevated at 60.  The patient has had a diagnosis of fatty liver. The patient saw me in 2020. The patient reports that she had abnormal liver enzymes but then went on a diet and lost weight and her liver enzymes came back to normal.  The patient states that they had been normal for some time and they just recently went up.  She denies being started on any new medications around the time this started.  She also denies gaining any weight.  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   Past Medical History:  Diagnosis Date  . Breast cancer ([place])   . Breast cancer, right ([place]) 06/08/2011  . Family history of breast cancer   . Family history of colon cancer   . Family history of prostate cancer   . History of IBS   . History of kidney stones   . Hypertension   . 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([place])   . Personal history of chemotherapy 2013   RIGHT lumpectomy  . Personal history of radiation therapy 2013   RIGHT lumpectomy    Current Outpatient Medications  Medication Sig Dispense Refill  . carbidopa-levodopa (SINEMET IR) 25-250 MG tablet     . Cholecalciferol (VITAMIN D3) 5000 units CAPS Take 5,000 Units by mouth daily.    . cyanocobalamin 1000 MCG tablet Take  by mouth.    Marland Kitchen FISH OIL-KRILL OIL PO Take 1 tablet by mouth daily.    . mesalamine (LIALDA) 1.2 g EC tablet Take 2 tablets (2.4 g total) by mouth daily with breakfast. 180 tablet 3  . metoprolol succinate (TOPROL-XL) 25 MG 24 hr tablet Take 25 mg by mouth daily.    . tamoxifen (NOLVADEX) 20 MG tablet TAKE 1 TABLET BY MOUTH  DAILY 90 tablet 3  . fluticasone (FLONASE) 50 MCG/ACT nasal spray Place 1-2 sprays into both nostrils daily as needed for allergies or rhinitis. (Patient not taking: Reported on 08/19/2019)     No current facility-administered medications for this visit.    Allergies as of 10/16/2019 - Review Complete 10/16/2019  Allergen Reaction Noted  . Zithromax [azithromycin] Diarrhea 08/11/2015  . Clindamycin/lincomycin Rash 08/11/2015  . Erythromycin Rash 08/11/2015    ROS:  General: Negative for anorexia, weight loss, fever, chills, fatigue, weakness. ENT: Negative for hoarseness, difficulty swallowing , nasal congestion. CV: Negative for chest pain, angina, palpitations, dyspnea on exertion, peripheral edema.  Respiratory: Negative for dyspnea at rest, dyspnea on exertion, cough, sputum, wheezing.  GI: See history of present illness. GU:  Negative for dysuria, hematuria, urinary incontinence, urinary frequency, nocturnal urination.  Endo: Negative for unusual weight change.    Physical Examination:  BP (!) 145/77   Pulse 97   Ht 5' 2.5" (1.588 m)   Wt 153 lb 6.4 oz (69.6 kg)   BMI 27.61 kg/m   General: Well-nourished, well-developed in no acute distress.  Eyes: No icterus. Conjunctivae pink. Lungs: Clear to auscultation bilaterally. Non-labored. Heart: Regular rate and rhythm, no murmurs rubs or gallops.  Abdomen: Bowel sounds are normal, nontender, nondistended, no hepatosplenomegaly or masses, no abdominal bruits or hernia , no rebound or guarding.   Extremities: No lower extremity edema. No clubbing or deformities. Neuro: Alert and oriented x 3.  Grossly  intact. Skin: Warm and dry, no jaundice.   Psych: Alert and cooperative, normal mood and affect.  Labs:    Imaging Studies: No results found.  Assessment and Plan:   Kelly Munoz is a 59 y.o. y/o female who comes in with a history of abnormal liver enzymes that came back to normal and a history of fatty liver.  The patient now has abnormal liver enzymes again.  The patient's last blood work was done in July and the patient will have her labs repeated to see if her liver enzymes are still elevated.  She will also have lab sent for other possible cause of abnormal liver enzymes.  The patient has been explained the plan agrees with it.     Lucilla Lame, MD. Marval Regal    Note: This dictation was prepared with Dragon dictation along with smaller phrase technology. Any transcriptional errors that result from this process are unintentional.

## 2019-10-17 LAB — ANA: Anti Nuclear Antibody (ANA): NEGATIVE

## 2019-10-17 LAB — HEPATIC FUNCTION PANEL
ALT: 47 IU/L — ABNORMAL HIGH (ref 0–32)
AST: 55 IU/L — ABNORMAL HIGH (ref 0–40)
Albumin: 4.4 g/dL (ref 3.8–4.9)
Alkaline Phosphatase: 68 IU/L (ref 44–121)
Bilirubin Total: <0.2 mg/dL (ref 0.0–1.2)
Bilirubin, Direct: <0.1 mg/dL (ref 0.00–0.40)
Total Protein: 7 g/dL (ref 6.0–8.5)

## 2019-10-17 LAB — MITOCHONDRIAL ANTIBODIES: Mitochondrial Ab: <20 Units (ref 0.0–20.0)

## 2019-10-17 LAB — IRON AND TIBC
Iron Saturation: 21 % (ref 15–55)
Iron: 82 ug/dL (ref 27–159)
Total Iron Binding Capacity: 392 ug/dL (ref 250–450)
UIBC: 310 ug/dL (ref 131–425)

## 2019-10-17 LAB — GAMMA GT: GGT: 27 IU/L (ref 0–60)

## 2019-10-17 LAB — FERRITIN: Ferritin: 333 ng/mL — ABNORMAL HIGH (ref 15–150)

## 2019-10-17 LAB — ALPHA-1-ANTITRYPSIN: A-1 Antitrypsin: 138 mg/dL (ref 101–187)

## 2019-10-17 LAB — ANTI-SMOOTH MUSCLE ANTIBODY, IGG: Smooth Muscle Ab: 29 Units — ABNORMAL HIGH (ref 0–19)

## 2019-10-17 LAB — CERULOPLASMIN: Ceruloplasmin: 35.7 mg/dL (ref 19.0–39.0)

## 2019-10-21 ENCOUNTER — Encounter: Payer: Self-pay | Admitting: Hematology and Oncology

## 2019-10-21 ENCOUNTER — Telehealth: Payer: Self-pay

## 2019-10-21 NOTE — Telephone Encounter (Signed)
Pt notified of results via mychart.  

## 2019-10-21 NOTE — Telephone Encounter (Signed)
-----   Message from Lucilla Lame, MD sent at 10/21/2019 12:17 PM EDT ----- The patient know that the smooth muscle antibody was weakly positive.  The GGT which is an indicator of liver inflammation was normal but the smooth muscle antibody is a curious finding.  I believe to make sure that this is not a liver process she should be set up for a liver biopsy.

## 2019-10-24 ENCOUNTER — Other Ambulatory Visit: Payer: Self-pay

## 2019-10-24 DIAGNOSIS — R748 Abnormal levels of other serum enzymes: Secondary | ICD-10-CM

## 2019-10-26 ENCOUNTER — Ambulatory Visit: Payer: BLUE CROSS/BLUE SHIELD | Attending: Internal Medicine

## 2019-10-26 DIAGNOSIS — Z23 Encounter for immunization: Secondary | ICD-10-CM

## 2019-10-26 NOTE — Progress Notes (Signed)
   Covid-19 Vaccination Clinic  Name:  Kelly Munoz    MRN: [id number] DOB: Jul 12, 1960  10/26/2019  Kelly Munoz was observed post Covid-19 immunization for 15 minutes without incident. She was provided with Vaccine Information Sheet and instruction to access the V-Safe system.   Kelly Munoz was instructed to call 911 with any severe reactions post vaccine: Marland Kitchen Difficulty breathing  . Swelling of face and throat  . A fast heartbeat  . A bad rash all over body  . Dizziness and weakness

## 2019-10-29 ENCOUNTER — Other Ambulatory Visit: Payer: Self-pay

## 2019-10-29 MED ORDER — DICYCLOMINE HCL 10 MG PO CAPS: 10.0000 mg | ORAL_CAPSULE | Freq: Three times a day (TID) | ORAL | 0 refills | Status: DC | PRN

## 2019-11-04 ENCOUNTER — Other Ambulatory Visit: Payer: Self-pay | Admitting: Physician Assistant

## 2019-11-04 ENCOUNTER — Other Ambulatory Visit: Payer: Self-pay | Admitting: Gastroenterology

## 2019-11-05 ENCOUNTER — Ambulatory Visit
Admission: RE | Admit: 2019-11-05 | Discharge: 2019-11-05 | Disposition: A | Discharge status: Home / Self Care | Payer: BLUE CROSS/BLUE SHIELD | Source: Ambulatory Visit | Attending: Gastroenterology | Admitting: Gastroenterology

## 2019-11-05 ENCOUNTER — Other Ambulatory Visit: Payer: Self-pay

## 2019-11-05 DIAGNOSIS — K769 Liver disease, unspecified: Secondary | ICD-10-CM | POA: Diagnosis not present

## 2019-11-05 DIAGNOSIS — R748 Abnormal levels of other serum enzymes: Secondary | ICD-10-CM | POA: Diagnosis not present

## 2019-11-05 LAB — PROTIME-INR
INR: 1.1 (ref 0.8–1.2)
Prothrombin Time: 13.3 seconds (ref 11.4–15.2)

## 2019-11-05 LAB — CBC
HCT: 41 % (ref 36.0–46.0)
Hemoglobin: 13.6 g/dL (ref 12.0–15.0)
MCH: 30 pg (ref 26.0–34.0)
MCHC: 33.2 g/dL (ref 30.0–36.0)
MCV: 90.3 fL (ref 80.0–100.0)
Platelets: 226 10*3/uL (ref 150–400)
RBC: 4.54 MIL/uL (ref 3.87–5.11)
RDW: 12.8 % (ref 11.5–15.5)
WBC: 8.3 10*3/uL (ref 4.0–10.5)
nRBC: 0 % (ref 0.0–0.2)

## 2019-11-05 MED ORDER — SODIUM CHLORIDE 0.9 % IV SOLN: INTRAVENOUS | Status: DC

## 2019-11-05 MED ORDER — MIDAZOLAM HCL 2 MG/2ML IJ SOLN
INTRAMUSCULAR | Status: AC
  Filled 2019-11-05: qty 2

## 2019-11-05 MED ORDER — MIDAZOLAM HCL 2 MG/2ML IJ SOLN
INTRAMUSCULAR | Status: AC | PRN
  Administered 2019-11-05 (×2): 1 mg via INTRAVENOUS

## 2019-11-05 MED ORDER — FENTANYL CITRATE (PF) 100 MCG/2ML IJ SOLN
INTRAMUSCULAR | Status: AC
  Filled 2019-11-05: qty 2

## 2019-11-05 MED ORDER — FENTANYL CITRATE (PF) 100 MCG/2ML IJ SOLN
INTRAMUSCULAR | Status: AC | PRN
Start: 2019-11-05 — End: 2019-11-05
  Administered 2019-11-05 (×2): 50 ug via INTRAVENOUS

## 2019-11-05 NOTE — H&P (Signed)
Chief Complaint: Patient was seen in consultation today for liver biopsy at the request of [place]  Referring Physician(s): [place]  Supervising Physician: Corrie Mckusick  Patient Status: [facility] - Out-pt  History of Present Illness: Kelly Munoz is a 59 y.o. female being worked up for elevated liver enzymes. She is referred for image guided random liver biopsy. PMHx, meds, labs, imaging, allergies reviewed. Feels well, no recent fevers, chills, illness. Has been NPO today as directed.    Past Medical History:  Diagnosis Date  . Breast cancer ([place])   . Breast cancer, right ([place]) 06/08/2011  . Family history of breast cancer   . Family history of colon cancer   . Family history of prostate cancer   . History of IBS   . History of kidney stones   . Hypertension   . 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([place])   . Personal history of chemotherapy 2013   RIGHT lumpectomy  . Personal history of radiation therapy 2013   RIGHT lumpectomy    Past Surgical History:  Procedure Laterality Date  . ABDOMINAL HYSTERECTOMY    . BREAST BIOPSY Right 2013   lumpectomy chem and rad  . BREAST LUMPECTOMY Right 2013  . COLONOSCOPY WITH PROPOFOL N/A 02/06/2017   Procedure: COLONOSCOPY WITH PROPOFOL;  Surgeon: Lollie Sails, MD;  Location: [facility] ENDOSCOPY;  Service: Endoscopy;  Laterality: N/A;  . CYSTOSCOPY N/A 11/09/2015   Procedure: CYSTOSCOPY;  Surgeon: Honor Loh Ward, MD;  Location: [facility] ORS;  Service: Gynecology;  Laterality: N/A;  . parotid stone      Removal  . REDUCTION MAMMAPLASTY Bilateral 02/2013  . TONSILLECTOMY      Allergies: Zithromax [azithromycin], Clindamycin/lincomycin, and Erythromycin  Medications: Prior to Admission medications   Medication Sig Start Date End Date Taking? Authorizing Provider  carbidopa-levodopa (SINEMET IR) 25-250 MG tablet  08/06/18   [provider]  Cholecalciferol (VITAMIN D3) 5000 units CAPS Take 5,000 Units by  mouth daily.    [provider]  cyanocobalamin 1000 MCG tablet Take by mouth. 06/25/19 06/24/20  [provider]  dicyclomine (BENTYL) 10 MG capsule Take 1 capsule (10 mg total) by mouth 3 (three) times daily as needed for spasms. 10/29/19   Lucilla Lame, MD  FISH OIL-KRILL OIL PO Take 1 tablet by mouth daily.    [provider]  fluticasone (FLONASE) 50 MCG/ACT nasal spray Place 1-2 sprays into both nostrils daily as needed for allergies or rhinitis. Patient not taking: Reported on 08/19/2019    [provider]  mesalamine (LIALDA) 1.2 g EC tablet Take 2 tablets (2.4 g total) by mouth daily with breakfast. 11/14/18   Lucilla Lame, MD  metoprolol succinate (TOPROL-XL) 25 MG 24 hr tablet Take 25 mg by mouth daily.    [provider]  tamoxifen (NOLVADEX) 20 MG tablet TAKE 1 TABLET BY MOUTH  DAILY 10/15/18   Lequita Asal, MD     Family History  Problem Relation Age of Onset  . Breast cancer Mother 69  . Kidney cancer Father        dx in his mid 89s  . Prostate cancer Maternal Uncle        dx in mid 31s  . Stomach cancer Paternal Aunt        dx in mid 39s  . Diabetes Maternal Grandmother   . Heart disease Maternal Grandmother   . Prostate cancer Maternal Grandfather   . Heart disease Paternal Grandmother   . Heart attack  Paternal Grandfather   . Prostate cancer Maternal Uncle        dx in mid 70s  . Prostate cancer Maternal Uncle        dx in mid 56s  . Colon cancer Maternal Uncle        dx in mid 37s  . Colon cancer Cousin        paternal first cousin dx in his mid 61s  . Colon cancer Cousin        paternal first cousin dx in his mid 73s    Social History   Socioeconomic History  . Marital status: Married    Spouse name: Not on file  . Number of children: Not on file  . Years of education: Not on file  . Highest education level: Not on file  Occupational History  . Not on file  Tobacco Use  . Smoking status: Never Smoker  .  Smokeless tobacco: Never Used  Vaping Use  . Vaping Use: Never assessed  Substance and Sexual Activity  . Alcohol use: No  . Drug use: No  . Sexual activity: Not on file  Other Topics Concern  . Not on file  Social History Narrative  . Not on file   Social Determinants of Health   Financial Resource Strain:   . Difficulty of Paying Living Expenses: Not on file  Food Insecurity:   . Worried About Charity fundraiser in the Last Year: Not on file  . Ran Out of Food in the Last Year: Not on file  Transportation Needs:   . Lack of Transportation (Medical): Not on file  . Lack of Transportation (Non-Medical): Not on file  Physical Activity:   . Days of Exercise per Week: Not on file  . Minutes of Exercise per Session: Not on file  Stress:   . Feeling of Stress : Not on file  Social Connections:   . Frequency of Communication with Friends and Family: Not on file  . Frequency of Social Gatherings with Friends and Family: Not on file  . Attends Religious Services: Not on file  . Active Member of Clubs or Organizations: Not on file  . Attends Archivist Meetings: Not on file  . Marital Status: Not on file    Review of Systems: A 12 point ROS discussed and pertinent positives are indicated in the HPI above.  All other systems are negative.  Review of Systems  Vital Signs: There were no vitals taken for this visit.  Physical Exam Constitutional:      Appearance: Normal appearance. She is not ill-appearing.  HENT:     Mouth/Throat:     Mouth: Mucous membranes are moist.     Pharynx: Oropharynx is clear.  Cardiovascular:     Rate and Rhythm: Normal rate and regular rhythm.     Heart sounds: Normal heart sounds.  Pulmonary:     Effort: Pulmonary effort is normal. No respiratory distress.     Breath sounds: Normal breath sounds.  Abdominal:     General: Abdomen is flat. There is no distension.     Palpations: Abdomen is soft.     Tenderness: There is no abdominal  tenderness.  Skin:    General: Skin is warm and dry.     Coloration: Skin is not jaundiced.  Neurological:     General: No focal deficit present.     Mental Status: She is alert and oriented to person, place, and time.  Psychiatric:  Mood and Affect: Mood normal.        Thought Content: Thought content normal.        Judgment: Judgment normal.     Imaging: No results found.  Labs:  CBC: No results for input(s): WBC, HGB, HCT, PLT in the last 8760 hours.  COAGS: No results for input(s): INR, APTT in the last 8760 hours.  BMP: No results for input(s): NA, K, CL, CO2, GLUCOSE, BUN, CALCIUM, CREATININE, GFRNONAA, GFRAA in the last 8760 hours.  Invalid input(s): CMP  LIVER FUNCTION TESTS: Recent Labs    10/16/19 0927  BILITOT <0.2  AST 55*  ALT 47*  ALKPHOS 68  PROT 7.0  ALBUMIN 4.4    TUMOR MARKERS: No results for input(s): AFPTM, CEA, CA199, CHROMGRNA in the last 8760 hours.  Assessment and Plan: Elevated liver enzymes For US guided random liver biopsy Risks and benefits of liver biopsy was discussed with the patient and/or patient's family including, but not limited to bleeding, infection, damage to adjacent structures or low yield requiring additional tests.  All of the questions were answered and there is agreement to proceed.  Consent signed and in chart.    Thank you for this interesting consult.  I greatly enjoyed meeting PAUL TORPEY and look forward to participating in their care.  A copy of this report was sent to the requesting provider on this date.  Electronically Signed: Ascencion Dike, PA-C 11/05/2019, 9:24 AM   I spent a total of 20 minutes in face to face in clinical consultation, greater than 50% of which was counseling/coordinating care for liver bx

## 2019-11-05 NOTE — Progress Notes (Signed)
Patient clinically stable post Liver Biopsy per Dr Earleen Newport, tolerated well. Awake/alert and oriented post procedure. Vitals stable pre and post procedure. Denies complaints at this time. Received Versed 2 mg along with Fentanyl 100 mcg IV for procedure. Report given to Genelle Bal Rn in specials post procedure.

## 2019-11-05 NOTE — Procedures (Signed)
Interventional Radiology Procedure Note  Procedure: US guided biopsy of liver for medical liver dz Complications: None EBL: None Recommendations: - Bedrest 2 hours.   - Routine wound care - Follow up pathology - Advance diet   Signed,  Corrie Mckusick, DO

## 2019-11-07 LAB — SURGICAL PATHOLOGY

## 2019-11-12 ENCOUNTER — Other Ambulatory Visit: Payer: Self-pay | Admitting: Hematology and Oncology

## 2019-11-20 ENCOUNTER — Encounter: Payer: Self-pay | Admitting: Gastroenterology

## 2019-11-20 ENCOUNTER — Other Ambulatory Visit: Payer: Self-pay

## 2019-11-20 ENCOUNTER — Ambulatory Visit: Payer: BLUE CROSS/BLUE SHIELD | Admitting: Gastroenterology

## 2019-11-20 VITALS — BP 158/96 | HR 96 | Ht 62.5 in | Wt 151.8 lb

## 2019-11-20 DIAGNOSIS — K7581 Nonalcoholic steatohepatitis (NASH): Secondary | ICD-10-CM | POA: Diagnosis not present

## 2019-11-21 NOTE — Progress Notes (Signed)
Primary Care Physician: Kirk Ruths, MD  Primary Gastroenterologist:  Dr. Lucilla Lame  Chief Complaint  Patient presents with  . Follow up liver biopsy results    HPI: Kelly Munoz is a 59 y.o. female here for follow-up of her fatty liver disease.  The patient underwent a liver biopsy that confirmed the patient to have fatty liver disease.  There is no other cause for the abnormal liver enzymes found on the biopsy. The patient has been trying to lose weight and states that she is not having any issues at present time.  Past Medical History:  Diagnosis Date  . Breast cancer ([place])   . Breast cancer, right ([place]) 06/08/2011  . Family history of breast cancer   . Family history of colon cancer   . Family history of prostate cancer   . History of IBS   . History of kidney stones   . Hypertension   . 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([place])   . Personal history of chemotherapy 2013   RIGHT lumpectomy  . Personal history of radiation therapy 2013   RIGHT lumpectomy    Current Outpatient Medications  Medication Sig Dispense Refill  . carbidopa-levodopa (SINEMET IR) 25-250 MG tablet     . Cholecalciferol (VITAMIN D3) 5000 units CAPS Take 5,000 Units by mouth daily.    . cyanocobalamin 1000 MCG tablet Take by mouth.    . dicyclomine (BENTYL) 10 MG capsule Take 1 capsule (10 mg total) by mouth 3 (three) times daily as needed for spasms. 30 capsule 0  . FISH OIL-KRILL OIL PO Take 1 tablet by mouth daily.    . mesalamine (LIALDA) 1.2 g EC tablet TAKE 2 TABLETS BY MOUTH  DAILY WITH BREAKFAST 180 tablet 3  . metoprolol succinate (TOPROL-XL) 25 MG 24 hr tablet Take 25 mg by mouth daily.    . tamoxifen (NOLVADEX) 20 MG tablet TAKE 1 TABLET BY MOUTH  DAILY 90 tablet 3  . fluticasone (FLONASE) 50 MCG/ACT nasal spray Place 1-2 sprays into both nostrils daily as needed for allergies or rhinitis. (Patient not taking: Reported on 08/19/2019)     No current facility-administered  medications for this visit.    Allergies as of 11/20/2019 - Review Complete 11/20/2019  Allergen Reaction Noted  . Zithromax [azithromycin] Diarrhea 08/11/2015  . Clindamycin/lincomycin Rash 08/11/2015  . Erythromycin Rash 08/11/2015    ROS:  General: Negative for anorexia, weight loss, fever, chills, fatigue, weakness. ENT: Negative for hoarseness, difficulty swallowing , nasal congestion. CV: Negative for chest pain, angina, palpitations, dyspnea on exertion, peripheral edema.  Respiratory: Negative for dyspnea at rest, dyspnea on exertion, cough, sputum, wheezing.  GI: See history of present illness. GU:  Negative for dysuria, hematuria, urinary incontinence, urinary frequency, nocturnal urination.  Endo: Negative for unusual weight change.    Physical Examination:   BP (!) 158/96   Pulse 96   Ht 5' 2.5" (1.588 m)   Wt 151 lb 12.8 oz (68.9 kg)   BMI 27.32 kg/m   General: Well-nourished, well-developed in no acute distress.  Eyes: No icterus. Conjunctivae pink. Extremities: No lower extremity edema. No clubbing or deformities. Neuro: Alert and oriented x 3.  Grossly intact. Skin: Warm and dry, no jaundice.   Psych: Alert and cooperative, normal mood and affect.  Labs:    Imaging Studies: US BIOPSY (LIVER)  Result Date: 11/05/2019 INDICATION: 59 year old female referred for medical liver biopsy EXAM: IMAGE GUIDED MEDICAL LIVER BIOPSY MEDICATIONS: None. ANESTHESIA/SEDATION: Moderate (conscious)  sedation was employed during this procedure. A total of Versed 2.0 mg and Fentanyl 100 mcg was administered intravenously. Moderate Sedation Time: 10 minutes. The patient's level of consciousness and vital signs were monitored continuously by radiology nursing throughout the procedure under my direct supervision. FLUOROSCOPY TIME:  None COMPLICATIONS: None PROCEDURE: Informed written consent was obtained from the patient after a thorough discussion of the procedural risks, benefits  and alternatives. All questions were addressed. Maximal Sterile Barrier Technique was utilized including caps, mask, sterile gowns, sterile gloves, sterile drape, hand hygiene and skin antiseptic. A timeout was performed prior to the initiation of the procedure. Ultrasound survey of the right liver lobe performed with images stored and sent to PACs. The right lower thorax/right upper abdomen was prepped with chlorhexidine in a sterile fashion, and a sterile drape was applied covering the operative field. A sterile gown and sterile gloves were used for the procedure. Local anesthesia was provided with 1% Lidocaine. The patient was prepped and draped sterilely and the skin and subcutaneous tissues were generously infiltrated with 1% lidocaine. A 17 gauge introducer needle was then advanced under ultrasound guidance in an intercostal location into the right liver lobe. The stylet was removed, and multiple separate 18 gauge core biopsy were retrieved. Samples were placed into formalin for transportation to the lab. Gel-Foam pledgets were then infused with a small amount of saline for assistance with hemostasis. The needle was removed, and a final ultrasound image was performed. The patient tolerated the procedure well and remained hemodynamically stable throughout. No complications were encountered and no significant blood loss was encounter. IMPRESSION: Status post ultrasound-guided medical liver biopsy. Signed, Dulcy Fanny. Dellia Nims, RPVI [facility] Radiology Electronically Signed   By: Corrie Mckusick D.O.   On: 11/05/2019 12:47    Assessment and Plan:   Kelly Munoz is a 59 y.o. y/o female Who comes in today discussed the findings on her liver biopsy.  The patient has been explained the liver biopsy and has been told about diet and weight loss.  The patient also is concerned about further monitoring her Crohn's disease and states that she is due for  colonoscopy.  The patient will be set up with Dr. Marius Ditch for further follow-up of her Crohn's disease since that his her specialty.  The patient has been explained this and agrees with the plan.     Lucilla Lame, MD. Marval Regal    Note: This dictation was prepared with Dragon dictation along with smaller phrase technology. Any transcriptional errors that result from this process are unintentional.

## 2019-12-30 ENCOUNTER — Ambulatory Visit: Payer: BLUE CROSS/BLUE SHIELD | Admitting: Gastroenterology

## 2019-12-30 ENCOUNTER — Encounter: Payer: Self-pay | Admitting: Gastroenterology

## 2019-12-30 VITALS — BP 139/86 | HR 96 | Temp 98.1°F | Ht 62.5 in | Wt 155.2 lb

## 2019-12-30 DIAGNOSIS — Z1502 Genetic susceptibility to malignant neoplasm of ovary: Secondary | ICD-10-CM | POA: Diagnosis not present

## 2019-12-30 DIAGNOSIS — K523 Indeterminate colitis: Secondary | ICD-10-CM

## 2019-12-30 DIAGNOSIS — Z1501 Genetic susceptibility to malignant neoplasm of breast: Secondary | ICD-10-CM

## 2019-12-30 DIAGNOSIS — K7581 Nonalcoholic steatohepatitis (NASH): Secondary | ICD-10-CM | POA: Diagnosis not present

## 2019-12-30 DIAGNOSIS — Z1509 Genetic susceptibility to other malignant neoplasm: Secondary | ICD-10-CM

## 2019-12-30 NOTE — Progress Notes (Signed)
[name], MD [address]  [place], [place] [address]  Main: [phone]  Fax: [phone]    Gastroenterology Consultation  Referring Provider:     Kirk Ruths, MD Primary Care Physician:  Kirk Ruths, MD Primary Gastroenterologist:  Dr. Lucilla Lame Reason for Consultation:     Indeterminate colitis        HPI:   Kelly Munoz is a 59 y.o. female referred by Dr. Ouida Sills, Ocie Cornfield, MD  for consultation & management of indeterminate colitis.  Patient has been treated for ulcerative colitis/Crohn's colitis since age of 63.  As a child, she was told that she had ileitis.  She has been maintained on mesalamine for several years.  Currently on Lialda 2.4 g daily.  She was under the care of a gastroenterologist in [place] for 22 years.  She was closely followed by [facility] clinic gastroenterology for several years until recently when she switched her care to [facility] gastroenterology.  She was originally evaluated by Dr. Allen Norris for elevated transaminases, elevated ASMA, underwent liver biopsy which confirmed fatty liver only.  She is referred to me due to her history of indeterminate colitis.  Apparently, patient reports that she has been undergoing surveillance colonoscopies, every 5 years in the beginning followed by every 2 to 3 years.  Her last colonoscopy was in 2019 by Dr. Gustavo Lah, it was endoscopically normal, histology revealed focal mild active ileitis with no evidence of chronicity. Patient is a breast cancer survivor, on tamoxifen which is also resulted in weight gain Patient reports that she has been following anti-inflammatory diet in order to control her GI symptoms.  During flareups, depending on what she eats, she experiences abdominal bloating, nonbloody diarrhea Her CBC is normal, LFTs revealed mildly elevated AST and ALT  NSAIDs: None  Antiplts/Anticoagulants/Anti thrombotics: None  Strong family history of colon cancer in  maternal uncle in mid 30s, first cousin in mid 21s  GI Procedures:  Colonoscopy 02/06/2017 - Diverticulosis in the sigmoid colon, in the descending colon and in the transverse colon. - One less than 1 mm polyp in the rectum, removed with a cold biopsy forceps. Resected and retrieved. - The examined portion of the ileum was normal. - Biopsies were taken with a cold forceps from the cecum, ascending colon, transverse colon, descending colon, sigmoid colon and rectum for evaluation of microscopic colitis.  DIAGNOSIS:  A. COLON, GRANULAR MUCOSA AT ILEOCECAL VALVE; COLD BIOPSY:  - FOCAL MILD ACTIVE ILEITIS.  - NEGATIVE FOR DYSPLASIA AND MALIGNANCY.   B. RANDOM TERMINAL ILEUM; COLD BIOPSY:  - FOCAL MILD ACTIVE ILEITIS.  - NEGATIVE FOR DYSPLASIA AND MALIGNANCY.   C. COLON, RANDOM CECUM; COLD BIOPSY:  - NO PATHOLOGIC CHANGE.   D. COLON, RANDOM ASCENDING; COLD BIOPSY:  - NO PATHOLOGIC CHANGE.   E. RANDOM TRANSVERSE COLON; COLD BIOPSY:  - NO PATHOLOGIC CHANGE.   F. RANDOM DESCENDING COLON; COLD BIOPSY:  - NO PATHOLOGIC CHANGE.   G. RANDOM SIGMOID COLON; COLD BIOPSY:  - MILD CRYPT IRREGULARITIES.  - NEGATIVE FOR DYSPLASIA AND MALIGNANCY.   H. RANDOM RECTUM; COLD BIOPSY:  - MILD CRYPT IRREGULARITIES.  - NEGATIVE FOR DYSPLASIA AND MALIGNANCY.   I. RECTUM POLYP; COLD BIOPSY:  - POLYPOID COLONIC MUCOSA NEGATIVE FOR DYSPLASIA AND MALIGNANCY.     DIAGNOSIS:  A. LIVER, RIGHT LOBE; ULTRASOUND-GUIDED CORE NEEDLE BIOPSY:  - BENIGN HEPATIC PARENCHYMA WITH MODERATE MACROVESICULAR STEATOSIS, MILD  PORTAL INFLAMMATION WITH FOCAL LOBULAR ACTIVITY, AND FOCAL BALLOONING  DEGENERATION.  - NEGATIVE FOR MALIGNANCY.   Past Medical History:  Diagnosis Date  . Breast cancer ([place])   . Breast cancer, right ([place]) 06/08/2011  . Family history of breast cancer   . Family history of colon cancer   . Family history of prostate cancer   . History of IBS   . History of kidney stones   .  Hypertension   . 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([place])   . Personal history of chemotherapy 2013   RIGHT lumpectomy  . Personal history of radiation therapy 2013   RIGHT lumpectomy    Past Surgical History:  Procedure Laterality Date  . ABDOMINAL HYSTERECTOMY    . BREAST BIOPSY Right 2013   lumpectomy chem and rad  . BREAST LUMPECTOMY Right 2013  . COLONOSCOPY WITH PROPOFOL N/A 02/06/2017   Procedure: COLONOSCOPY WITH PROPOFOL;  Surgeon: Lollie Sails, MD;  [facility];  Service: Endoscopy;  Laterality: N/A;  . CYSTOSCOPY N/A 11/09/2015   Procedure: CYSTOSCOPY;  Surgeon: Honor Loh Ward, MD;  Location: [facility] ORS;  Service: Gynecology;  Laterality: N/A;  . parotid stone      Removal  . parotid stones Left   . REDUCTION MAMMAPLASTY Bilateral 02/2013  . TONSILLECTOMY       Current Outpatient Medications:  .  carbidopa-levodopa (SINEMET IR) 25-250 MG tablet, , Disp: , Rfl:  .  Cholecalciferol (VITAMIN D3) 5000 units CAPS, Take 5,000 Units by mouth daily., Disp: , Rfl:  .  cyanocobalamin 1000 MCG tablet, Take by mouth., Disp: , Rfl:  .  dicyclomine (BENTYL) 10 MG capsule, Take 1 capsule (10 mg total) by mouth 3 (three) times daily as needed for spasms., Disp: 30 capsule, Rfl: 0 .  FISH OIL-KRILL OIL PO, Take 1 tablet by mouth daily., Disp: , Rfl:  .  fluticasone (FLONASE) 50 MCG/ACT nasal spray, Place 1-2 sprays into both nostrils daily as needed for allergies or rhinitis., Disp: , Rfl:  .  mesalamine (LIALDA) 1.2 g EC tablet, TAKE 2 TABLETS BY MOUTH  DAILY WITH BREAKFAST, Disp: 180 tablet, Rfl: 3 .  metoprolol succinate (TOPROL-XL) 25 MG 24 hr tablet, Take 25 mg by mouth daily., Disp: , Rfl:  .  tamoxifen (NOLVADEX) 20 MG tablet, TAKE 1 TABLET BY MOUTH  DAILY, Disp: 90 tablet, Rfl: 3   Family History  Problem Relation Age of Onset  . Breast cancer Mother 46  . Kidney cancer Father        dx in his mid 67s  . Prostate cancer Maternal Uncle        dx in  mid 38s  . Stomach cancer Paternal Aunt        dx in mid 62s  . Diabetes Maternal Grandmother   . Heart disease Maternal Grandmother   . Prostate cancer Maternal Grandfather   . Heart disease Paternal Grandmother   . Heart attack Paternal Grandfather   . Prostate cancer Maternal Uncle        dx in mid 38s  . Prostate cancer Maternal Uncle        dx in mid 91s  . Colon cancer Maternal Uncle        dx in mid 26s  . Colon cancer Cousin        paternal first cousin dx in his mid 32s  . Colon cancer Cousin        paternal first cousin dx in his mid 97s     Social History   Tobacco  Use  . Smoking status: Never Smoker  . Smokeless tobacco: Never Used  Vaping Use  . Vaping Use: Never used  Substance Use Topics  . Alcohol use: No  . Drug use: No    Allergies as of 12/30/2019 - Review Complete 12/30/2019  Allergen Reaction Noted  . Zithromax [azithromycin] Diarrhea 08/11/2015  . Clindamycin/lincomycin Rash 08/11/2015  . Erythromycin Rash 08/11/2015    Review of Systems:    All systems reviewed and negative except where noted in HPI.   Physical Exam:  BP 139/86 (BP Location: Left Arm, Patient Position: Sitting, Cuff Size: Normal)   Pulse 96   Temp 98.1 F (36.7 C) (Oral)   Ht 5' 2.5" (1.588 m)   Wt 155 lb 4 oz (70.4 kg)   BMI 27.94 kg/m  No LMP recorded. Patient has had a hysterectomy.  General:   Alert,  Well-developed, well-nourished, pleasant and cooperative in NAD Head:  Normocephalic and atraumatic. Eyes:  Sclera clear, no icterus.   Conjunctiva pink. Ears:  Normal auditory acuity. Nose:  No deformity, discharge, or lesions. Mouth:  No deformity or lesions,oropharynx pink & moist. Neck:  Supple; no masses or thyromegaly. Lungs:  Respirations even and unlabored.  Clear throughout to auscultation.   No wheezes, crackles, or rhonchi. No acute distress. Heart:  Regular rate and rhythm; no murmurs, clicks, rubs, or gallops. Abdomen:  Normal bowel sounds. Soft,  non-tender and non-distended without masses, hepatosplenomegaly or hernias noted.  No guarding or rebound tenderness.   Rectal: Not performed Msk:  Symmetrical without gross deformities. Good, equal movement & strength bilaterally. Pulses:  Normal pulses noted. Extremities:  No clubbing or edema.  No cyanosis. Neurologic:  Alert and oriented x3;  grossly normal neurologically. Skin:  Intact without significant lesions or rashes. No jaundice. Psych:  Alert and cooperative. Normal mood and affect.  Imaging Studies: Reviewed  Assessment and Plan:   KIELY COUSAR is a 59 y.o. 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 2.4 g daily, history of fatty liver, biopsy-proven  Indeterminate colitis Continue mesalamine 2.4 g daily Will obtain copy of all previous colonoscopy reports to determine the surveillance interval for the next colonoscopy She will probably need the next colonoscopy in 7622  Nonalcoholic fatty liver disease Reiterated on healthy lifestyle, exercise and weight loss Also, discussed about progression of fatty liver to cirrhosis over time if left untreated Recheck LFTs every 3 months  BRCA2 positive mutation Follow-up with oncology Patient denies family history of pancreatic cancer, will hold off on pancreatic screening at this time as patient does not meet the criteria accepted for pancreatic cancer screening  Follow up in 3 months   [name], MD

## 2020-01-29 ENCOUNTER — Encounter: Payer: Self-pay | Admitting: Gastroenterology

## 2020-01-30 ENCOUNTER — Encounter: Payer: Self-pay | Admitting: *Deleted

## 2020-02-11 ENCOUNTER — Encounter: Payer: Self-pay | Admitting: Hematology and Oncology

## 2020-02-13 ENCOUNTER — Telehealth: Payer: Self-pay | Admitting: Hematology and Oncology

## 2020-02-13 NOTE — Telephone Encounter (Signed)
Pt called to confirm MRI approval on 2/7. Patient also requested to change appt from 2/10 to 2/17. Confirmed with patient.

## 2020-02-17 ENCOUNTER — Other Ambulatory Visit: Payer: Self-pay

## 2020-02-17 ENCOUNTER — Ambulatory Visit
Admission: RE | Admit: 2020-02-17 | Discharge: 2020-02-17 | Disposition: A | Discharge status: Home / Self Care | Payer: BLUE CROSS/BLUE SHIELD | Source: Ambulatory Visit | Attending: Hematology and Oncology | Admitting: Hematology and Oncology

## 2020-02-17 DIAGNOSIS — Z170 Estrogen receptor positive status [ER+]: Secondary | ICD-10-CM | POA: Insufficient documentation

## 2020-02-17 DIAGNOSIS — Z1501 Genetic susceptibility to malignant neoplasm of breast: Secondary | ICD-10-CM | POA: Insufficient documentation

## 2020-02-17 DIAGNOSIS — C50911 Malignant neoplasm of unspecified site of right female breast: Secondary | ICD-10-CM

## 2020-02-17 DIAGNOSIS — Z1509 Genetic susceptibility to other malignant neoplasm: Secondary | ICD-10-CM | POA: Insufficient documentation

## 2020-02-17 MED ORDER — GADOBUTROL 1 MMOL/ML IV SOLN
7.0000 mL | Freq: Once | INTRAVENOUS | Status: AC | PRN
  Administered 2020-02-17: 7 mL via INTRAVENOUS

## 2020-02-20 ENCOUNTER — Inpatient Hospital Stay: Payer: BLUE CROSS/BLUE SHIELD | Admitting: Hematology and Oncology

## 2020-02-26 ENCOUNTER — Encounter: Payer: Self-pay | Admitting: Hematology and Oncology

## 2020-02-26 NOTE — Progress Notes (Signed)
Patient called/ pre- screened for virtual appoinment tomorrow with oncologist. Concerns of recent UTI and wants to inform MD of MOTTS nose surgery

## 2020-02-26 NOTE — Progress Notes (Signed)
[facility]  [address] [place], [place] [address] Phone: [phone]  Fax: [phone]   Clinic Day:  02/27/2020  Referring physician: Kirk Ruths, MD  Chief Complaint: Kelly Munoz is a 60 y.o. female with stage IIB Her2/neu + right breast cancer and a BRCA2 mutation who is seen for 6 month assessment.  HPI: The patient was last seen in the medical oncology clinic on 08/19/2019. At that time, she denied any breast concerns.  Exam revealed fibrocystic changes no discrete mass. Previous labs had noted lymphocytosis.  [facility] labs showed a hematocrit of 40.2, hemoglobin 13.4, platelets 214,000, WBC 8,600. Calcium was 10.3. AST was 52 and ALT 60. CA125 was 13.4. CA27.29 was 20.8. Flow cytometry showed no diagnostic immunophenotypic abnormality. There was absolute lymphocytosis. They were unable to evaluate B-cell clonality due to nonspecific light chain binding. She continued tamoxifen and vitamin D.  Liver biopsy on 11/05/2019 for elevated liver enzymes showed benign hepatic parenchyma with moderate macrovesicular steatosis, mild portal inflammation with focal lobular activity, and focal ballooning degeneration. Negative for malignancy.  She was seen by Dr Allen Norris in follow-up on 11/20/2019.  Liver biopsy confirmed fatty liver.  Diet and exercise were discussed.  She was to be scheduled for follow-up colonoscopy for her Crohn's disease.  She has an appointment with Dr Marius Ditch on 03/24/2020.  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 Lacinda Axon.  Bilateral breast MRI with and without contrast on 02/17/2020 revealed no MRI evidence of malignancy in either breast. There were stable right breast postsurgical changes.  [facility] labs on 02/10/2020 revealed a hematocrit of 43.8, hemoglobin 14.1, platelets 232,000, WBC 8,800. Creatinine was 0.54. CA125 was 14.6. CA27.29 was 14.9.  During the interim, she  has been "good." Her night sweats are stable and do not occur every night. Her tremors are under good control with medication.  Per patient, Dr. Marius Ditch believes that the patient's Crohn's disease may have been misdiagnosed.  She is having blood work done on 03/11/2020 at her PCPs office (Dr. Ouida Sills).   Past Medical History:  Diagnosis Date  . Breast cancer ([place])   . Breast cancer, right ([place]) 06/08/2011  . Family history of breast cancer   . Family history of colon cancer   . Family history of prostate cancer   . History of IBS   . History of kidney stones   . Hypertension   . 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([place])   . Personal history of chemotherapy 2013   RIGHT lumpectomy  . Personal history of radiation therapy 2013   RIGHT lumpectomy    Past Surgical History:  Procedure Laterality Date  . ABDOMINAL HYSTERECTOMY    . BREAST BIOPSY Right 2013   lumpectomy chem and rad  . BREAST LUMPECTOMY Right 2013  . COLONOSCOPY WITH PROPOFOL N/A 02/06/2017   Procedure: COLONOSCOPY WITH PROPOFOL;  Surgeon: Lollie Sails, MD;  [facility] ENDOSCOPY;  Service: Endoscopy;  Laterality: N/A;  . CYSTOSCOPY N/A 11/09/2015   Procedure: CYSTOSCOPY;  Surgeon: Honor Loh Ward, MD;  Location: [facility] ORS;  Service: Gynecology;  Laterality: N/A;  . parotid stone      Removal  . parotid stones Left   . REDUCTION MAMMAPLASTY Bilateral 02/2013  . TONSILLECTOMY      Family History  Problem Relation Age of Onset  . Breast cancer Mother 37  . Kidney cancer Father        dx  in his mid 63s  . Prostate cancer Maternal Uncle        dx in mid 43s  . Stomach cancer Paternal Aunt        dx in mid 70s  . Diabetes Maternal Grandmother   . Heart disease Maternal Grandmother   . Prostate cancer Maternal Grandfather   . Heart disease Paternal Grandmother   . Heart attack Paternal Grandfather   . Prostate cancer Maternal Uncle        dx in mid 73s  . Prostate cancer Maternal Uncle        dx  in mid 66s  . Colon cancer Maternal Uncle        dx in mid 71s  . Colon cancer Cousin        paternal first cousin dx in his mid 2s  . Colon cancer Cousin        paternal first cousin dx in his mid 25s    Social History:  reports that she has never smoked. She has never used smokeless tobacco. She reports that she does not drink alcohol and does not use drugs. She is from [place]. She moved to [place] at the end of 03/2015 to be with her family. Her parents live in [place]. She lives with her husband. 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 She has 2 daughters (48 year-old in graduate school, 59 year-old in [place]). Her husband had an MI and was hospitalized in [place].Her husband's name is Al. The patient is alone today.  Allergies:  Allergies  Allergen Reactions  . Zithromax [Azithromycin] Diarrhea  . Clindamycin/Lincomycin Rash  . Erythromycin Rash    Current Medications: Current Outpatient Medications  Medication Sig Dispense Refill  . carbidopa-levodopa (SINEMET IR) 25-250 MG tablet     . Cholecalciferol (VITAMIN D3) 5000 units CAPS Take 5,000 Units by mouth daily.    . cyanocobalamin 1000 MCG tablet Take by mouth.    . dicyclomine (BENTYL) 10 MG capsule Take 1 capsule (10 mg total) by mouth 3 (three) times daily as needed for spasms. 30 capsule 0  . FISH OIL-KRILL OIL PO Take 1 tablet by mouth daily.    . fluticasone (FLONASE) 50 MCG/ACT nasal spray Place 1-2 sprays into both nostrils daily as needed for allergies or rhinitis.    Marland Kitchen mesalamine (LIALDA) 1.2 g EC tablet TAKE 2 TABLETS BY MOUTH  DAILY WITH BREAKFAST 180 tablet 3  . metoprolol succinate (TOPROL-XL) 25 MG 24 hr tablet Take 25 mg by mouth daily.    . tamoxifen (NOLVADEX) 20 MG tablet TAKE 1 TABLET BY MOUTH  DAILY 90 tablet 3   No current facility-administered medications for this visit.    Review of Systems  Constitutional: Positive for  diaphoresis (night sweats, stable). Negative for chills, fever, malaise/fatigue and weight loss (up 1 lb).       Feels "good."  HENT: Negative.  Negative for congestion, ear discharge, ear pain, hearing loss, nosebleeds, sinus pain, sore throat and tinnitus.   Eyes: Negative.  Negative for blurred vision, double vision and pain.  Respiratory: Negative.  Negative for cough, hemoptysis, sputum production, shortness of breath and wheezing.   Cardiovascular: Negative.  Negative for chest pain, palpitations, claudication, leg swelling and PND.  Gastrointestinal: Negative.  Negative for abdominal pain, blood in stool, constipation, diarrhea, heartburn, melena, nausea and vomiting.       IBS  Genitourinary: Negative.  Negative for dysuria,  frequency, hematuria and urgency.       S/p TAH/BSO.  Musculoskeletal: Negative.  Negative for back pain, joint pain, myalgias and neck pain.  Skin: Negative.  Negative for itching and rash.  Neurological: Positive for tremors (under control). Negative for dizziness, tingling, sensory change, speech change, focal weakness, weakness and headaches.       Parkinsonism.  Endo/Heme/Allergies: Negative.  Does not bruise/bleed easily.  Psychiatric/Behavioral: Positive for memory loss. Negative for depression and substance abuse. The patient is not nervous/anxious and does not have insomnia.   All other systems reviewed and are negative.  Performance status (ECOG):  0-1  Vitals Blood pressure (!) 144/77, pulse 93, temperature 98.5 F (36.9 C), temperature source Tympanic, resp. rate 16, weight 153 lb 10.6 oz (69.7 kg), SpO2 100 %.   Physical Exam Vitals and nursing note reviewed.  Constitutional:      General: She is not in acute distress.    Appearance: She is well-developed. She is not diaphoretic.  HENT:     Head: Normocephalic and atraumatic.     Comments: Shoulder length blond hair with graying.    Mouth/Throat:     Mouth: Mucous membranes are moist.      Pharynx: Oropharynx is clear. No oropharyngeal exudate.  Eyes:     General: No scleral icterus.    Extraocular Movements: Extraocular movements intact.     Conjunctiva/sclera: Conjunctivae normal.     Pupils: Pupils are equal, round, and reactive to light.     Comments: Blue eyes with contacts.  Neck:     Vascular: No JVD.  Cardiovascular:     Rate and Rhythm: Normal rate and regular rhythm.     Heart sounds: Normal heart sounds. No murmur heard.   Pulmonary:     Effort: Pulmonary effort is normal. No respiratory distress.     Breath sounds: Normal breath sounds. No wheezing or rales.  Chest:  Breasts:     Right: No swelling, bleeding, mass, skin change, tenderness or supraclavicular adenopathy.     Left: No swelling, bleeding, mass, skin change, tenderness or supraclavicular adenopathy.    Abdominal:     General: Bowel sounds are normal. There is no distension.     Palpations: Abdomen is soft. There is no hepatomegaly, splenomegaly or mass.     Tenderness: There is no abdominal tenderness. There is no guarding or rebound.  Musculoskeletal:        General: Normal range of motion.     Cervical back: Normal range of motion and neck supple.  Lymphadenopathy:     Head:     Right side of head: No preauricular, posterior auricular or occipital adenopathy.     Left side of head: No preauricular, posterior auricular or occipital adenopathy.     Cervical: No cervical adenopathy.     Upper Body:     Right upper body: No supraclavicular adenopathy.     Left upper body: No supraclavicular adenopathy.     Lower Body: No right inguinal adenopathy. No left inguinal adenopathy.  Skin:    General: Skin is warm and dry.     Coloration: Skin is not pale.     Findings: No erythema or rash.  Neurological:     Mental Status: She is alert and oriented to person, place, and time.  Psychiatric:        Behavior: Behavior normal.        Thought Content: Thought content normal.        Judgment:  Judgment normal.    No visits with results within 3 Day(s) from this visit.  Latest known visit with results is:  Hospital Outpatient Visit on 11/05/2019  Component Date Value Ref Range Status  . WBC 11/05/2019 8.3  4.0 - 10.5 K/uL Final  . RBC 11/05/2019 4.54  3.87 - 5.11 MIL/uL Final  . Hemoglobin 11/05/2019 13.6  12.0 - 15.0 g/dL Final  . HCT 11/05/2019 41.0  36.0 - 46.0 % Final  . MCV 11/05/2019 90.3  80.0 - 100.0 fL Final  . MCH 11/05/2019 30.0  26.0 - 34.0 pg Final  . MCHC 11/05/2019 33.2  30.0 - 36.0 g/dL Final  . RDW 11/05/2019 12.8  11.5 - 15.5 % Final  . Platelets 11/05/2019 226  150 - 400 K/uL Final  . nRBC 11/05/2019 0.0  0.0 - 0.2 % Final   Performed at [facility], [address], [place], [place] [address]  . Prothrombin Time 11/05/2019 13.3  11.4 - 15.2 seconds Final  . INR 11/05/2019 1.1  0.8 - 1.2 Final   Comment: (NOTE) INR goal varies based on device and disease states. Performed at [facility], [address], [place], [place] [address]   . SURGICAL PATHOLOGY 11/05/2019    Final-Edited                   Value:SURGICAL PATHOLOGY CASE: [id number] PATIENT: Kelly Munoz Surgical Pathology Report   Specimen Submitted: A. Liver, right; biopsy  Clinical History: Medical liver biopsy  DIAGNOSIS: A. LIVER, RIGHT LOBE; ULTRASOUND-GUIDED CORE NEEDLE BIOPSY: - BENIGN HEPATIC PARENCHYMA WITH MODERATE MACROVESICULAR STEATOSIS, MILD PORTAL INFLAMMATION WITH FOCAL LOBULAR ACTIVITY, AND FOCAL BALLOONING DEGENERATION. - NEGATIVE FOR MALIGNANCY.  Comment: Trichrome special stains show only minimal periportal fibrosis, with focal pericellular/canalicular fibrosis.  There is no evidence of bridging fibrosis/cirrhosis.  PAS special stains with diastase are negative for abnormal intracytoplasmic inclusions.  Reticulin stains highlights unremarkable hepatic plate architecture.  Prussian blue iron stains are negative for increased hepatocyte  iron uptake.  Sections display hepatic parenchyma with moderate macrovesicular steatosis (approximately 30%), with mild periportal infl                         ammation, lobular activity, and ballooning degeneration.  Review of clinical laboratory studies shows mild elevations of AST and ALT, with normal values for alkaline phosphatase, bilirubin, GGT, ceruloplasmin, and alpha-1 antitrypsin.  Serologic studies are negative for antinuclear, antimitochondrial, and significant anti-smooth muscle antibodies. Calculation of an NAFLD activity score results in a total score of 3 (1 for steatosis, 1 for lobular inflammation, and 1 for hepatocytes ballooning) indicating results borderline for nonalcoholic steatohepatitis.  Clinical correlation is recommended.  GROSS DESCRIPTION: A. Labeled: Right liver biopsy Received: Formalin Number of needle core biopsy(s): 4 Length: Range from 1.4 to 1.9 cm Diameter: 0.1 cm Description: Received are cores of orange-tan soft tissue. Ink: None Entirely submitted in cassettes 1-4 with 1 core per cassette.  Final Diagnosis performed by Allena Napoleon, MD.   Electronically signed 11/07/2019 3:58:                         48PM The electronic signature indicates that the named Attending Pathologist has evaluated the specimen Technical component performed at [facility], [address], [place], [place] [address] Lab: [phone] Dir: Rush Farmer, MD, MMM  Professional component performed at [facility], [facility], [place], [place], [place] [address] Lab: [phone]  Dir: Dellia Nims. Rubinas, MD     Assessment:  Kelly Munoz is a 60 y.o. female with stage IIB Her2/neu + right breast cancers/p right partial mastectomy and sentinel lymph node biopsy on 06/08/2011. 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 There was solid, cribriform and comedo DCIS with necrosis. One of 5 lymph nodes were positive. There was a  macro-metastasis in one intramammary lymph node. Tumor was ER positive (88.8%), PR positive (90.93%), and Her2/neu 3+. Ki-67 was 48%. Pathologic stage was T2N1aM0.   My Risk Genetic testing on 07/13/2015 revealed a BRCA2 mutation(c.5946del). 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  She received 6 cycles of TCHchemotherapy beginning 07/11/2011. 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 She completed 1 year of adjuvant Herceptin. At one point, her EF decreased (no records available). EF returned to normal per patient report (last echo was in 2014).  She received radiation(completed before the end of 2013). She begantamoxifenin 01/2012.   CA27.29has been followed: 13.2 on 03/04/2015, 21.2 on 08/14/2015, 19.4 on 02/04/2016, 17.2 on 08/01/2016, 16.3 on 01/25/2017, 16.1 on 01/29/2018, 16.6 on 08/02/2018, 16.8 on 02/04/2019, 20.8 on 08/05/2019, and 14.9 on 02/10/2020.  Bilateral breast MRIon 01/30/2017 revealed no evidence of malignancy. Bilateral mammogramon 08/03/2016 revealed No evidence of malignancy. Bilateral diagnostic mammogramon 08/08/2017 revealed prior right lumpectomy with no findings of malignancy in either breast.There were no mammographic or sonographic abnormalities to account for left nipple sensitivity. Bilateral breast MRI on 02/08/2018 revealed no evidence of malignancy within either breast. Postsurgical changeswere presentwithin the RIGHT breast.  Bilateral screening mammogram on 08/10/2018 revealed no evidence of malignancy.  Bilateral breast MRI on 02/14/2019 revealed postsurgical changes in the right breast. There was no MRI evidence of malignancy seen in either breast.  Bilateral screening mammogram on 08/12/2019 revealed no evidence of malignancy.  Bilateral breast MRI on 02/17/2020 revealed no MRI evidence of malignancy in either breast. There were stable right breast postsurgical  changes.   She underwent total laparoscopic hysterectomy and bilateral oophorectomyon 11/09/2015. Pathology revealed no evidence of malignancy.  CA125 has been followed: 13.4 on 08/05/2019 and 14.6 on 02/10/2020.  She hasa history ofmildly increased liver function tests. She has fatty liver. She was on a statin drug. Hepatitis B and C serologies were negative on 09/14/2016. She denies any new medication or herbal product. Liver biopsy on 11/05/2019 revealed benign hepatic parenchyma with moderate macrovesicular steatosis, mild portal inflammation with focal lobular activity, and focal ballooning degeneration. There was no evidence of malignancy.  Bone densityon 03/23/2016 revealed osteopeniawith a T-score of -1.4 in the right femoral neck.  Bone density on 03/27/2018 revealed osteopenia with a T-score of -1.9 at the femur neck right.   She has a history of lymphocytosis.  Flow cytometry on 08/05/2019 revealed no diagnostic immunophenotypic abnormality. There was absolute lymphocytosis. B-cell clonality was unable to be performed secondary to nonspecific light chain binding. Consider B cell gene rearrangement studies.  She was diagnosed with early stage Parkinson's disease.  Tremorshave improved on Sinemet. Paraneoplastic antibody panel was negative on 08/11/2017. She has been followed by Dr.Potter, neurologist.  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  She received the [place] COVID-19 vaccine on 11/05/2019.  Symptomatically,  She feels "good." Her night sweats are stable and do not occur every night.  She denies any breast concerns.  Exam is stable.  Plan: 1.   Review [facility]  labs from 02/10/2020 2.Stage IIB Her2/neu + right breast cancer Clinically, she is doing well.             She remains on tamoxifen (began 01/2012).    Declines switch to an aromatase inhibitor.               Screening bilateral  mammogram on 08/12/2019 revealed no evidence of malignancy.  Bilateral breast MRI on 02/17/2020 revealed no  evidence of malignancy in either breast. Continue to alternate mammogram and breast MRIs  CA27.29 was 14.9 (normla) on 02/10/2020.             Continue surveillance. 3.BRCA2 mutation Continue ongoing close observation.  She continues tamoxifen.  CA125 was 14.6 on 02/10/2020.Marland Kitchen 4.Early stage Parkinson's disease She has tremors.  She is followed by neurology Paraneoplastic panel was negative. 5.Osteopenia Bone density on 03/26/2018 revealed osteopenia with a T score -1.9              No interval bone density-reschedule.    She remains on vitamin D. Oral calcium causes GI irritation.             Continue to recommend foods rich in calcium. 6.   Mild lymphocytosis  Clinically, she denies any fevers, sweats or weight loss  Exam reveals no adenopathy or hepatosplenomegaly.  Absolute lymphocyte count from last CBC was 3300.  Low cytometry on 08/05/2019 revealed no abnormal phenotype. 7.   Elevated LFTs  Review note from Dr Allen Norris ion 11/20/2019.    Liver biopsy confirmed fatty liver.    Diet and exercise were discussed. 8.   RN: Ensure she has CBC with diff and CMP with Dr Ouida Sills follow-up next month. 9.   RN:  [facility] slip:  CBC with diff, CMP, CA27.29, CA125. 10.   Bone density 03/26/2020. 11.   Bilateral mammogram on 08/11/2020. 12.   RTC in 6 months for MD assess including breast exam, review of [facility] labs, and review of imaging.  I discussed the assessment and treatment plan with the patient.  The patient was provided an opportunity to ask questions and all were answered.  The patient agreed with the plan and demonstrated an understanding of the instructions.  The patient was advised to call back if the symptoms worsen or if the condition fails to improve as anticipated.  I provided 18 minutes of  face-to-face time during this this encounter and > 50% was spent counseling as documented under my assessment and plan.  An additional 10 minutes were spent reviewing her chart (Epic and Care Everywhere) including notes, labs, and imaging studies.    Lequita Asal, MD, PhD    02/27/2020, 11:20 AM  I, Evert Kohl, am acting as a Education administrator for Lequita Asal, MD.  I, [place] Mike Gip, MD, have reviewed the above documentation for accuracy and completeness, and I agree with the above.

## 2020-02-27 ENCOUNTER — Telehealth: Payer: Self-pay

## 2020-02-27 ENCOUNTER — Inpatient Hospital Stay: Payer: BLUE CROSS/BLUE SHIELD | Attending: Hematology and Oncology | Admitting: Hematology and Oncology

## 2020-02-27 ENCOUNTER — Other Ambulatory Visit: Payer: Self-pay

## 2020-02-27 ENCOUNTER — Encounter: Payer: Self-pay | Admitting: Hematology and Oncology

## 2020-02-27 VITALS — BP 144/77 | HR 93 | Temp 98.5°F | Resp 16 | Wt 153.7 lb

## 2020-02-27 DIAGNOSIS — M85851 Other specified disorders of bone density and structure, right thigh: Secondary | ICD-10-CM | POA: Diagnosis not present

## 2020-02-27 DIAGNOSIS — Z79899 Other long term (current) drug therapy: Secondary | ICD-10-CM | POA: Insufficient documentation

## 2020-02-27 DIAGNOSIS — C50911 Malignant neoplasm of unspecified site of right female breast: Secondary | ICD-10-CM | POA: Insufficient documentation

## 2020-02-27 DIAGNOSIS — Z9011 Acquired absence of right breast and nipple: Secondary | ICD-10-CM | POA: Diagnosis not present

## 2020-02-27 DIAGNOSIS — Z85828 Personal history of other malignant neoplasm of skin: Secondary | ICD-10-CM | POA: Diagnosis not present

## 2020-02-27 DIAGNOSIS — Z9221 Personal history of antineoplastic chemotherapy: Secondary | ICD-10-CM | POA: Diagnosis not present

## 2020-02-27 DIAGNOSIS — K760 Fatty (change of) liver, not elsewhere classified: Secondary | ICD-10-CM | POA: Diagnosis not present

## 2020-02-27 DIAGNOSIS — Z148 Genetic carrier of other disease: Secondary | ICD-10-CM | POA: Diagnosis not present

## 2020-02-27 DIAGNOSIS — Z923 Personal history of irradiation: Secondary | ICD-10-CM | POA: Diagnosis not present

## 2020-02-27 DIAGNOSIS — Z170 Estrogen receptor positive status [ER+]: Secondary | ICD-10-CM | POA: Diagnosis not present

## 2020-02-27 DIAGNOSIS — Z1509 Genetic susceptibility to other malignant neoplasm: Secondary | ICD-10-CM

## 2020-02-27 DIAGNOSIS — R7989 Other specified abnormal findings of blood chemistry: Secondary | ICD-10-CM

## 2020-02-27 DIAGNOSIS — K5090 Crohn's disease, unspecified, without complications: Secondary | ICD-10-CM | POA: Diagnosis not present

## 2020-02-27 DIAGNOSIS — D72820 Lymphocytosis (symptomatic): Secondary | ICD-10-CM | POA: Diagnosis not present

## 2020-02-27 DIAGNOSIS — I10 Essential (primary) hypertension: Secondary | ICD-10-CM | POA: Diagnosis not present

## 2020-02-27 DIAGNOSIS — G20 Parkinson's disease: Secondary | ICD-10-CM | POA: Diagnosis not present

## 2020-02-27 DIAGNOSIS — Z1501 Genetic susceptibility to malignant neoplasm of breast: Secondary | ICD-10-CM

## 2020-02-27 DIAGNOSIS — Z79810 Long term (current) use of selective estrogen receptor modulators (SERMs): Secondary | ICD-10-CM | POA: Insufficient documentation

## 2020-02-28 ENCOUNTER — Other Ambulatory Visit: Payer: Self-pay | Admitting: Hematology and Oncology

## 2020-02-28 DIAGNOSIS — Z1231 Encounter for screening mammogram for malignant neoplasm of breast: Secondary | ICD-10-CM

## 2020-03-02 ENCOUNTER — Other Ambulatory Visit: Payer: Self-pay

## 2020-03-11 ENCOUNTER — Encounter: Payer: Self-pay | Admitting: Gastroenterology

## 2020-03-24 ENCOUNTER — Other Ambulatory Visit: Payer: Self-pay

## 2020-03-24 ENCOUNTER — Encounter: Payer: Self-pay | Admitting: Gastroenterology

## 2020-03-24 ENCOUNTER — Ambulatory Visit: Payer: BLUE CROSS/BLUE SHIELD | Admitting: Gastroenterology

## 2020-03-24 VITALS — BP 154/84 | HR 99 | Temp 97.8°F | Ht 62.5 in | Wt 153.1 lb

## 2020-03-24 DIAGNOSIS — K523 Indeterminate colitis: Secondary | ICD-10-CM | POA: Diagnosis not present

## 2020-03-24 DIAGNOSIS — K529 Noninfective gastroenteritis and colitis, unspecified: Secondary | ICD-10-CM

## 2020-03-24 MED ORDER — DICYCLOMINE HCL 10 MG PO CAPS
10.0000 mg | ORAL_CAPSULE | Freq: Three times a day (TID) | ORAL | 0 refills | Status: DC | PRN
Start: 2020-03-24 — End: 2021-08-19

## 2020-03-24 NOTE — Progress Notes (Signed)
[name], MD [address]  [place]  [place], [place] [address]  Main: [phone]  Fax: [phone]    Gastroenterology Consultation  Referring Provider:     Kirk Ruths, MD Primary Care Physician:  Kirk Ruths, MD Primary Gastroenterologist:  Dr. Lucilla Lame Reason for Consultation:     Indeterminate colitis        HPI:   EUGINA ROW is a 60 y.o. female referred by Dr. Ouida Sills, Ocie Cornfield, MD  for consultation & management of indeterminate colitis.  Patient has been treated for ulcerative colitis/Crohn's colitis since age of 60.  As a child, she was told that she had ileitis.  She has been maintained on mesalamine for several years.  Currently on Lialda 2.4 g daily.  She was under the care of a gastroenterologist in [place] for 22 years.  She was closely followed by [facility] gastroenterology for several years until recently when she switched her care to [facility] gastroenterology.  She was originally evaluated by Dr. Allen Norris for elevated transaminases, elevated ASMA, underwent liver biopsy which confirmed fatty liver only.  She is referred to me due to her history of indeterminate colitis.  Apparently, patient reports that she has been undergoing surveillance colonoscopies, every 5 years in the beginning followed by every 2 to 3 years.  Her last colonoscopy was in 2019 by Dr. Gustavo Lah, it was endoscopically normal, histology revealed focal mild active ileitis with no evidence of chronicity. Patient is a breast cancer survivor, on tamoxifen which is also resulted in weight gain Patient reports that she has been following anti-inflammatory diet in order to control her GI symptoms.  During flareups, depending on what she eats, she experiences abdominal bloating, nonbloody diarrhea Her CBC is normal, LFTs revealed mildly elevated AST and ALT  Follow-up visit 03/24/2020 Patient reports having 3-4 soft bowel movements associated with some abdominal  bloating and this is not new for her.  She continues to take mesalamine 2.4 g daily.  Her weight is stable.  She is planning on a cruise for 2 weeks end of this week.  Trying to follow healthy diet, her weight has been stable  NSAIDs: None  Antiplts/Anticoagulants/Anti thrombotics: None  Strong family history of colon cancer in maternal uncle in mid 71s, first cousin in mid 48s  GI Procedures:  Colonoscopy 02/06/2017 - Diverticulosis in the sigmoid colon, in the descending colon and in the transverse colon. - One less than 1 mm polyp in the rectum, removed with a cold biopsy forceps. Resected and retrieved. - The examined portion of the ileum was normal. - Biopsies were taken with a cold forceps from the cecum, ascending colon, transverse colon, descending colon, sigmoid colon and rectum for evaluation of microscopic colitis.  DIAGNOSIS:  A. COLON, GRANULAR MUCOSA AT ILEOCECAL VALVE; COLD BIOPSY:  - FOCAL MILD ACTIVE ILEITIS.  - NEGATIVE FOR DYSPLASIA AND MALIGNANCY.   B. RANDOM TERMINAL ILEUM; COLD BIOPSY:  - FOCAL MILD ACTIVE ILEITIS.  - NEGATIVE FOR DYSPLASIA AND MALIGNANCY.   C. COLON, RANDOM CECUM; COLD BIOPSY:  - NO PATHOLOGIC CHANGE.   D. COLON, RANDOM ASCENDING; COLD BIOPSY:  - NO PATHOLOGIC CHANGE.   E. RANDOM TRANSVERSE COLON; COLD BIOPSY:  - NO PATHOLOGIC CHANGE.   F. RANDOM DESCENDING COLON; COLD BIOPSY:  - NO PATHOLOGIC CHANGE.   G. RANDOM SIGMOID COLON; COLD BIOPSY:  - MILD CRYPT IRREGULARITIES.  - NEGATIVE FOR DYSPLASIA AND MALIGNANCY.   H. RANDOM RECTUM; COLD BIOPSY:  -  MILD CRYPT IRREGULARITIES.  - NEGATIVE FOR DYSPLASIA AND MALIGNANCY.   I. RECTUM POLYP; COLD BIOPSY:  - POLYPOID COLONIC MUCOSA NEGATIVE FOR DYSPLASIA AND MALIGNANCY.   Liver biopsy  DIAGNOSIS:  A. LIVER, RIGHT LOBE; ULTRASOUND-GUIDED CORE NEEDLE BIOPSY:  - BENIGN HEPATIC PARENCHYMA WITH MODERATE MACROVESICULAR STEATOSIS, MILD  PORTAL INFLAMMATION WITH FOCAL LOBULAR ACTIVITY,  AND FOCAL BALLOONING  DEGENERATION.  - NEGATIVE FOR MALIGNANCY.   Past Medical History:  Diagnosis Date  . Breast cancer ([place])   . Breast cancer, right ([place]) 06/08/2011  . Family history of breast cancer   . Family history of colon cancer   . Family history of prostate cancer   . History of IBS   . History of kidney stones   . Hypertension   . 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([place])   . Personal history of chemotherapy 2013   RIGHT lumpectomy  . Personal history of radiation therapy 2013   RIGHT lumpectomy    Past Surgical History:  Procedure Laterality Date  . ABDOMINAL HYSTERECTOMY    . BREAST BIOPSY Right 2013   lumpectomy chem and rad  . BREAST LUMPECTOMY Right 2013  . COLONOSCOPY WITH PROPOFOL N/A 02/06/2017   Procedure: COLONOSCOPY WITH PROPOFOL;  Surgeon: Lollie Sails, MD;  Location: [facility] ENDOSCOPY;  Service: Endoscopy;  Laterality: N/A;  . CYSTOSCOPY N/A 11/09/2015   Procedure: CYSTOSCOPY;  Surgeon: Honor Loh Ward, MD;  Location: [facility] ORS;  Service: Gynecology;  Laterality: N/A;  . parotid stone      Removal  . parotid stones Left   . REDUCTION MAMMAPLASTY Bilateral 02/2013  . TONSILLECTOMY       Current Outpatient Medications:  .  carbidopa-levodopa (SINEMET IR) 25-250 MG tablet, , Disp: , Rfl:  .  Cholecalciferol (VITAMIN D3) 5000 units CAPS, Take 5,000 Units by mouth daily., Disp: , Rfl:  .  cyanocobalamin 1000 MCG tablet, Take by mouth., Disp: , Rfl:  .  FISH OIL-KRILL OIL PO, Take 1 tablet by mouth daily., Disp: , Rfl:  .  fluticasone (FLONASE) 50 MCG/ACT nasal spray, Place 1-2 sprays into both nostrils daily as needed for allergies or rhinitis., Disp: , Rfl:  .  mesalamine (LIALDA) 1.2 g EC tablet, TAKE 2 TABLETS BY MOUTH  DAILY WITH BREAKFAST, Disp: 180 tablet, Rfl: 3 .  metoprolol succinate (TOPROL-XL) 25 MG 24 hr tablet, Take 25 mg by mouth daily., Disp: , Rfl:  .  tamoxifen (NOLVADEX) 20 MG tablet, TAKE 1 TABLET BY MOUTH  DAILY, Disp: 90  tablet, Rfl: 3 .  dicyclomine (BENTYL) 10 MG capsule, Take 1 capsule (10 mg total) by mouth 3 (three) times daily as needed for spasms., Disp: 30 capsule, Rfl: 0   Family History  Problem Relation Age of Onset  . Breast cancer Mother 17  . Kidney cancer Father        dx in his mid 98s  . Prostate cancer Maternal Uncle        dx in mid 36s  . Stomach cancer Paternal Aunt        dx in mid 72s  . Diabetes Maternal Grandmother   . Heart disease Maternal Grandmother   . Prostate cancer Maternal Grandfather   . Heart disease Paternal Grandmother   . Heart attack Paternal Grandfather   . Prostate cancer Maternal Uncle        dx in mid 92s  . Prostate cancer Maternal Uncle        dx in mid 50s  . Colon  cancer Maternal Uncle        dx in mid 56s  . Colon cancer Cousin        paternal first cousin dx in his mid 80s  . Colon cancer Cousin        paternal first cousin dx in his mid 10s     Social History   Tobacco Use  . Smoking status: Never Smoker  . Smokeless tobacco: Never Used  Vaping Use  . Vaping Use: Never used  Substance Use Topics  . Alcohol use: No  . Drug use: No    Allergies as of 03/24/2020 - Review Complete 03/24/2020  Allergen Reaction Noted  . Zithromax [azithromycin] Diarrhea 08/11/2015  . Clindamycin/lincomycin Rash 08/11/2015  . Erythromycin Rash 08/11/2015    Review of Systems:    All systems reviewed and negative except where noted in HPI.   Physical Exam:  BP (!) 154/84 (BP Location: Left Arm, Patient Position: Sitting, Cuff Size: Normal)   Pulse 99   Temp 97.8 F (36.6 C) (Oral)   Ht 5' 2.5" (1.588 m)   Wt 153 lb 2 oz (69.5 kg)   BMI 27.56 kg/m  No LMP recorded. Patient has had a hysterectomy.  General:   Alert,  Well-developed, well-nourished, pleasant and cooperative in NAD Head:  Normocephalic and atraumatic. Eyes:  Sclera clear, no icterus.   Conjunctiva pink. Ears:  Normal auditory acuity. Nose:  No deformity, discharge, or  lesions. Mouth:  No deformity or lesions,oropharynx pink & moist. Neck:  Supple; no masses or thyromegaly. Lungs:  Respirations even and unlabored.  Clear throughout to auscultation.   No wheezes, crackles, or rhonchi. No acute distress. Heart:  Regular rate and rhythm; no murmurs, clicks, rubs, or gallops. Abdomen:  Normal bowel sounds. Soft, non-tender and moderately distended, tympanic to percussion without masses, hepatosplenomegaly or hernias noted.  No guarding or rebound tenderness.   Rectal: Not performed Msk:  Symmetrical without gross deformities. Good, equal movement & strength bilaterally. Pulses:  Normal pulses noted. Extremities:  No clubbing or edema.  No cyanosis. Neurologic:  Alert and oriented x3;  grossly normal neurologically. Skin:  Intact without significant lesions or rashes. No jaundice. Psych:  Alert and cooperative. Normal mood and affect.  Imaging Studies: Reviewed  Assessment and Plan:   ASHRITHA DESROSIERS is a 60 y.o. 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 Therefore, recommend surveillance colonoscopy every 5 years or sooner if she develops any flareup  Chronic diarrhea and abdominal bloating, nonbloody Recommend to check fecal calprotectin levels, GI profile PCR to rule out infection and pancreatic fecal elastase levels ?  Celiac disease  Nonalcoholic fatty liver disease Reiterated on healthy lifestyle, exercise and weight loss Also, discussed about progression of fatty liver to cirrhosis over time if left  untreated Recheck LFTs every 3 months, most recent LFTs are normal  BRCA2 positive mutation Follow-up with oncology Patient denies family history of pancreatic cancer, will hold off on pancreatic screening at this time as patient does not meet the criteria accepted for pancreatic cancer screening  Follow up in 6 months   [name], MD

## 2020-04-20 ENCOUNTER — Other Ambulatory Visit: Payer: Self-pay

## 2020-04-20 ENCOUNTER — Ambulatory Visit
Admission: RE | Admit: 2020-04-20 | Discharge: 2020-04-20 | Disposition: A | Discharge status: Home / Self Care | Payer: BLUE CROSS/BLUE SHIELD | Source: Ambulatory Visit | Attending: Hematology and Oncology | Admitting: Hematology and Oncology

## 2020-04-20 DIAGNOSIS — M85851 Other specified disorders of bone density and structure, right thigh: Secondary | ICD-10-CM | POA: Insufficient documentation

## 2020-04-23 ENCOUNTER — Telehealth: Payer: Self-pay

## 2020-04-23 ENCOUNTER — Other Ambulatory Visit: Payer: Self-pay

## 2020-04-23 MED ORDER — VANCOMYCIN HCL 125 MG PO CAPS: 125.0000 mg | ORAL_CAPSULE | Freq: Four times a day (QID) | ORAL | 0 refills | Status: AC

## 2020-04-23 NOTE — Telephone Encounter (Signed)
Patient verbalized understanding of results sent medication to the pharmacy

## 2020-04-23 NOTE — Telephone Encounter (Signed)
-----   Message from Lin Landsman, MD sent at 04/23/2020 10:24 AM EDT ----- C Diff positive Recommend oral vanc 155m four times daily for 10days  RV

## 2020-04-24 LAB — GI PROFILE, STOOL, PCR
Adenovirus F 40/41: NOT DETECTED
Astrovirus: NOT DETECTED
C difficile toxin A/B: DETECTED — AB
Campylobacter: NOT DETECTED
Cryptosporidium: NOT DETECTED
Cyclospora cayetanensis: NOT DETECTED
Entamoeba histolytica: NOT DETECTED
Enteroaggregative E coli: NOT DETECTED
Enteropathogenic E coli: NOT DETECTED
Enterotoxigenic E coli: NOT DETECTED
Giardia lamblia: NOT DETECTED
Norovirus GI/GII: DETECTED — AB
Plesiomonas shigelloides: NOT DETECTED
Rotavirus A: NOT DETECTED
Salmonella: NOT DETECTED
Sapovirus: NOT DETECTED
Shiga-toxin-producing E coli: NOT DETECTED
Shigella/Enteroinvasive E coli: NOT DETECTED
Vibrio cholerae: NOT DETECTED
Vibrio: NOT DETECTED
Yersinia enterocolitica: NOT DETECTED

## 2020-04-24 LAB — CALPROTECTIN, FECAL: Calprotectin, Fecal: <16 ug/g (ref 0–120)

## 2020-04-24 LAB — PANCREATIC ELASTASE, FECAL: Pancreatic Elastase, Fecal: >500 ug Elast./g (ref >200)

## 2020-06-30 ENCOUNTER — Ambulatory Visit: Payer: BLUE CROSS/BLUE SHIELD | Admitting: Gastroenterology

## 2020-07-07 ENCOUNTER — Ambulatory Visit: Payer: BLUE CROSS/BLUE SHIELD | Admitting: Gastroenterology

## 2020-07-15 ENCOUNTER — Other Ambulatory Visit: Payer: Self-pay

## 2020-07-15 ENCOUNTER — Encounter: Payer: Self-pay | Admitting: Gastroenterology

## 2020-07-15 ENCOUNTER — Ambulatory Visit: Payer: BLUE CROSS/BLUE SHIELD | Admitting: Gastroenterology

## 2020-07-15 VITALS — BP 142/88 | HR 103 | Temp 98.2°F | Ht 62.5 in | Wt 155.1 lb

## 2020-07-15 DIAGNOSIS — K523 Indeterminate colitis: Secondary | ICD-10-CM | POA: Diagnosis not present

## 2020-07-15 DIAGNOSIS — Z8619 Personal history of other infectious and parasitic diseases: Secondary | ICD-10-CM | POA: Diagnosis not present

## 2020-07-15 NOTE — Patient Instructions (Signed)
Lactose-Free Diet, Adult If you have lactose intolerance, you are not able to digest lactose. Lactose is a natural sugar found mainly in dairy milk and dairy products. A lactose-freediet can help you avoid foods and beverages that contain lactose. What are tips for following this plan? Reading food labels Do not consume foods, beverages, vitamins, minerals, or medicines containing lactose. Read ingredient lists carefully. Look for the words "lactose-free" on labels. Meal planning Use alternatives to dairy milk and foods made with milk products. These include the following: Lactose-free milk. Soy milk with added calcium and vitamin D. Almond milk, coconut milk, rice milk, or other nondairy milk alternatives with added calcium and vitamin D. Note that a lot of these are low in protein. Soy products, such as soy yogurt, soy cheese, soy ice cream, and soy-based sour cream. Other nut milk products, such as almond yogurt, almond cheese, cashew yogurt, cashew cheese, cashew ice cream, coconut yogurt, and coconut ice cream. Medicines, vitamins, and supplements Use lactase enzyme drops or tablets as directed by your health care provider. Make sure you get enough calcium and vitamin D in your diet. A lactose-free eating plan can be lacking in these important nutrients. Take calcium and vitamin D supplements as directed by your health care provider. Talk with your health care provider about supplements if you are not able to get enough calcium and vitamin D from food. What foods should I eat?  Fruits All fresh, canned, frozen, or dried fruits and fruit juices that are notprocessed with lactose. Vegetables All fresh, frozen, and canned vegetables without cheese, cream, or buttersauces. Grains Any that are not made with dairy milk or dairy products. Meats and other proteins Any meat, fish, poultry, and other protein sources that are not made with dairymilk or dairy products. Fats and oils Any that are  not made with dairy milk or dairy products. Sweets and desserts Any that are not made with dairy milk or dairy products. Seasonings and condiments Any that are not made with dairy milk or dairy products. Calcium Calcium is found in many foods that contain lactose and is important for bone health. The amount of calcium you need depends on your age: Adults younger than 50 years: 1,000 mg of calcium a day. Adults older than 50 years: 1,200 mg of calcium a day. If you are not getting enough calcium, you may get it from other sources, including: Orange juice that has been fortified with calcium. This means that calcium has been added to the product. There are 300-350 mg of calcium in 1 cup (237 mL) of calcium-fortified orange juice. Soy milk fortified with calcium. There are 300-400 mg of calcium in 1 cup (237 mL) of calcium-fortified soy milk. Rice or almond milk fortified with calcium. There are 300 mg of calcium in 1 cup (237 mL) of calcium-fortified rice or almond milk. Breakfast cereals fortified with calcium. There are 100-1,000 mg of calcium in calcium-fortified breakfast cereals. Spinach, cooked. There are 145 mg of calcium in  cup (90 g) of cooked spinach. Edamame, cooked. There are 130 mg of calcium in  cup (47 g) of cooked edamame. Collard greens, cooked. There are 125 mg of calcium in  cup (85 g) of cooked collard greens. Kale, frozen or cooked. There are 90 mg of calcium in  cup (59 g) of cooked or frozen kale. Almonds. There are 95 mg of calcium in  cup (35 g) of almonds. Broccoli, cooked. There are 60 mg of calcium in 1 cup (156  g) of cooked broccoli. The items listed above may not be a complete list of foods and beverages you can eat. Contact a dietitian for more options. What foods should I avoid? Lactose is found in dairy milk and dairy products, such as: Yogurt. Cheese. Butter. Margarine. Sour cream. Cream. Whipped toppings and creamers. Ice cream and other  dairy-based desserts. Lactose is also found in foods or products made with dairy milk or milk ingredients. To find out whether a food contains dairy milk or a milk ingredient, look at the ingredients list. Avoid foods with the statement "May contain milk" and foods that contain: Milk powder. Whey. Curd. Lactose. Lactoglobulin. The items listed above may not be a complete list of foods and beverages to avoid. Contact a dietitian for more information. Where to find more information [employer] of Diabetes and Digestive and Kidney Diseases: [email or web address] Summary If you are lactose intolerant, it means that you are not able to digest lactose, a natural sugar found in milk and milk products. Following a lactose-free diet can help you manage this condition. Calcium is important for bone health and is found in many foods that contain lactose. Talk with your health care provider about other sources of calcium. This information is not intended to replace advice given to you by your health care provider. Make sure you discuss any questions you have with your healthcare provider. Document Revised: 12/03/2019 Document Reviewed: 12/03/2019 Elsevier Patient Education  2022 [employer].

## 2020-07-15 NOTE — Progress Notes (Signed)
Kelly Darby, Munoz [address]  [place]  [place], [place] [address]  Main: [phone]  Fax: [phone]    Gastroenterology Consultation  Referring Provider:     Kirk Ruths, Munoz Primary Care Physician:  Kelly Ruths, Munoz Primary Gastroenterologist:  Kelly Munoz Reason for Consultation:     Indeterminate colitis        HPI:   Kelly Munoz is a 60 y.o. female referred by Kelly Munoz  for consultation & management of indeterminate colitis.  Patient has been treated for ulcerative colitis/Crohn's colitis since age of 36.  As a child, she was told that she had ileitis.  She has been maintained on mesalamine for several years.  Currently on Lialda 2.4 g daily.  She was under the care of a gastroenterologist in [place] for 22 years.  She was closely followed by [facility] for several years until recently when she switched her care to [facility] gastroenter[facility].  She was originally evaluated by Kelly Munoz for elevated transaminases, elevated ASMA, underwent liver biopsy which confirmed fatty liver only.  She is referred to me due to her history of indeterminate colitis.  Apparently, patient reports that she has been undergoing surveillance colonoscopies, every 5 years in the beginning followed by every 2 to 3 years.  Her last colonoscopy was in 2019 by Kelly Munoz, it was endoscopically normal, histology revealed focal mild active ileitis with no evidence of chronicity. Patient is a breast cancer survivor, on tamoxifen which is also resulted in weight gain Patient reports that she has been following anti-inflammatory diet in order to control her GI symptoms.  During flareups, depending on what she eats, she experiences abdominal bloating, nonbloody diarrhea Her CBC is normal, LFTs revealed mildly elevated AST and ALT  Follow-up visit 03/24/2020 Patient reports having 3-4 soft bowel movements associated with some abdominal  bloating and this is not new for her.  She continues to take mesalamine 2.4 g daily.  Her weight is stable.  She is planning on a cruise for 2 weeks end of this week.  Trying to follow healthy diet, her weight has been stable  Follow-up visit 07/15/2020 Patient is here for follow-up of indeterminate colitis.  She had stool studies which revealed C. difficile and treated with 10 days course of antibiotics.  Her fecal calprotectin levels, pancreatic fecal elastase test came back normal.  She reports more than 50% improvement in her GI symptoms.  Her main concern today is abdominal bloating.  She states she has been limiting dairy products.  Her weight has been stable  NSAIDs: None  Antiplts/Anticoagulants/Anti thrombotics: None  Strong family history of colon cancer in maternal uncle in mid 26s, first cousin in mid 70s  GI Procedures:  Colonoscopy 02/06/2017 - Diverticulosis in the sigmoid colon, in the descending colon and in the transverse colon. - One less than 1 mm polyp in the rectum, removed with a cold biopsy forceps. Resected and retrieved. - The examined portion of the ileum was normal. - Biopsies were taken with a cold forceps from the cecum, ascending colon, transverse colon, descending colon, sigmoid colon and rectum for evaluation of microscopic colitis.  DIAGNOSIS:  A.  COLON, GRANULAR MUCOSA AT ILEOCECAL VALVE; COLD BIOPSY:  - FOCAL MILD ACTIVE ILEITIS.  - NEGATIVE FOR DYSPLASIA AND MALIGNANCY.   B.  RANDOM TERMINAL ILEUM; COLD BIOPSY:  - FOCAL MILD ACTIVE ILEITIS.  - NEGATIVE FOR DYSPLASIA AND MALIGNANCY.  C.  COLON, RANDOM CECUM; COLD BIOPSY:  - NO PATHOLOGIC CHANGE.   D.  COLON, RANDOM ASCENDING; COLD BIOPSY:  - NO PATHOLOGIC CHANGE.   E.  RANDOM TRANSVERSE COLON; COLD BIOPSY:  - NO PATHOLOGIC CHANGE.   F.  RANDOM DESCENDING COLON; COLD BIOPSY:  - NO PATHOLOGIC CHANGE.   G.  RANDOM SIGMOID COLON; COLD BIOPSY:  - MILD CRYPT IRREGULARITIES.  - NEGATIVE FOR  DYSPLASIA AND MALIGNANCY.   H.  RANDOM RECTUM; COLD BIOPSY:  - MILD CRYPT IRREGULARITIES.  - NEGATIVE FOR DYSPLASIA AND MALIGNANCY.   I.  RECTUM POLYP; COLD BIOPSY:  - POLYPOID COLONIC MUCOSA NEGATIVE FOR DYSPLASIA AND MALIGNANCY.   Liver biopsy  DIAGNOSIS:  A. LIVER, RIGHT LOBE; ULTRASOUND-GUIDED CORE NEEDLE BIOPSY:  - BENIGN HEPATIC PARENCHYMA WITH MODERATE MACROVESICULAR STEATOSIS, MILD  PORTAL INFLAMMATION WITH FOCAL LOBULAR ACTIVITY, AND FOCAL BALLOONING  DEGENERATION.  - NEGATIVE FOR MALIGNANCY.   Past Medical History:  Diagnosis Date   Anal fissure 07/07/2017   Breast cancer ([place])    Breast cancer, right ([place]) 06/08/2011   Family history of breast cancer    Family history of colon cancer    Family history of prostate cancer    History of IBS    History of kidney stones    Hypertension    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[facility])    Personal history of chemotherapy 2013   RIGHT lumpectomy   Personal history of radiation therapy 2013   RIGHT lumpectomy    Past Surgical History:  Procedure Laterality Date   ABDOMINAL HYSTERECTOMY     BREAST BIOPSY Right 2013   lumpectomy chem and rad   BREAST LUMPECTOMY Right 2013   COLONOSCOPY WITH PROPOFOL N/A 02/06/2017   Procedure: COLONOSCOPY WITH PROPOFOL;  Surgeon: Kelly Sails, Munoz;  Location: [facility] ENDOSCOPY;  Service: Endoscopy;  Laterality: N/A;   CYSTOSCOPY N/A 11/09/2015   Procedure: CYSTOSCOPY;  Surgeon: Kelly Loh Ward, Munoz;  Location: [facility] ORS;  Service: Gynecology;  Laterality: N/A;   parotid stone      Removal   parotid stones Left    REDUCTION MAMMAPLASTY Bilateral 02/2013   TONSILLECTOMY       Current Outpatient Medications:    carbidopa-levodopa (SINEMET IR) 25-250 MG tablet, , Disp: , Rfl:    Cholecalciferol (VITAMIN D3) 5000 units CAPS, Take 5,000 Units by mouth daily., Disp: , Rfl:    dicyclomine (BENTYL) 10 MG capsule, Take 1 capsule (10 mg total) by mouth 3 (three) times daily as needed for  spasms., Disp: 30 capsule, Rfl: 0   FISH OIL-KRILL OIL PO, Take 1 tablet by mouth daily., Disp: , Rfl:    mesalamine (LIALDA) 1.2 g EC tablet, TAKE 2 TABLETS BY MOUTH  DAILY WITH BREAKFAST, Disp: 180 tablet, Rfl: 3   metoprolol succinate (TOPROL-XL) 25 MG 24 hr tablet, Take 25 mg by mouth daily., Disp: , Rfl:    tamoxifen (NOLVADEX) 20 MG tablet, TAKE 1 TABLET BY MOUTH  DAILY, Disp: 90 tablet, Rfl: 3   fluticasone (FLONASE) 50 MCG/ACT nasal spray, Place 1-2 sprays into both nostrils daily as needed for allergies or rhinitis. (Patient not taking: Reported on 07/15/2020), Disp: , Rfl:    Family History  Problem Relation Age of Onset   Breast cancer Mother 24   Kidney cancer Father        dx in his mid 9s   Prostate cancer Maternal Uncle        dx in mid 91s   Stomach cancer  Paternal Aunt        dx in mid 61s   Diabetes Maternal Grandmother    Heart disease Maternal Grandmother    Prostate cancer Maternal Grandfather    Heart disease Paternal Grandmother    Heart attack Paternal Grandfather    Prostate cancer Maternal Uncle        dx in mid 41s   Prostate cancer Maternal Uncle        dx in mid 42s   Colon cancer Maternal Uncle        dx in mid 50s   Colon cancer Cousin        paternal first cousin dx in his mid 91s   Colon cancer Cousin        paternal first cousin dx in his mid 41s     Social History   Tobacco Use   Smoking status: Never   Smokeless tobacco: Never  Vaping Use   Vaping Use: Never used  Substance Use Topics   Alcohol use: No   Drug use: No    Allergies as of 07/15/2020 - Review Complete 07/15/2020  Allergen Reaction Noted   Zithromax [azithromycin] Diarrhea 08/11/2015   Clindamycin/lincomycin Rash 08/11/2015   Erythromycin Rash 08/11/2015    Review of Systems:    All systems reviewed and negative except where noted in HPI.   Physical Exam:  BP (!) 142/88 (BP Location: Left Arm, Patient Position: Sitting, Cuff Size: Normal)   Pulse (!) 103   Temp  98.2 F (36.8 C) (Oral)   Ht 5' 2.5" (1.588 m)   Wt 155 lb 2 oz (70.4 kg)   BMI 27.92 kg/m  No LMP recorded. Patient has had a hysterectomy.  General:   Alert,  Well-developed, well-nourished, pleasant and cooperative in NAD Head:  Normocephalic and atraumatic. Eyes:  Sclera clear, no icterus.   Conjunctiva pink. Ears:  Normal auditory acuity. Nose:  No deformity, discharge, or lesions. Mouth:  No deformity or lesions,oropharynx pink & moist. Neck:  Supple; no masses or thyromegaly. Lungs:  Respirations even and unlabored.  Clear throughout to auscultation.   No wheezes, crackles, or rhonchi. No acute distress. Heart:  Regular rate and rhythm; no murmurs, clicks, rubs, or gallops. Abdomen:  Normal bowel sounds. Soft, non-tender and moderately distended, tympanic to percussion without masses, hepatosplenomegaly or hernias noted.  No guarding or rebound tenderness.   Rectal: Not performed Msk:  Symmetrical without gross deformities. Good, equal movement & strength bilaterally. Pulses:  Normal pulses noted. Extremities:  No clubbing or edema.  No cyanosis. Neurologic:  Alert and oriented x3;  grossly normal neurologically. Skin:  Intact without significant lesions or rashes. No jaundice. Psych:  Alert and cooperative. Normal mood and affect.  Imaging Studies: Reviewed  Assessment and Plan:   Kelly Munoz is a 60 y.o. 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 Therefore, recommend surveillance colonoscopy every 5 years or sooner if she  develops any flareup  Chronic diarrhea and abdominal bloating, nonbloody S/p treatment with 10 days course of oral vancomycin for positive C. difficile infection on 04/21/2020 Pancreatic fecal elastase levels, fecal calprotectin levels are normal. Check  celiac disease panel Maintain food diary Trial of strict lactose-free diet for 2 to 3 weeks If her diarrhea is persistent, repeat C. difficile to confirm eradication and possible sigmoidoscopy  Nonalcoholic fatty liver disease Reiterated on healthy lifestyle, exercise and weight loss Also, discussed about progression of fatty liver to cirrhosis over time if left untreated Recheck LFTs every 6 months, most recent LFTs are normal  BRCA2 positive mutation Follow-up with oncology Patient denies family history of pancreatic cancer, will hold off on pancreatic screening at this time as patient does not meet the criteria accepted for pancreatic cancer screening  Follow up in 3 months   Kelly Darby, Munoz

## 2020-07-21 ENCOUNTER — Encounter: Payer: Self-pay | Admitting: Gastroenterology

## 2020-07-21 LAB — CELIAC DISEASE PANEL
Endomysial IgA: NEGATIVE
IgA/Immunoglobulin A, Serum: 89 mg/dL (ref 87–352)
Transglutaminase IgA: <2 U/mL (ref 0–3)

## 2020-07-22 ENCOUNTER — Encounter: Payer: Self-pay | Admitting: Gastroenterology

## 2020-08-12 ENCOUNTER — Ambulatory Visit
Admission: RE | Admit: 2020-08-12 | Discharge: 2020-08-12 | Disposition: A | Discharge status: Home / Self Care | Payer: BLUE CROSS/BLUE SHIELD | Source: Ambulatory Visit | Attending: Hematology and Oncology | Admitting: Hematology and Oncology

## 2020-08-12 ENCOUNTER — Other Ambulatory Visit: Payer: Self-pay

## 2020-08-12 DIAGNOSIS — Z1231 Encounter for screening mammogram for malignant neoplasm of breast: Secondary | ICD-10-CM | POA: Diagnosis not present

## 2020-08-27 ENCOUNTER — Inpatient Hospital Stay: Payer: BLUE CROSS/BLUE SHIELD | Attending: Oncology | Admitting: Oncology

## 2020-08-27 ENCOUNTER — Encounter: Payer: Self-pay | Admitting: Oncology

## 2020-08-27 ENCOUNTER — Other Ambulatory Visit: Payer: Self-pay

## 2020-08-27 ENCOUNTER — Ambulatory Visit: Payer: BLUE CROSS/BLUE SHIELD | Admitting: Oncology

## 2020-08-27 VITALS — BP 140/87 | HR 101 | Temp 98.0°F | Resp 18 | Wt 154.0 lb

## 2020-08-27 DIAGNOSIS — Z1501 Genetic susceptibility to malignant neoplasm of breast: Secondary | ICD-10-CM

## 2020-08-27 DIAGNOSIS — Z923 Personal history of irradiation: Secondary | ICD-10-CM | POA: Diagnosis not present

## 2020-08-27 DIAGNOSIS — M8580 Other specified disorders of bone density and structure, unspecified site: Secondary | ICD-10-CM | POA: Diagnosis not present

## 2020-08-27 DIAGNOSIS — R7989 Other specified abnormal findings of blood chemistry: Secondary | ICD-10-CM

## 2020-08-27 DIAGNOSIS — D72820 Lymphocytosis (symptomatic): Secondary | ICD-10-CM | POA: Insufficient documentation

## 2020-08-27 DIAGNOSIS — I10 Essential (primary) hypertension: Secondary | ICD-10-CM | POA: Insufficient documentation

## 2020-08-27 DIAGNOSIS — G20 Parkinson's disease: Secondary | ICD-10-CM | POA: Diagnosis not present

## 2020-08-27 DIAGNOSIS — C50911 Malignant neoplasm of unspecified site of right female breast: Secondary | ICD-10-CM | POA: Diagnosis not present

## 2020-08-27 DIAGNOSIS — Z170 Estrogen receptor positive status [ER+]: Secondary | ICD-10-CM | POA: Diagnosis not present

## 2020-08-27 DIAGNOSIS — Z148 Genetic carrier of other disease: Secondary | ICD-10-CM | POA: Diagnosis not present

## 2020-08-27 DIAGNOSIS — Z79810 Long term (current) use of selective estrogen receptor modulators (SERMs): Secondary | ICD-10-CM | POA: Diagnosis not present

## 2020-08-27 DIAGNOSIS — Z803 Family history of malignant neoplasm of breast: Secondary | ICD-10-CM | POA: Insufficient documentation

## 2020-08-27 DIAGNOSIS — Z85828 Personal history of other malignant neoplasm of skin: Secondary | ICD-10-CM | POA: Insufficient documentation

## 2020-08-27 DIAGNOSIS — Z9011 Acquired absence of right breast and nipple: Secondary | ICD-10-CM | POA: Diagnosis not present

## 2020-08-27 DIAGNOSIS — Z79899 Other long term (current) drug therapy: Secondary | ICD-10-CM | POA: Diagnosis not present

## 2020-08-27 DIAGNOSIS — Z800 Family history of malignant neoplasm of digestive organs: Secondary | ICD-10-CM | POA: Diagnosis not present

## 2020-08-27 DIAGNOSIS — Z8042 Family history of malignant neoplasm of prostate: Secondary | ICD-10-CM | POA: Insufficient documentation

## 2020-08-27 DIAGNOSIS — Z9221 Personal history of antineoplastic chemotherapy: Secondary | ICD-10-CM | POA: Insufficient documentation

## 2020-08-27 DIAGNOSIS — Z1509 Genetic susceptibility to other malignant neoplasm: Secondary | ICD-10-CM

## 2020-08-27 NOTE — Progress Notes (Signed)
Pt here for follow up. No new concerns voiced.   

## 2020-08-28 NOTE — Progress Notes (Signed)
[facility]  [address] [place], [place] [address] Phone: [phone]  Fax: [phone]   Clinic Day:  08/28/2020  Referring physician: Kirk Ruths, MD  Chief Complaint: Kelly Munoz is a 60 y.o. female with stage IIB Her2/neu + right breast cancer and a BRCA2 mutation who is seen for 6 month assessment.   PERTINENT ONCOLOGY HISTORY Patient previously followed up by Dr.Corcoran, patient switched care to me on 08/27/20 Extensive medical record review was performed by me  06/08/2011. 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  There was solid, cribriform and comedo DCIS with necrosis.  One of 5 lymph nodes were positive.  There was a macro-metastasis in one intramammary lymph node.  Tumor was ER positive (88.8%), PR positive (90.93%), and Her2/neu 3+.  Ki-67 was 48%.   Pathologic stage was [id number].    Adjuvant chemotherapy  6 cycles of TCH chemotherapy beginning 07/11/2011.  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  She completed 1 year of adjuvant Herceptin.  per Dr.Corcoran's note  her EF decreased (no records available).  EF returned to normal per patient report (last echo was in 2014).   Adjuvant radiation (completed before the end of 2013).     01/2012 Tamoxifen   My Risk Genetic testing on 07/13/2015 revealed a BRCA2 mutation (c.5946del). 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   Patient has been on breast MRI alternate with mammogram every 6 months.   # she had TAH- BSO  # Patient follows up with gastroenterology, and had colonoscopy and EGD in 2019.  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 Lacinda Axon.  # She was diagnosed with early stage  Parkinson's disease.  Tremors have improved on Sinemet.  Paraneoplastic antibody panel was negative on 08/11/2017.  She has been followed by Dr. Melrose Nakayama, neurologist.  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  # She has a history of lymphocytosis.  Flow cytometry on 08/05/2019 revealed no diagnostic immunophenotypic abnormality. There was absolute lymphocytosis. B-cell clonality was unable to be performed secondary to nonspecific light chain binding. Consider B cell gene rearrangement studies.   INTERVAL HISTORY Kelly Munoz is a 60 y.o. female who has above history reviewed by me today presents for follow up visit for management of history of breast cancer.  Today she feels well. She had mammogram done recently.  She denies any new breast concerns today.  Takes tamoxifen and tolerates well.    Review of Systems  Constitutional:  Negative for appetite change, chills, fatigue and fever.  HENT:   Negative for hearing loss and voice change.   Eyes:  Negative for eye problems.  Respiratory:  Negative for chest tightness and cough.   Cardiovascular:  Negative for chest pain.  Gastrointestinal:  Negative for abdominal distention, abdominal pain and blood in stool.  Endocrine: Positive for hot flashes.  Genitourinary:  Negative for difficulty urinating and frequency.   Musculoskeletal:  Positive for arthralgias.  Skin:  Negative for itching and rash.  Neurological:  Negative for extremity weakness.  Hematological:  Negative for adenopathy.  Psychiatric/Behavioral:  Negative for confusion.    Past Medical History:  Diagnosis Date   Anal  fissure 07/07/2017   Breast cancer ([place])    Breast cancer, right ([place]) 06/08/2011   Family history of breast cancer    Family history of colon cancer    Family history of prostate cancer    History of IBS    History of kidney stones    Hypertension    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[facility])     Personal history of chemotherapy 2013   RIGHT lumpectomy   Personal history of radiation therapy 2013   RIGHT lumpectomy    Past Surgical History:  Procedure Laterality Date   ABDOMINAL HYSTERECTOMY     BREAST BIOPSY Right 2013   lumpectomy chem and rad   BREAST LUMPECTOMY Right 2013   COLONOSCOPY WITH PROPOFOL N/A 02/06/2017   Procedure: COLONOSCOPY WITH PROPOFOL;  Surgeon: Lollie Sails, MD;  Location: [facility] ENDOSCOPY;  Service: Endoscopy;  Laterality: N/A;   CYSTOSCOPY N/A 11/09/2015   Procedure: CYSTOSCOPY;  Surgeon: Honor Loh Ward, MD;  Location: [facility] ORS;  Service: Gynecology;  Laterality: N/A;   parotid stone      Removal   parotid stones Left    REDUCTION MAMMAPLASTY Bilateral 02/2013   TONSILLECTOMY      Family History  Problem Relation Age of Onset   Breast cancer Mother 27   Kidney cancer Father        dx in his mid 74s   Prostate cancer Maternal Uncle        dx in mid 53s   Stomach cancer Paternal Aunt        dx in mid 22s   Diabetes Maternal Grandmother    Heart disease Maternal Grandmother    Prostate cancer Maternal Grandfather    Heart disease Paternal Grandmother    Heart attack Paternal Grandfather    Prostate cancer Maternal Uncle        dx in mid 38s   Prostate cancer Maternal Uncle        dx in mid 87s   Colon cancer Maternal Uncle        dx in mid 77s   Colon cancer Cousin        paternal first cousin dx in his mid 30s   Colon cancer Cousin        paternal first cousin dx in his mid 53s    Social History:  reports that she has never smoked. She has never used smokeless tobacco. She reports that she does not drink alcohol and does not use drugs. She is from [place].  She moved to [place] at the end of 03/2015 to be with her family.  Her parents live in [place].  She lives with her husband.  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  She has 2 daughters. The patient is alone  today.  Allergies:  Allergies  Allergen Reactions   Zithromax [Azithromycin] Diarrhea   Clindamycin/Lincomycin Rash   Erythromycin Rash    Current Medications: Current Outpatient Medications  Medication Sig Dispense Refill   carbidopa-levodopa (SINEMET IR) 25-250 MG tablet      Cholecalciferol (VITAMIN D3) 5000 units CAPS Take 5,000 Units by mouth daily.     FISH OIL-KRILL OIL PO Take 1 tablet by mouth daily.     mesalamine (LIALDA) 1.2 g EC tablet TAKE 2 TABLETS BY MOUTH  DAILY WITH BREAKFAST 180 tablet 3   metoprolol succinate (TOPROL-XL) 25 MG 24 hr tablet Take 25 mg by mouth daily.  tamoxifen (NOLVADEX) 20 MG tablet TAKE 1 TABLET BY MOUTH  DAILY 90 tablet 3   dicyclomine (BENTYL) 10 MG capsule Take 1 capsule (10 mg total) by mouth 3 (three) times daily as needed for spasms. (Patient not taking: Reported on 08/27/2020) 30 capsule 0   fluticasone (FLONASE) 50 MCG/ACT nasal spray Place 1-2 sprays into both nostrils daily as needed for allergies or rhinitis. (Patient not taking: No sig reported)     No current facility-administered medications for this visit.     Performance status (ECOG):  0-1  Vitals Blood pressure 140/87, pulse (!) 101, temperature 98 F (36.7 C), resp. rate 18, weight 153 lb 15.9 oz (69.8 kg).   Physical Exam Constitutional:      General: She is not in acute distress.    Appearance: She is not diaphoretic.  HENT:     Head: Normocephalic and atraumatic.     Nose: Nose normal.     Mouth/Throat:     Pharynx: No oropharyngeal exudate.  Eyes:     General: No scleral icterus.    Pupils: Pupils are equal, round, and reactive to light.  Cardiovascular:     Rate and Rhythm: Normal rate and regular rhythm.     Heart sounds: No murmur heard. Pulmonary:     Effort: Pulmonary effort is normal. No respiratory distress.     Breath sounds: No rales.  Chest:     Chest wall: No tenderness.  Abdominal:     General: There is no distension.     Palpations:  Abdomen is soft.     Tenderness: There is no abdominal tenderness.  Musculoskeletal:        General: Normal range of motion.     Cervical back: Normal range of motion and neck supple.  Skin:    General: Skin is warm and dry.     Findings: No erythema.  Neurological:     Mental Status: She is alert and oriented to person, place, and time.     Cranial Nerves: No cranial nerve deficit.     Motor: No abnormal muscle tone.     Coordination: Coordination normal.  Psychiatric:        Mood and Affect: Affect normal.    Breast exam was performed in seated and lying down position. Patient is status post right breast lumpectomy with a well-healed surgical scar.  Right breast with inferior scarring s/p surgery and radiation.  no discrete palpable masses..  Left breast medial scarring s/p breast reduction without masses, skin changes or nipple discharge.  No axillary lymphadenopathy.   RADIOGRAPHIC STUDIES: I have personally reviewed the radiological images as listed and agreed with the findings in the report. MM 3D SCREEN BREAST BILATERAL  Result Date: 08/14/2020 CLINICAL DATA:  Screening. EXAM: DIGITAL SCREENING BILATERAL MAMMOGRAM WITH TOMOSYNTHESIS AND CAD TECHNIQUE: Bilateral screening digital craniocaudal and mediolateral oblique mammograms were obtained. Bilateral screening digital breast tomosynthesis was performed. The images were evaluated with computer-aided detection. COMPARISON:  Previous exam(s). ACR Breast Density Category b: There are scattered areas of fibroglandular density. FINDINGS: There are no findings suspicious for malignancy. IMPRESSION: No mammographic evidence of malignancy. A result letter of this screening mammogram will be mailed directly to the patient. RECOMMENDATION: Screening mammogram in one year. (Code:SM-B-01Y) BI-RADS CATEGORY  1: Negative. Electronically Signed   By: Nolon Nations M.D.   On: 08/14/2020 10:40   Laboratory Studies.  Patient gets blood work done  at [facility].  Results are reviewed and scanned into EMR  Assessment and Plan:    1. Malignant neoplasm of right breast in female, estrogen receptor positive, unspecified site of breast ([place])   2. 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   3. Osteopenia, unspecified location   4. Elevated LFTs     # History of Stage IIB Triple positive breast cancer - 05/2011 S/p lumpectomy followed by adjvuant chemotherapy TCH x 6 followed by adjuvant RT, finished 1 year of transtuzumab.  On tamoxifen since 01/2012 Clinically she is doing well. Labs are reviewed and discussed with patient. Continue Tamoxifen, plan 10 years 08/11/2020 bilateral screening mammogram was reviewed and discussed with patient.  She is postmenopausal- declines switch to an aromatase inhibitor.   Labs from [place] is reviewed. Stable CA 27.29  # BRCA2 +  S/p TAH -BSO- 11/09/2015  She was recommended to have elective bilateral mastectomy and patient is undecided.  Stable normal CA 125 Check annual pancrease MRI.   # Osteopenia  04/20/2020 DEXA right femur T score -1.7 Continue calcium and vitamin D supplementation.   # Abnormal LFT, stable. She has history of fatty liver disease.  Liver biopsy on 11/05/2019 revealed benign hepatic parenchyma with moderate macrovesicular steatosis, mild portal inflammation with focal lobular activity, and focal ballooning degeneration. There was no evidence of malignancy.   #RTC: Bilateral MRI breast in 6 months, lab MD .  I discussed the assessment and treatment plan with the patient.  The patient was provided an opportunity to ask questions and all were answered.  The patient agreed with the plan and demonstrated an understanding of the instructions.  The patient was advised to call back if the symptoms worsen or if the condition fails to improve as anticipated.  We spent sufficient time to discuss many aspect of care, questions were answered to patient's satisfaction. A total of 40 minutes was spent on  this visit.  With 15 minutes spent reviewing image findings, pathology reports, 20 minutes counseling the patient on the diagnosis, treatment plan, surveillance, side effects of the treatment, management of symptoms.  Additional 5 minutes was spent on answering patient's questions.   Earlie Server, MD, PhD Hematology Oncology [place] at [facility] 08/28/2020

## 2020-09-10 ENCOUNTER — Ambulatory Visit
Admission: RE | Admit: 2020-09-10 | Discharge: 2020-09-10 | Disposition: A | Discharge status: Home / Self Care | Payer: BLUE CROSS/BLUE SHIELD | Source: Ambulatory Visit | Attending: Oncology | Admitting: Oncology

## 2020-09-10 ENCOUNTER — Other Ambulatory Visit: Payer: Self-pay | Admitting: Oncology

## 2020-09-10 DIAGNOSIS — Z170 Estrogen receptor positive status [ER+]: Secondary | ICD-10-CM | POA: Diagnosis present

## 2020-09-10 DIAGNOSIS — Z1501 Genetic susceptibility to malignant neoplasm of breast: Secondary | ICD-10-CM

## 2020-09-10 DIAGNOSIS — C50911 Malignant neoplasm of unspecified site of right female breast: Secondary | ICD-10-CM | POA: Insufficient documentation

## 2020-09-10 DIAGNOSIS — Z1509 Genetic susceptibility to other malignant neoplasm: Secondary | ICD-10-CM | POA: Insufficient documentation

## 2020-09-10 MED ORDER — GADOBUTROL 1 MMOL/ML IV SOLN
7.0000 mL | Freq: Once | INTRAVENOUS | Status: AC | PRN
  Administered 2020-09-10: 7 mL via INTRAVENOUS

## 2020-10-15 ENCOUNTER — Ambulatory Visit: Payer: BLUE CROSS/BLUE SHIELD | Admitting: Gastroenterology

## 2020-10-15 ENCOUNTER — Other Ambulatory Visit: Payer: Self-pay | Admitting: *Deleted

## 2020-10-15 ENCOUNTER — Encounter: Payer: Self-pay | Admitting: Gastroenterology

## 2020-10-15 ENCOUNTER — Other Ambulatory Visit: Payer: Self-pay

## 2020-10-15 VITALS — BP 135/91 | HR 94 | Temp 98.4°F | Ht 62.0 in | Wt 154.8 lb

## 2020-10-15 DIAGNOSIS — K523 Indeterminate colitis: Secondary | ICD-10-CM

## 2020-10-15 DIAGNOSIS — Z8619 Personal history of other infectious and parasitic diseases: Secondary | ICD-10-CM | POA: Diagnosis not present

## 2020-10-15 MED ORDER — TAMOXIFEN CITRATE 20 MG PO TABS
20.0000 mg | ORAL_TABLET | Freq: Every day | ORAL | 1 refills | Status: DC
Start: 2020-10-15 — End: 2021-04-05

## 2020-10-15 NOTE — Progress Notes (Signed)
[name], MD [address]  [place], [place] [address]  Main: [phone]  Fax: [phone]    Gastroenterology Consultation  Referring Provider:     Kirk Ruths, MD Primary Care Physician:  Kirk Ruths, MD Primary Gastroenterologist:  Dr. Lucilla Lame Reason for Consultation:     Indeterminate colitis        HPI:   Kelly Munoz is a 60 y.o. female referred by Dr. Ouida Sills, Ocie Cornfield, MD  for consultation & management of indeterminate colitis.  Patient has been treated for ulcerative colitis/Crohn's colitis since age of 47.  As a child, she was told that she had ileitis.  She has been maintained on mesalamine for several years.  Currently on Lialda 2.4 g daily.  She was under the care of a gastroenterologist in [place] for 22 years.  She was closely followed by [facility] gastroenterology for several years until recently when she switched her care to [facility] gastroenterology.  She was originally evaluated by Dr. Allen Norris for elevated transaminases, elevated ASMA, underwent liver biopsy which confirmed fatty liver only.  She is referred to me due to her history of indeterminate colitis.  Apparently, patient reports that she has been undergoing surveillance colonoscopies, every 5 years in the beginning followed by every 2 to 3 years.  Her last colonoscopy was in 2019 by Dr. Gustavo Lah, it was endoscopically normal, histology revealed focal mild active ileitis with no evidence of chronicity. Patient is a breast cancer survivor, on tamoxifen which is also resulted in weight gain Patient reports that she has been following anti-inflammatory diet in order to control her GI symptoms.  During flareups, depending on what she eats, she experiences abdominal bloating, nonbloody diarrhea Her CBC is normal, LFTs revealed mildly elevated AST and ALT  Follow-up visit 03/24/2020 Patient reports having 3-4 soft bowel movements associated with some abdominal  bloating and this is not new for her.  She continues to take mesalamine 2.4 g daily.  Her weight is stable.  She is planning on a cruise for 2 weeks end of this week.  Trying to follow healthy diet, her weight has been stable  Follow-up visit 07/15/2020 Patient is here for follow-up of indeterminate colitis.  She had stool studies which revealed C. difficile and treated with 10 days course of antibiotics.  Her fecal calprotectin levels, pancreatic fecal elastase test came back normal.  She reports more than 50% improvement in her GI symptoms.  Her main concern today is abdominal bloating.  She states she has been limiting dairy products.  Her weight has been stable  Follow-up visit 10/15/2020 Patient is here for follow-up of indeterminate colitis.  She reports ongoing abdominal bloating, and her stools are mushy and sometimes 2-3 times daily about few days a week.  She has tried strict lactose-free diet which did not make any difference.  Celiac disease panel was negative.  Weight has been stable.  She continues to take mesalamine 2.4 g daily  NSAIDs: None  Antiplts/Anticoagulants/Anti thrombotics: None  Strong family history of colon cancer in maternal uncle in mid 69s, first cousin in mid 66s  GI Procedures:  Colonoscopy 02/06/2017 - Diverticulosis in the sigmoid colon, in the descending colon and in the transverse colon. - One less than 1 mm polyp in the rectum, removed with a cold biopsy forceps. Resected and retrieved. - The examined portion of the ileum was normal. - Biopsies were taken with a cold forceps from the  cecum, ascending colon, transverse colon, descending colon, sigmoid colon and rectum for evaluation of microscopic colitis.  DIAGNOSIS:  A.  COLON, GRANULAR MUCOSA AT ILEOCECAL VALVE; COLD BIOPSY:  - FOCAL MILD ACTIVE ILEITIS.  - NEGATIVE FOR DYSPLASIA AND MALIGNANCY.   B.  RANDOM TERMINAL ILEUM; COLD BIOPSY:  - FOCAL MILD ACTIVE ILEITIS.  - NEGATIVE FOR DYSPLASIA AND  MALIGNANCY.   C.  COLON, RANDOM CECUM; COLD BIOPSY:  - NO PATHOLOGIC CHANGE.   D.  COLON, RANDOM ASCENDING; COLD BIOPSY:  - NO PATHOLOGIC CHANGE.   E.  RANDOM TRANSVERSE COLON; COLD BIOPSY:  - NO PATHOLOGIC CHANGE.   F.  RANDOM DESCENDING COLON; COLD BIOPSY:  - NO PATHOLOGIC CHANGE.   G.  RANDOM SIGMOID COLON; COLD BIOPSY:  - MILD CRYPT IRREGULARITIES.  - NEGATIVE FOR DYSPLASIA AND MALIGNANCY.   H.  RANDOM RECTUM; COLD BIOPSY:  - MILD CRYPT IRREGULARITIES.  - NEGATIVE FOR DYSPLASIA AND MALIGNANCY.   I.  RECTUM POLYP; COLD BIOPSY:  - POLYPOID COLONIC MUCOSA NEGATIVE FOR DYSPLASIA AND MALIGNANCY.   Liver biopsy  DIAGNOSIS:  A. LIVER, RIGHT LOBE; ULTRASOUND-GUIDED CORE NEEDLE BIOPSY:  - BENIGN HEPATIC PARENCHYMA WITH MODERATE MACROVESICULAR STEATOSIS, MILD  PORTAL INFLAMMATION WITH FOCAL LOBULAR ACTIVITY, AND FOCAL BALLOONING  DEGENERATION.  - NEGATIVE FOR MALIGNANCY.   Past Medical History:  Diagnosis Date   Anal fissure 07/07/2017   Breast cancer ([place])    Breast cancer, right ([place]) 06/08/2011   Family history of breast cancer    Family history of colon cancer    Family history of prostate cancer    History of IBS    History of kidney stones    Hypertension    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[facility])    Personal history of chemotherapy 2013   RIGHT lumpectomy   Personal history of radiation therapy 2013   RIGHT lumpectomy    Past Surgical History:  Procedure Laterality Date   ABDOMINAL HYSTERECTOMY     BREAST BIOPSY Right 2013   lumpectomy chem and rad   BREAST LUMPECTOMY Right 2013   COLONOSCOPY WITH PROPOFOL N/A 02/06/2017   Procedure: COLONOSCOPY WITH PROPOFOL;  Surgeon: Lollie Sails, MD;  Location: [facility] ENDOSCOPY;  Service: Endoscopy;  Laterality: N/A;   CYSTOSCOPY N/A 11/09/2015   Procedure: CYSTOSCOPY;  Surgeon: Honor Loh Ward, MD;  Location: [facility] ORS;  Service: Gynecology;  Laterality: N/A;   parotid stone      Removal   parotid stones Left     REDUCTION MAMMAPLASTY Bilateral 02/2013   TONSILLECTOMY       Current Outpatient Medications:    carbidopa-levodopa (SINEMET IR) 25-250 MG tablet, , Disp: , Rfl:    Cholecalciferol (VITAMIN D3) 5000 units CAPS, Take 5,000 Units by mouth daily., Disp: , Rfl:    dicyclomine (BENTYL) 10 MG capsule, Take 1 capsule (10 mg total) by mouth 3 (three) times daily as needed for spasms., Disp: 30 capsule, Rfl: 0   FISH OIL-KRILL OIL PO, Take 1 tablet by mouth daily., Disp: , Rfl:    fluticasone (FLONASE) 50 MCG/ACT nasal spray, Place 1-2 sprays into both nostrils daily as needed for allergies or rhinitis., Disp: , Rfl:    mesalamine (LIALDA) 1.2 g EC tablet, TAKE 2 TABLETS BY MOUTH  DAILY WITH BREAKFAST, Disp: 180 tablet, Rfl: 3   metoprolol succinate (TOPROL-XL) 25 MG 24 hr tablet, Take 25 mg by mouth daily., Disp: , Rfl:    tamoxifen (NOLVADEX) 20 MG tablet, Take 1 tablet (20 mg total)  by mouth daily., Disp: 90 tablet, Rfl: 1   Family History  Problem Relation Age of Onset   Breast cancer Mother 32   Kidney cancer Father        dx in his mid 101s   Prostate cancer Maternal Uncle        dx in mid 42s   Stomach cancer Paternal Aunt        dx in mid 33s   Diabetes Maternal Grandmother    Heart disease Maternal Grandmother    Prostate cancer Maternal Grandfather    Heart disease Paternal Grandmother    Heart attack Paternal Grandfather    Prostate cancer Maternal Uncle        dx in mid 55s   Prostate cancer Maternal Uncle        dx in mid 63s   Colon cancer Maternal Uncle        dx in mid 60s   Colon cancer Cousin        paternal first cousin dx in his mid 14s   Colon cancer Cousin        paternal first cousin dx in his mid 63s     Social History   Tobacco Use   Smoking status: Never   Smokeless tobacco: Never  Vaping Use   Vaping Use: Never used  Substance Use Topics   Alcohol use: No   Drug use: No    Allergies as of 10/15/2020 - Review Complete 10/15/2020  Allergen  Reaction Noted   Zithromax [azithromycin] Diarrhea 08/11/2015   Clindamycin/lincomycin Rash 08/11/2015   Erythromycin Rash 08/11/2015    Review of Systems:    All systems reviewed and negative except where noted in HPI.   Physical Exam:  BP (!) 135/91 (BP Location: Left Arm, Patient Position: Sitting, Cuff Size: Normal)   Pulse 94   Temp 98.4 F (36.9 C) (Oral)   Ht 5' 2"  (1.575 m)   Wt 154 lb 12.8 oz (70.2 kg)   BMI 28.31 kg/m  No LMP recorded. Patient has had a hysterectomy.  General:   Alert,  Well-developed, well-nourished, pleasant and cooperative in NAD Head:  Normocephalic and atraumatic. Eyes:  Sclera clear, no icterus.   Conjunctiva pink. Ears:  Normal auditory acuity. Nose:  No deformity, discharge, or lesions. Mouth:  No deformity or lesions,oropharynx pink & moist. Neck:  Supple; no masses or thyromegaly. Lungs:  Respirations even and unlabored.  Clear throughout to auscultation.   No wheezes, crackles, or rhonchi. No acute distress. Heart:  Regular rate and rhythm; no murmurs, clicks, rubs, or gallops. Abdomen:  Normal bowel sounds. Soft, non-tender and moderately distended, tympanic to percussion without masses, hepatosplenomegaly or hernias noted.  No guarding or rebound tenderness.   Rectal: Not performed Msk:  Symmetrical without gross deformities. Good, equal movement & strength bilaterally. Pulses:  Normal pulses noted. Extremities:  No clubbing or edema.  No cyanosis. Neurologic:  Alert and oriented x3;  grossly normal neurologically. Skin:  Intact without significant lesions or rashes. No jaundice. Psych:  Alert and cooperative. Normal mood and affect.  Imaging Studies: Reviewed  Assessment and Plan:   Kelly Munoz is a 60 y.o. female with history of stage IIb right breast cancer s/p right partial mastectomy in 05/2011 in remission, maintained on tamoxifen, history of hysterectomy and bilateral salpingo-oophorectomy history of indeterminate colitis  maintained on mesalamine 2.4 g daily, history of fatty liver, biopsy-proven  Indeterminate colitis: Currently in endoscopic and histologic remission Advised patient to stop  mesalamine because mesalamine derivative sometimes can lead to paradoxical diarrhea I have personally reviewed all the previous colonoscopy reports, there was no evidence of endoscopy inflammation or histologic inflammation. Therefore, recommend surveillance colonoscopy every 5 years or sooner if she develops any flareup  Chronic diarrhea and abdominal bloating, nonbloody S/p treatment with 10 days course of oral vancomycin for positive C. difficile infection on 04/21/2020 Pancreatic fecal elastase levels, fecal calprotectin levels are normal. celiac disease panel negative Trial of strict lactose-free diet for 2 to 3 weeks did not help with resolution of symptoms With ongoing loose stools and bloating, recommended to repeat C. difficile test If C. difficile comes back negative and diarrhea persists despite stopping mesalamine, will empirically treat with rifaximin for possible bacterial overgrowth  Nonalcoholic fatty liver disease Reiterated on healthy lifestyle, exercise and weight loss Also, discussed about progression of fatty liver to cirrhosis over time if left untreated Recheck LFTs every 6 months, most recent LFTs are normal  BRCA2 positive mutation Follow-up with oncology Patient denies family history of pancreatic cancer, will hold off on pancreatic screening at this time as patient does not meet the criteria accepted for pancreatic cancer screening  Follow up in 6 months   [name], MD

## 2020-11-09 ENCOUNTER — Other Ambulatory Visit: Payer: Self-pay | Admitting: Gastroenterology

## 2020-11-16 ENCOUNTER — Encounter: Payer: Self-pay | Admitting: Gastroenterology

## 2020-11-16 ENCOUNTER — Other Ambulatory Visit: Payer: Self-pay

## 2020-11-16 MED ORDER — MESALAMINE 1.2 G PO TBEC: 2.4000 g | DELAYED_RELEASE_TABLET | Freq: Every day | ORAL | 3 refills | Status: DC

## 2020-11-16 NOTE — Progress Notes (Signed)
Called [facility] about refill in [place] spoke with pharmacist she has prescription and it is being refilled on November the 22 is when prescription is due for refill

## 2020-12-25 ENCOUNTER — Other Ambulatory Visit: Payer: Self-pay | Admitting: Student

## 2020-12-25 DIAGNOSIS — Z136 Encounter for screening for cardiovascular disorders: Secondary | ICD-10-CM

## 2020-12-28 ENCOUNTER — Ambulatory Visit
Admission: RE | Admit: 2020-12-28 | Discharge: 2020-12-28 | Disposition: A | Discharge status: Home / Self Care | Payer: BLUE CROSS/BLUE SHIELD | Source: Ambulatory Visit | Attending: Student | Admitting: Student

## 2020-12-28 ENCOUNTER — Other Ambulatory Visit: Payer: Self-pay

## 2020-12-28 DIAGNOSIS — Z136 Encounter for screening for cardiovascular disorders: Secondary | ICD-10-CM | POA: Insufficient documentation

## 2021-02-08 ENCOUNTER — Encounter: Payer: Self-pay | Admitting: Oncology

## 2021-02-15 ENCOUNTER — Other Ambulatory Visit: Payer: Self-pay

## 2021-02-15 ENCOUNTER — Ambulatory Visit
Admission: RE | Admit: 2021-02-15 | Discharge: 2021-02-15 | Disposition: A | Discharge status: Home / Self Care | Payer: BLUE CROSS/BLUE SHIELD | Source: Ambulatory Visit | Attending: Oncology | Admitting: Oncology

## 2021-02-15 DIAGNOSIS — C50911 Malignant neoplasm of unspecified site of right female breast: Secondary | ICD-10-CM | POA: Diagnosis not present

## 2021-02-15 DIAGNOSIS — Z170 Estrogen receptor positive status [ER+]: Secondary | ICD-10-CM | POA: Insufficient documentation

## 2021-02-15 DIAGNOSIS — Z1501 Genetic susceptibility to malignant neoplasm of breast: Secondary | ICD-10-CM | POA: Diagnosis present

## 2021-02-15 DIAGNOSIS — Z1509 Genetic susceptibility to other malignant neoplasm: Secondary | ICD-10-CM | POA: Diagnosis present

## 2021-02-15 MED ORDER — GADOBUTROL 1 MMOL/ML IV SOLN
7.0000 mL | Freq: Once | INTRAVENOUS | Status: AC | PRN
  Administered 2021-02-15: 7 mL via INTRAVENOUS

## 2021-02-19 ENCOUNTER — Inpatient Hospital Stay: Payer: BLUE CROSS/BLUE SHIELD | Attending: Oncology | Admitting: Oncology

## 2021-02-19 ENCOUNTER — Other Ambulatory Visit: Payer: Self-pay

## 2021-02-19 ENCOUNTER — Encounter: Payer: Self-pay | Admitting: Oncology

## 2021-02-19 VITALS — BP 145/95 | HR 98 | Temp 98.9°F | Resp 16 | Ht 62.0 in | Wt 152.9 lb

## 2021-02-19 DIAGNOSIS — M8580 Other specified disorders of bone density and structure, unspecified site: Secondary | ICD-10-CM | POA: Diagnosis not present

## 2021-02-19 DIAGNOSIS — Z923 Personal history of irradiation: Secondary | ICD-10-CM | POA: Diagnosis not present

## 2021-02-19 DIAGNOSIS — Z148 Genetic carrier of other disease: Secondary | ICD-10-CM | POA: Diagnosis not present

## 2021-02-19 DIAGNOSIS — Z1502 Genetic susceptibility to malignant neoplasm of ovary: Secondary | ICD-10-CM | POA: Diagnosis not present

## 2021-02-19 DIAGNOSIS — Z1509 Genetic susceptibility to other malignant neoplasm: Secondary | ICD-10-CM | POA: Insufficient documentation

## 2021-02-19 DIAGNOSIS — Z85828 Personal history of other malignant neoplasm of skin: Secondary | ICD-10-CM | POA: Diagnosis not present

## 2021-02-19 DIAGNOSIS — Z170 Estrogen receptor positive status [ER+]: Secondary | ICD-10-CM | POA: Diagnosis not present

## 2021-02-19 DIAGNOSIS — D72820 Lymphocytosis (symptomatic): Secondary | ICD-10-CM | POA: Insufficient documentation

## 2021-02-19 DIAGNOSIS — Z79810 Long term (current) use of selective estrogen receptor modulators (SERMs): Secondary | ICD-10-CM | POA: Insufficient documentation

## 2021-02-19 DIAGNOSIS — R7989 Other specified abnormal findings of blood chemistry: Secondary | ICD-10-CM | POA: Diagnosis not present

## 2021-02-19 DIAGNOSIS — Z79899 Other long term (current) drug therapy: Secondary | ICD-10-CM | POA: Insufficient documentation

## 2021-02-19 DIAGNOSIS — G20 Parkinson's disease: Secondary | ICD-10-CM | POA: Diagnosis not present

## 2021-02-19 DIAGNOSIS — Z9221 Personal history of antineoplastic chemotherapy: Secondary | ICD-10-CM | POA: Diagnosis not present

## 2021-02-19 DIAGNOSIS — Z90722 Acquired absence of ovaries, bilateral: Secondary | ICD-10-CM | POA: Diagnosis not present

## 2021-02-19 DIAGNOSIS — C50911 Malignant neoplasm of unspecified site of right female breast: Secondary | ICD-10-CM | POA: Diagnosis not present

## 2021-02-19 DIAGNOSIS — Z90710 Acquired absence of both cervix and uterus: Secondary | ICD-10-CM | POA: Diagnosis not present

## 2021-02-19 DIAGNOSIS — Z1501 Genetic susceptibility to malignant neoplasm of breast: Secondary | ICD-10-CM | POA: Diagnosis not present

## 2021-02-19 DIAGNOSIS — Z9011 Acquired absence of right breast and nipple: Secondary | ICD-10-CM | POA: Insufficient documentation

## 2021-02-19 NOTE — Progress Notes (Signed)
Hematology/Oncology Progress note [phone] [phone]      Clinic Day:  02/19/2021  Referring physician: Kirk Ruths, MD  Chief Complaint: Kelly Munoz is a 61 y.o. female with stage IIB Her2/neu + right breast cancer and a BRCA2 mutation who is seen for 6 month assessment.   PERTINENT ONCOLOGY HISTORY Patient previously followed up by Dr.Corcoran, patient switched care to me on 08/27/20 Extensive medical record review was performed by me  06/08/2011. Stage IIB Her2/neu + right breast cancer s/p right partial mastectomy and sentinel lymph node biopsy.[facility] in [place] Pathology revealed a 3 cm grade III invasive ductal carcinoma.  There was solid, cribriform and comedo DCIS with necrosis.  One of 5 lymph nodes were positive.  There was a macro-metastasis in one intramammary lymph node.  Tumor was ER positive (88.8%), PR positive (90.93%), and Her2/neu 3+.  Ki-67 was 48%.   Pathologic stage was T2N1aM0.    Adjuvant chemotherapy  6 cycles of TCH chemotherapy beginning 07/11/2011.  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  She completed 1 year of adjuvant Herceptin.  per Dr.Corcoran's note  her EF decreased (no records available).  EF returned to normal per patient report (last echo was in 2014).   Adjuvant radiation (completed before the end of 2013).     01/2012 Tamoxifen   My Risk Genetic testing on 07/13/2015 revealed a BRCA2 mutation (c.[id number]). 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   Patient has been on breast MRI alternate with mammogram every 6 months.   # she had TAH- BSO  # Patient follows up with gastroenterology, and had colonoscopy and EGD in 2019.  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 Lacinda Axon.  # She was diagnosed with early stage Parkinson's disease.  Tremors have improved on  Sinemet.  Paraneoplastic antibody panel was negative on 08/11/2017.  She has been followed by Dr. Melrose Nakayama, neurologist.  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  # She has a history of lymphocytosis.  Flow cytometry on 08/05/2019 revealed no diagnostic immunophenotypic abnormality. There was absolute lymphocytosis. B-cell clonality was unable to be performed secondary to nonspecific light chain binding. Consider B cell gene rearrangement studies.   INTERVAL HISTORY Kelly Munoz is a 61 y.o. female who has above history reviewed by me today presents for follow up visit for history of breast cancer.  Patient takes tamoxifen 20 mg daily.  She tolerates well.  Manageable side effects.   Review of Systems  Constitutional:  Negative for appetite change, chills, fatigue and fever.  HENT:   Negative for hearing loss and voice change.   Eyes:  Negative for eye problems.  Respiratory:  Negative for chest tightness and cough.   Cardiovascular:  Negative for chest pain.  Gastrointestinal:  Negative for abdominal distention, abdominal pain and blood in stool.  Endocrine: Positive for hot flashes.  Genitourinary:  Negative for difficulty urinating and frequency.   Musculoskeletal:  Positive for arthralgias.  Skin:  Negative for itching and rash.  Neurological:  Negative for extremity weakness.  Hematological:  Negative for adenopathy.  Psychiatric/Behavioral:  Negative for confusion.    Past Medical History:  Diagnosis Date   Abnormal LFTs (liver function tests) 02/10/2017   Anal fissure 07/07/2017   Breast cancer ([place])    Breast cancer, right (  [place]) 06/08/2011   Family history of breast cancer    Family history of colon cancer    Family history of prostate cancer    History of IBS    History of kidney stones    Hypertension    Lymphocytosis 08/19/2019   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[facility])    Personal history of chemotherapy 2013    RIGHT lumpectomy   Personal history of radiation therapy 2013   RIGHT lumpectomy    Past Surgical History:  Procedure Laterality Date   ABDOMINAL HYSTERECTOMY     BREAST BIOPSY Right 2013   lumpectomy chem and rad   BREAST LUMPECTOMY Right 2013   COLONOSCOPY WITH PROPOFOL N/A 02/06/2017   Procedure: COLONOSCOPY WITH PROPOFOL;  Surgeon: Lollie Sails, MD;  Location: [facility] ENDOSCOPY;  Service: Endoscopy;  Laterality: N/A;   CYSTOSCOPY N/A 11/09/2015   Procedure: CYSTOSCOPY;  Surgeon: Honor Loh Ward, MD;  Location: [facility] ORS;  Service: Gynecology;  Laterality: N/A;   parotid stone      Removal   parotid stones Left    REDUCTION MAMMAPLASTY Bilateral 02/2013   TONSILLECTOMY      Family History  Problem Relation Age of Onset   Breast cancer Mother 71   Kidney cancer Father        dx in his mid 19s   Prostate cancer Maternal Uncle        dx in mid 67s   Stomach cancer Paternal Aunt        dx in mid 64s   Diabetes Maternal Grandmother    Heart disease Maternal Grandmother    Prostate cancer Maternal Grandfather    Heart disease Paternal Grandmother    Heart attack Paternal Grandfather    Prostate cancer Maternal Uncle        dx in mid 64s   Prostate cancer Maternal Uncle        dx in mid 56s   Colon cancer Maternal Uncle        dx in mid 21s   Colon cancer Cousin        paternal first cousin dx in his mid 65s   Colon cancer Cousin        paternal first cousin dx in his mid 16s    Social History:  reports that she has never smoked. She has never used smokeless tobacco. She reports that she does not drink alcohol and does not use drugs. She is from [place].  She moved to [place] at the end of 03/2015 to be with her family.  Her parents live in [place].  She lives with her husband.  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    Allergies:  Allergies  Allergen Reactions   Cat Hair Extract Other (See Comments)    Sneezing,watery eyes, throat closes    Zithromax [Azithromycin] Diarrhea   Clindamycin/Lincomycin Rash   Erythromycin Rash    Current Medications: Current Outpatient Medications  Medication Sig Dispense Refill   carbidopa-levodopa (SINEMET IR) 25-250 MG tablet      Cholecalciferol (VITAMIN D3) 5000 units CAPS Take 5,000 Units by mouth daily.     dicyclomine (BENTYL) 10 MG capsule Take 1 capsule (10 mg total) by mouth 3 (three) times daily as needed for spasms. 30 capsule 0   FISH OIL-KRILL OIL PO Take 1 tablet by mouth daily.     fluticasone (FLONASE) 50 MCG/ACT nasal spray Place 1-2 sprays into both nostrils daily as needed for allergies or  rhinitis.     mesalamine (LIALDA) 1.2 g EC tablet Take 2 tablets (2.4 g total) by mouth daily with breakfast. 180 tablet 3   metoprolol succinate (TOPROL-XL) 25 MG 24 hr tablet Take 25 mg by mouth daily.     tamoxifen (NOLVADEX) 20 MG tablet Take 1 tablet (20 mg total) by mouth daily. 90 tablet 1   No current facility-administered medications for this visit.     Performance status (ECOG):  0-1  Vitals Blood pressure (!) 145/95, pulse 98, temperature 98.9 F (37.2 C), temperature source Tympanic, resp. rate 16, height 5' 2"  (1.575 m), weight 152 lb 14.4 oz (69.4 kg), SpO2 100 %.   Physical Exam Constitutional:      General: She is not in acute distress.    Appearance: She is not diaphoretic.  HENT:     Head: Normocephalic and atraumatic.     Nose: Nose normal.     Mouth/Throat:     Pharynx: No oropharyngeal exudate.  Eyes:     General: No scleral icterus.    Pupils: Pupils are equal, round, and reactive to light.  Cardiovascular:     Rate and Rhythm: Normal rate and regular rhythm.     Heart sounds: No murmur heard. Pulmonary:     Effort: Pulmonary effort is normal. No respiratory distress.     Breath sounds: No rales.  Chest:     Chest wall: No tenderness.  Abdominal:     General: There is no distension.     Palpations: Abdomen is soft.     Tenderness: There is no  abdominal tenderness.  Musculoskeletal:        General: Normal range of motion.     Cervical back: Normal range of motion and neck supple.  Skin:    General: Skin is warm and dry.     Findings: No erythema.  Neurological:     Mental Status: She is alert and oriented to person, place, and time.     Cranial Nerves: No cranial nerve deficit.     Motor: No abnormal muscle tone.     Coordination: Coordination normal.  Psychiatric:        Mood and Affect: Affect normal.      RADIOGRAPHIC STUDIES: I have personally reviewed the radiological images as listed and agreed with the findings in the report. CT CARDIAC SCORING (SELF PAY ONLY)  Addendum Date: 12/28/2020   ADDENDUM REPORT: 12/28/2020 12:58 CLINICAL DATA:  Risk stratification EXAM: Coronary Calcium Score TECHNIQUE: The patient was scanned on a Siemens Somatom go.Top Scanner. Axial non-contrast 3 mm slices were carried out through the heart. The data set was analyzed on a dedicated work station and scored using the [place]. FINDINGS: Non-cardiac: See separate report from [facility]. Ascending Aorta: Normal size Pericardium: Normal Coronary arteries: Normal origin of left and right coronary arteries. Distribution of arterial calcifications if present, as noted below; LM 0 LAD 0 LCx 0 RCA 0 Total 0 IMPRESSION AND RECOMMENDATION: 1. Normal coronary calcium score of 0. Patient is low risk for coronary events. 2. CAC 0, CAC-DRS A0. 3. Continue heart healthy lifestyle and risk factor modification. Kate Sable Electronically Signed   By: Kate Sable M.D.   On: 12/28/2020 12:58   Result Date: 12/28/2020 EXAM: OVER-READ INTERPRETATION  CT CHEST The following report is an over-read performed by radiologist Dr. Rolm Baptise of [facility], [place] on 12/28/2020. This over-read does not include interpretation of cardiac or coronary anatomy or pathology. The coronary calcium  score interpretation by the cardiologist is  attached. COMPARISON:  None. FINDINGS: Vascular: Heart is normal size.  Aorta normal caliber. Mediastinum/Nodes: No adenopathy. Lungs/Pleura: 4 mm nodule in the right lower lobe. Linear scarring in the lung bases. No effusions. Upper Abdomen: Tiny central hypodensity in the liver, approximately 5 mm, too small to characterize. Musculoskeletal: Chest wall soft tissues are unremarkable. No acute bony abnormality. IMPRESSION: No visible coronary artery calcifications. Total coronary calcium score of 0. 4 mm right lower lobe pulmonary nodule. No follow-up needed if patient is low-risk. Non-contrast chest CT can be considered in 12 months if patient is high-risk. This recommendation follows the consensus statement: Guidelines for Management of Incidental Pulmonary Nodules Detected on CT Images: From the Fleischner Society 2017; Radiology 2017; 284:228-243. 5 mm hypodensity centrally in the liver, most likely small cyst or on this noncontrast study. Electronically Signed: By: Rolm Baptise M.D. On: 12/28/2020 12:01   MR BREAST BILATERAL W WO CONTRAST INC CAD  Result Date: 02/15/2021 CLINICAL DATA:  61 year old female for screening breast MRI. Patient is BRCA 2 positive with history of RIGHT breast cancer in 2013 with lumpectomy and radiation treatment. EXAM: BILATERAL BREAST MRI WITH AND WITHOUT CONTRAST TECHNIQUE: Multiplanar, multisequence MR images of both breasts were obtained prior to and following the intravenous administration of 7 ml of Gadavist Three-dimensional MR images were rendered by post-processing of the original MR data on an independent workstation. The three-dimensional MR images were interpreted, and findings are reported in the following complete MRI report for this study. Three dimensional images were evaluated at the independent interpreting workstation using the DynaCAD thin client. COMPARISON:  02/17/2020 and prior breast MRs. 08/12/2020 and prior mammograms. FINDINGS: Breast composition: b.  Scattered fibroglandular tissue. Background parenchymal enhancement: Minimal Right breast: No mass or abnormal enhancement. Lumpectomy changes within the LOWER OUTER RIGHT breast again noted. Left breast: No mass or abnormal enhancement. Lymph nodes: No abnormal appearing lymph nodes. Ancillary findings:  None. IMPRESSION: No MR evidence of breast malignancy. RECOMMENDATION: Bilateral screening mammogram in 6 months to resume annual mammogram schedule. Bilateral screening breast MRI in this BRCA 2 positive patient. BI-RADS CATEGORY  2: Benign. Electronically Signed   By: Margarette Canada M.D.   On: 02/15/2021 14:43   Laboratory Studies.  Patient gets blood work done at [facility].  Results are reviewed and scanned into EMR    Assessment and Plan:    1. 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 Malignant neoplasm of right breast in female, estrogen receptor positive, unspecified site of breast ([place])   3. Osteopenia, unspecified location   4. Elevated LFTs     # History of Stage IIB Triple positive breast cancer - 05/2011 S/p lumpectomy followed by adjvuant chemotherapy TCH x 6 followed by adjuvant RT, finished 1 year of transtuzumab.  On tamoxifen since 01/2012 08/11/2020 bilateral screening mammogram - Patient is postmenopausal, she prefers to stay on tamoxifen instead of switching to aromatase inhibitor. Labs from [facility] were reviewed.  Stable CA 27-29   # BRCA2 +  S/p TAH -BSO- 11/09/2015  Option of elective bilateral mastectomy was previously discussed with patient and she is undecided.   Stable normal CA125. 09/10/2020 annual pancrease MRI-no evidence of suspicious mass in the abdomen.  Fatty liver disease.  Continue annual MRI of the abdomen.  # Osteopenia  04/20/2020 DEXA right femur T score -1.7 Continue calcium and vitamin D supplementation.  # Abnormal LFT, stable.  Likely due to fatty liver disease.  Previous liver biopsy on 11/05/2019  revealed benign hepatic parenchyma with moderate macrovesicular  steatosis, mild portal inflammation with focal lobular activity, and focal ballooning degeneration. There was no evidence of malignancy.   #RTC: Mammogram in 6 months, lab MD .  I discussed the assessment and treatment plan with the patient.  The patient was provided an opportunity to ask questions and all were answered.  The patient agreed with the plan and demonstrated an understanding of the instructions.  The patient was advised to call back if the symptoms worsen or if the condition fails to improve as anticipated.   Earlie Server, MD, PhD Hematology Oncology  02/19/2021

## 2021-04-02 ENCOUNTER — Other Ambulatory Visit: Payer: Self-pay | Admitting: Oncology

## 2021-04-02 ENCOUNTER — Other Ambulatory Visit: Payer: Self-pay

## 2021-04-02 ENCOUNTER — Ambulatory Visit: Payer: BLUE CROSS/BLUE SHIELD | Attending: Internal Medicine

## 2021-04-02 DIAGNOSIS — Z23 Encounter for immunization: Secondary | ICD-10-CM

## 2021-04-02 MED ORDER — PFIZER COVID-19 VAC BIVALENT 30 MCG/0.3ML IM SUSP
INTRAMUSCULAR | 0 refills | Status: AC
  Filled 2021-04-02: qty 0.3, 1d supply, fill #0

## 2021-04-05 NOTE — Telephone Encounter (Signed)
Signing encounter, see previous note from 2/17 ?

## 2021-04-14 ENCOUNTER — Ambulatory Visit: Payer: BLUE CROSS/BLUE SHIELD | Admitting: Gastroenterology

## 2021-04-21 ENCOUNTER — Ambulatory Visit: Payer: BLUE CROSS/BLUE SHIELD | Admitting: Gastroenterology

## 2021-04-21 ENCOUNTER — Other Ambulatory Visit: Payer: Self-pay

## 2021-04-21 ENCOUNTER — Encounter: Payer: Self-pay | Admitting: Gastroenterology

## 2021-04-21 VITALS — BP 135/87 | HR 98 | Temp 98.2°F | Ht 62.0 in | Wt 153.4 lb

## 2021-04-21 DIAGNOSIS — K523 Indeterminate colitis: Secondary | ICD-10-CM

## 2021-04-21 MED ORDER — CLENPIQ 10-3.5-12 MG-GM -GM/160ML PO SOLN: 320.0000 mL | Freq: Once | ORAL | 0 refills | Status: AC

## 2021-04-21 NOTE — Progress Notes (Addendum)
?  ?[name], MD ?[address]  ?[address]  ?[place], [place] [address]  ?Main: [phone]  ?Fax: [phone] ? ? ? ?Gastroenterology Consultation ? ?Referring Provider:     Kirk Ruths, MD ?Primary Care Physician:  Kirk Ruths, MD ?Primary Gastroenterologist:  Dr. Lucilla Lame ?Reason for Consultation:     Indeterminate colitis ?      ? HPI:   ?Kelly Munoz is a 61 y.o. female referred by Dr. Ouida Sills, Ocie Cornfield, MD  for consultation & management of indeterminate colitis.  Patient has been treated for ulcerative colitis/Crohn's colitis since age of 51.  As a child, she was told that she had ileitis.  She has been maintained on mesalamine for several years.  Currently on Lialda 2.4 g daily.  She was under the care of a gastroenterologist in [place] for 22 years.  She was closely followed by [facility] gastroenterology for several years until recently when she switched her care to [facility] gastroenterology.  She was originally evaluated by Dr. Allen Norris for elevated transaminases, elevated ASMA, underwent liver biopsy which confirmed fatty liver only.  She is referred to me due to her history of indeterminate colitis.  Apparently, patient reports that she has been undergoing surveillance colonoscopies, every 5 years in the beginning followed by every 2 to 3 years.  Her last colonoscopy was in 2019 by Dr. Gustavo Lah, it was endoscopically normal, histology revealed focal mild active ileitis with no evidence of chronicity. ?Patient is a breast cancer survivor, on tamoxifen which is also resulted in weight gain ?Patient reports that she has been following anti-inflammatory diet in order to control her GI symptoms.  During flareups, depending on what she eats, she experiences abdominal bloating, nonbloody diarrhea ?Her CBC is normal, LFTs revealed mildly elevated AST and ALT ? ?Follow-up visit 03/24/2020 ?Patient reports having 3-4 soft bowel movements associated with some abdominal  bloating and this is not new for her.  She continues to take mesalamine 2.4 g daily.  Her weight is stable.  She is planning on a cruise for 2 weeks end of this week.  Trying to follow healthy diet, her weight has been stable ? ?Follow-up visit 07/15/2020 ?Patient is here for follow-up of indeterminate colitis.  She had stool studies which revealed C. difficile and treated with 10 days course of antibiotics.  Her fecal calprotectin levels, pancreatic fecal elastase test came back normal.  She reports more than 50% improvement in her GI symptoms.  Her main concern today is abdominal bloating.  She states she has been limiting dairy products.  Her weight has been stable ? ?Follow-up visit 10/15/2020 ?Patient is here for follow-up of indeterminate colitis.  She reports ongoing abdominal bloating, and her stools are mushy and sometimes 2-3 times daily about few days a week.  She has tried strict lactose-free diet which did not make any difference.  Celiac disease panel was negative.  Weight has been stable.  She continues to take mesalamine 2.4 g daily ? ?Follow-up visit 04/21/2021 ?Patient is here for follow-up of indeterminate colitis.  Patient stopped mesalamine completely, resulted in loose stools, therefore went back to mesalamine 2 pills daily for 2 days, when her stools turned more formed, decreased to 1 pill a day.  Currently she is taking 1.2 g daily and not experiencing any diarrhea.  She is here to discuss about surveillance colonoscopy.  Patient is planning to go on a [place] trip for about 1 month along with her husband.  She would like to schedule the colonoscopy after she returns from her trip ? ?NSAIDs: None ? ?Antiplts/Anticoagulants/Anti thrombotics: None ? ?Strong family history of colon cancer in maternal uncle in mid 11s, first cousin in mid 5s ? ?GI Procedures:  ?Colonoscopy 02/06/2017 ?- Diverticulosis in the sigmoid colon, in the descending colon and in the transverse colon. ?- One less than 1 mm  polyp in the rectum, removed with a cold biopsy forceps. Resected and retrieved. ?- The examined portion of the ileum was normal. ?- Biopsies were taken with a cold forceps from the cecum, ascending colon, transverse colon, descending colon, sigmoid colon and rectum for evaluation of microscopic colitis. ? ?DIAGNOSIS:  ?A.  COLON, GRANULAR MUCOSA AT ILEOCECAL VALVE; COLD BIOPSY:  ?- FOCAL MILD ACTIVE ILEITIS.  ?- NEGATIVE FOR DYSPLASIA AND MALIGNANCY.  ? ?B.  RANDOM TERMINAL ILEUM; COLD BIOPSY:  ?- FOCAL MILD ACTIVE ILEITIS.  ?- NEGATIVE FOR DYSPLASIA AND MALIGNANCY.  ? ?C.  COLON, RANDOM CECUM; COLD BIOPSY:  ?- NO PATHOLOGIC CHANGE.  ? ?D.  COLON, RANDOM ASCENDING; COLD BIOPSY:  ?- NO PATHOLOGIC CHANGE.  ? ?E.  RANDOM TRANSVERSE COLON; COLD BIOPSY:  ?- NO PATHOLOGIC CHANGE.  ? ?F.  RANDOM DESCENDING COLON; COLD BIOPSY:  ?- NO PATHOLOGIC CHANGE.  ? ?G.  RANDOM SIGMOID COLON; COLD BIOPSY:  ?- MILD CRYPT IRREGULARITIES.  ?- NEGATIVE FOR DYSPLASIA AND MALIGNANCY.  ? ?H.  RANDOM RECTUM; COLD BIOPSY:  ?- MILD CRYPT IRREGULARITIES.  ?- NEGATIVE FOR DYSPLASIA AND MALIGNANCY.  ? ?I.  RECTUM POLYP; COLD BIOPSY:  ?- POLYPOID COLONIC MUCOSA NEGATIVE FOR DYSPLASIA AND MALIGNANCY.  ? ?Liver biopsy ? ?DIAGNOSIS:  ?A. LIVER, RIGHT LOBE; ULTRASOUND-GUIDED CORE NEEDLE BIOPSY:  ?- BENIGN HEPATIC PARENCHYMA WITH MODERATE MACROVESICULAR STEATOSIS, MILD  ?PORTAL INFLAMMATION WITH FOCAL LOBULAR ACTIVITY, AND FOCAL BALLOONING  ?DEGENERATION.  ?- NEGATIVE FOR MALIGNANCY.  ? ?Past Medical History:  ?Diagnosis Date  ? Abnormal LFTs (liver function tests) 02/10/2017  ? Anal fissure 07/07/2017  ? Breast cancer ([place])   ? Breast cancer, right ([place]) 06/08/2011  ? Family history of breast cancer   ? Family history of colon cancer   ? Family history of prostate cancer   ? History of IBS   ? History of kidney stones   ? Hypertension   ? Lymphocytosis 08/19/2019  ? 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([place])   ? Personal history of chemotherapy 2013  ?  RIGHT lumpectomy  ? Personal history of radiation therapy 2013  ? RIGHT lumpectomy  ? ? ?Past Surgical History:  ?Procedure Laterality Date  ? ABDOMINAL HYSTERECTOMY    ? BREAST BIOPSY Right 2013  ? lumpectomy chem and rad  ? BREAST LUMPECTOMY Right 2013  ? COLONOSCOPY WITH PROPOFOL N/A 02/06/2017  ? Procedure: COLONOSCOPY WITH PROPOFOL;  Surgeon: Lollie Sails, MD;  Location: [facility] ENDOSCOPY;  Service: Endoscopy;  Laterality: N/A;  ? CYSTOSCOPY N/A 11/09/2015  ? Procedure: CYSTOSCOPY;  Surgeon: Honor Loh Ward, MD;  Location: [facility] ORS;  Service: Gynecology;  Laterality: N/A;  ? parotid stone     ? Removal  ? parotid stones Left   ? REDUCTION MAMMAPLASTY Bilateral 02/2013  ? TONSILLECTOMY    ? ? ? ?Current Outpatient Medications:  ?  carbidopa-levodopa (SINEMET IR) 25-250 MG tablet, , Disp: , Rfl:  ?  Cholecalciferol (VITAMIN D3) 5000 units CAPS, Take 5,000 Units by mouth daily., Disp: , Rfl:  ?  COVID-19 mRNA bivalent vaccine, Pfizer, (PFIZER COVID-19 VAC BIVALENT) injection, Inject into the  muscle., Disp: 0.3 mL, Rfl: 0 ?  dicyclomine (BENTYL) 10 MG capsule, Take 1 capsule (10 mg total) by mouth 3 (three) times daily as needed for spasms., Disp: 30 capsule, Rfl: 0 ?  FISH OIL-KRILL OIL PO, Take 1 tablet by mouth daily., Disp: , Rfl:  ?  fluticasone (FLONASE) 50 MCG/ACT nasal spray, Place 1-2 sprays into both nostrils daily as needed for allergies or rhinitis., Disp: , Rfl:  ?  lidocaine (LIDODERM) 5 %, 1 patch daily., Disp: , Rfl:  ?  mesalamine (LIALDA) 1.2 g EC tablet, Take 2 tablets (2.4 g total) by mouth daily with breakfast., Disp: 180 tablet, Rfl: 3 ?  methocarbamol (ROBAXIN) 500 MG tablet, Take 500 mg by mouth 3 (three) times daily as needed., Disp: , Rfl:  ?  metoprolol succinate (TOPROL-XL) 25 MG 24 hr tablet, Take 25 mg by mouth daily., Disp: , Rfl:  ?  Sod Picosulfate-Mag Ox-Cit Acd (CLENPIQ) 10-3.5-12 MG-GM -GM/160ML SOLN, Take 320 mLs by mouth once for 1 dose., Disp: 320 mL, Rfl: 0 ?  tamoxifen  (NOLVADEX) 20 MG tablet, Take 1 tablet by mouth daily., Disp: , Rfl:  ?  tretinoin (RETIN-A) 0.05 % cream, APPLY A PEA-SIZED AMOUNT TO FACE EVERY NIGHT AS DIRECTED, Disp: , Rfl:  ? ? ?Family History  ?Problem R

## 2021-08-02 ENCOUNTER — Encounter: Payer: Self-pay | Admitting: Gastroenterology

## 2021-08-02 ENCOUNTER — Ambulatory Visit: Payer: BLUE CROSS/BLUE SHIELD | Admitting: Certified Registered Nurse Anesthetist

## 2021-08-02 ENCOUNTER — Ambulatory Visit
Admission: RE | Admit: 2021-08-02 | Discharge: 2021-08-02 | Disposition: A | Discharge status: Home / Self Care | Payer: BLUE CROSS/BLUE SHIELD | Source: Ambulatory Visit | Attending: Gastroenterology | Admitting: Gastroenterology

## 2021-08-02 ENCOUNTER — Encounter
Admission: RE | Disposition: A | Discharge status: Home / Self Care | Payer: Self-pay | Source: Ambulatory Visit | Attending: Gastroenterology

## 2021-08-02 DIAGNOSIS — K523 Indeterminate colitis: Secondary | ICD-10-CM | POA: Diagnosis present

## 2021-08-02 HISTORY — PX: COLONOSCOPY WITH PROPOFOL: SHX5780

## 2021-08-02 SURGERY — COLONOSCOPY WITH PROPOFOL
Anesthesia: General

## 2021-08-02 MED ORDER — PROPOFOL 500 MG/50ML IV EMUL
INTRAVENOUS | Status: DC | PRN
  Administered 2021-08-02: 125 ug/kg/min via INTRAVENOUS

## 2021-08-02 MED ORDER — SODIUM CHLORIDE 0.9 % IV SOLN: INTRAVENOUS | Status: DC

## 2021-08-02 MED ORDER — LIDOCAINE HCL (CARDIAC) PF 100 MG/5ML IV SOSY
PREFILLED_SYRINGE | INTRAVENOUS | Status: DC | PRN
  Administered 2021-08-02: 50 mg via INTRAVENOUS

## 2021-08-02 MED ORDER — PROPOFOL 10 MG/ML IV BOLUS
INTRAVENOUS | Status: DC | PRN
  Administered 2021-08-02: 30 mg via INTRAVENOUS
  Administered 2021-08-02: 70 mg via INTRAVENOUS

## 2021-08-02 MED ORDER — ONDANSETRON HCL 4 MG/2ML IJ SOLN
INTRAMUSCULAR | Status: AC
  Filled 2021-08-02: qty 2

## 2021-08-02 MED ORDER — DEXMEDETOMIDINE (PRECEDEX) IN NS 20 MCG/5ML (4 MCG/ML) IV SYRINGE
PREFILLED_SYRINGE | INTRAVENOUS | Status: DC | PRN
  Administered 2021-08-02: 4 ug via INTRAVENOUS

## 2021-08-02 MED ORDER — DEXMEDETOMIDINE HCL IN NACL 80 MCG/20ML IV SOLN
INTRAVENOUS | Status: AC
  Filled 2021-08-02: qty 20

## 2021-08-02 MED ORDER — ONDANSETRON HCL 4 MG/2ML IJ SOLN
INTRAMUSCULAR | Status: DC | PRN
  Administered 2021-08-02: 4 mg via INTRAVENOUS

## 2021-08-02 NOTE — Transfer of Care (Signed)
Immediate Anesthesia Transfer of Care Note  Patient: Kelly Munoz  Procedure(s) Performed: COLONOSCOPY WITH PROPOFOL  Patient Location: PACU  Anesthesia Type:General  Level of Consciousness: drowsy  Airway & Oxygen Therapy: Patient Spontanous Breathing  Post-op Assessment: Report given to RN and Post -op Vital signs reviewed and stable  Post vital signs: Reviewed and stable  Last Vitals:  Vitals Value Taken Time  BP 118/74 08/02/21 0836  Temp    Pulse    Resp 16 08/02/21 0836  SpO2    Vitals shown include unvalidated device data.  Last Pain:  Vitals:   08/02/21 0741  TempSrc: Temporal  PainSc: 0-No pain         Complications: No notable events documented.

## 2021-08-02 NOTE — Anesthesia Preprocedure Evaluation (Signed)
Anesthesia Evaluation  Patient identified by MRN, date of birth, ID band Patient awake    Reviewed: Allergy & Precautions, NPO status , Patient's Chart, lab work & pertinent test results  History of Anesthesia Complications (+) PONV and history of anesthetic complications  Airway Mallampati: II  TM Distance: >3 FB Neck ROM: Full    Dental no notable dental hx. (+) Teeth Intact   Pulmonary neg pulmonary ROS, neg sleep apnea, neg COPD, Patient abstained from smoking.Not current smoker,    Pulmonary exam normal breath sounds clear to auscultation       Cardiovascular Exercise Tolerance: Good METShypertension, Pt. on medications (-) CAD and (-) Past MI (-) dysrhythmias  Rhythm:Regular Rate:Normal - Systolic murmurs    Neuro/Psych negative neurological ROS  negative psych ROS   GI/Hepatic neg GERD  ,(+)     (-) substance abuse  , Hepatitis -  Endo/Other  neg diabetes  Renal/GU negative Renal ROS     Musculoskeletal   Abdominal   Peds  Hematology   Anesthesia Other Findings Past Medical History: 02/10/2017: Abnormal LFTs (liver function tests) 07/07/2017: Anal fissure No date: Breast cancer ([place]) 06/08/2011: Breast cancer, right ([place]) No date: Family history of breast cancer No date: Family history of colon cancer No date: Family history of prostate cancer No date: History of IBS No date: History of kidney stones No date: Hypertension 08/19/2019: Lymphocytosis No date: 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: Parkinson disease ([place]) 2013: Personal history of chemotherapy     Comment:  RIGHT lumpectomy 2013: Personal history of radiation therapy     Comment:  RIGHT lumpectomy  Reproductive/Obstetrics                             Anesthesia Physical Anesthesia Plan  ASA: 2  Anesthesia Plan: General   Post-op Pain Management: Minimal or no pain anticipated    Induction: Intravenous  PONV Risk Score and Plan: 4 or greater and Propofol infusion, TIVA and Ondansetron  Airway Management Planned: Nasal Cannula  Additional Equipment: None  Intra-op Plan:   Post-operative Plan:   Informed Consent: I have reviewed the patients History and Physical, chart, labs and discussed the procedure including the risks, benefits and alternatives for the proposed anesthesia with the patient or authorized representative who has indicated his/her understanding and acceptance.     Dental advisory given  Plan Discussed with: CRNA and Surgeon  Anesthesia Plan Comments: (Discussed risks of anesthesia with patient, including possibility of difficulty with spontaneous ventilation under anesthesia necessitating airway intervention, PONV, and rare risks such as cardiac or respiratory or neurological events, and allergic reactions. Discussed the role of CRNA in patient's perioperative care. Patient understands.)        Anesthesia Quick Evaluation

## 2021-08-02 NOTE — H&P (Signed)
[name], MD [address]  [place]  [place], [place] [address]  Main: [phone]  Fax: [phone] Pager: [phone]  Primary Care Physician:  Kirk Ruths, MD Primary Gastroenterologist:  Dr. [name]  Pre-Procedure History & Physical: HPI:  Kelly Munoz is a 61 y.o. female is here for an colonoscopy.   Past Medical History:  Diagnosis Date   Abnormal LFTs (liver function tests) 02/10/2017   Anal fissure 07/07/2017   Breast cancer ([place])    Breast cancer, right ([place]) 06/08/2011   Family history of breast cancer    Family history of colon cancer    Family history of prostate cancer    History of IBS    History of kidney stones    Hypertension    Lymphocytosis 08/19/2019   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([place])    Parkinson disease ([place]) 2020   Personal history of chemotherapy 2013   RIGHT lumpectomy   Personal history of radiation therapy 2013   RIGHT lumpectomy    Past Surgical History:  Procedure Laterality Date   ABDOMINAL HYSTERECTOMY     BREAST BIOPSY Right 2013   lumpectomy chem and rad   BREAST LUMPECTOMY Right 2013   COLONOSCOPY WITH PROPOFOL N/A 02/06/2017   Procedure: COLONOSCOPY WITH PROPOFOL;  Surgeon: Lollie Sails, MD;  Location: [facility] ENDOSCOPY;  Service: Endoscopy;  Laterality: N/A;   CYSTOSCOPY N/A 11/09/2015   Procedure: CYSTOSCOPY;  Surgeon: Honor Loh Ward, MD;  Location: [facility] ORS;  Service: Gynecology;  Laterality: N/A;   parotid stone      Removal   parotid stones Left    REDUCTION MAMMAPLASTY Bilateral 02/2013   TONSILLECTOMY      Prior to Admission medications   Medication Sig Start Date End Date Taking? Authorizing Provider  carbidopa-levodopa (SINEMET IR) 25-250 MG tablet  08/06/18  Yes [provider]  metoprolol succinate (TOPROL-XL) 25 MG 24 hr tablet Take 25 mg by mouth daily.   Yes [provider]  tamoxifen (NOLVADEX) 20 MG tablet Take 1 tablet by mouth daily. 07/15/15   Yes [provider]  Cholecalciferol (VITAMIN D3) 5000 units CAPS Take 5,000 Units by mouth daily.    [provider]  COVID-19 mRNA bivalent vaccine, Pfizer, (PFIZER COVID-19 VAC BIVALENT) injection Inject into the muscle. 04/02/21   Carlyle Basques, MD  dicyclomine (BENTYL) 10 MG capsule Take 1 capsule (10 mg total) by mouth 3 (three) times daily as needed for spasms. 03/24/20   Lin Landsman, MD  FISH OIL-KRILL OIL PO Take 1 tablet by mouth daily.    [provider]  fluticasone (FLONASE) 50 MCG/ACT nasal spray Place 1-2 sprays into both nostrils daily as needed for allergies or rhinitis.    [provider]  lidocaine (LIDODERM) 5 % 1 patch daily. 04/16/21   [provider]  mesalamine (LIALDA) 1.2 g EC tablet Take 2 tablets (2.4 g total) by mouth daily with breakfast. 11/16/20   Kazaria Gaertner, Tally Due, MD  methocarbamol (ROBAXIN) 500 MG tablet Take 500 mg by mouth 3 (three) times daily as needed. 04/16/21   [provider]  tretinoin (RETIN-A) 0.05 % cream APPLY A PEA-SIZED AMOUNT TO FACE EVERY NIGHT AS DIRECTED 01/25/21   [provider]    Allergies as of 04/21/2021 - Review Complete 04/21/2021  Allergen Reaction Noted   Cat hair extract Other (See Comments) 12/09/2020   Zithromax [azithromycin] Diarrhea 08/11/2015   Clindamycin/lincomycin Rash 08/11/2015   Erythromycin Rash 08/11/2015  Family History  Problem Relation Age of Onset   Breast cancer Mother 29   Kidney cancer Father        dx in his mid 29s   Prostate cancer Maternal Uncle        dx in mid 31s   Stomach cancer Paternal Aunt        dx in mid 59s   Diabetes Maternal Grandmother    Heart disease Maternal Grandmother    Prostate cancer Maternal Grandfather    Heart disease Paternal Grandmother    Heart attack Paternal Grandfather    Prostate cancer Maternal Uncle        dx in mid 33s   Prostate cancer Maternal Uncle        dx in mid 52s   Colon cancer  Maternal Uncle        dx in mid 26s   Colon cancer Cousin        paternal first cousin dx in his mid 67s   Colon cancer Cousin        paternal first cousin dx in his mid 69s    Social History   Socioeconomic History   Marital status: Married    Spouse name: Not on file   Number of children: Not on file   Years of education: Not on file   Highest education level: Not on file  Occupational History   Not on file  Tobacco Use   Smoking status: Never   Smokeless tobacco: Never  Vaping Use   Vaping Use: Never used  Substance and Sexual Activity   Alcohol use: No   Drug use: No   Sexual activity: Not on file  Other Topics Concern   Not on file  Social History Narrative   Not on file   Social Determinants of Health   Financial Resource Strain: Not on file  Food Insecurity: Not on file  Transportation Needs: Not on file  Physical Activity: Not on file  Stress: Not on file  Social Connections: Not on file  Intimate Partner Violence: Not on file    Review of Systems: See HPI, otherwise negative ROS  Physical Exam: BP (!) 155/95   Pulse (!) 103   Temp (!) 96 F (35.6 C) (Temporal)   Resp 20   Ht 5' 2"  (1.575 m)   Wt 70.3 kg   SpO2 100%   BMI 28.35 kg/m  General:   Alert,  pleasant and cooperative in NAD Head:  Normocephalic and atraumatic. Neck:  Supple; no masses or thyromegaly. Lungs:  Clear throughout to auscultation.    Heart:  Regular rate and rhythm. Abdomen:  Soft, nontender and nondistended. Normal bowel sounds, without guarding, and without rebound.   Neurologic:  Alert and  oriented x4;  grossly normal neurologically.  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  Questions have been answered.  All parties agreeable.   Sherri Sear, MD  08/02/2021, 7:50 AM

## 2021-08-02 NOTE — Anesthesia Postprocedure Evaluation (Signed)
Anesthesia Post Note  Patient: Kelly Munoz  Procedure(s) Performed: COLONOSCOPY WITH PROPOFOL  Patient location during evaluation: Endoscopy Anesthesia Type: General Level of consciousness: awake and alert Pain management: pain level controlled Vital Signs Assessment: post-procedure vital signs reviewed and stable Respiratory status: spontaneous breathing, nonlabored ventilation, respiratory function stable and patient connected to nasal cannula oxygen Cardiovascular status: blood pressure returned to baseline and stable Postop Assessment: no apparent nausea or vomiting Anesthetic complications: no   No notable events documented.   Last Vitals:  Vitals:   08/02/21 0855 08/02/21 0905  BP: 118/81 137/82  Pulse: 76 72  Resp: 14 10  Temp:    SpO2: 99% 99%    Last Pain:  Vitals:   08/02/21 0905  TempSrc:   PainSc: 0-No pain                 Arita Miss

## 2021-08-02 NOTE — Op Note (Signed)
[facility] Gastroenterology Patient Name: Kelly Munoz Procedure Date: 08/02/2021 8:03 AM MRN: [id number] Account #: [id number] Date of Birth: Apr 01, 1960 Admit Type: Outpatient Age: 61 Room: [facility] ENDO ROOM 4 Gender: Female Note Status: Finalized Instrument Name: Peds Colonoscope [id number] Procedure:             Colonoscopy Indications:           Last colonoscopy: January 2019, Last colonoscopy 5                         years ago, Indeterminate colitis Providers:             Lin Landsman MD, MD Referring MD:          Ocie Cornfield. Ouida Sills MD, MD (Referring MD) Medicines:             General Anesthesia Complications:         No immediate complications. Estimated blood loss: None. Procedure:             Pre-Anesthesia Assessment:                        - Prior to the procedure, a History and Physical was                         performed, and patient medications and allergies were                         reviewed. The patient is competent. The risks and                         benefits of the procedure and the sedation options and                         risks were discussed with the patient. All questions                         were answered and informed consent was obtained.                         Patient identification and proposed procedure were                         verified by the physician, the nurse, the                         anesthesiologist, the anesthetist and the technician                         in the pre-procedure area in the procedure room in the                         endoscopy suite. Mental Status Examination: alert and                         oriented. Airway Examination: normal oropharyngeal                         airway and neck mobility. Respiratory Examination:  clear to auscultation. CV Examination: normal.                         Prophylactic Antibiotics: The patient does not require                          prophylactic antibiotics. Prior Anticoagulants: The                         patient has taken no previous anticoagulant or                         antiplatelet agents. ASA Grade Assessment: II - A                         patient with mild systemic disease. After reviewing                         the risks and benefits, the patient was deemed in                         satisfactory condition to undergo the procedure. The                         anesthesia plan was to use general anesthesia.                         Immediately prior to administration of medications,                         the patient was re-assessed for adequacy to receive                         sedatives. The heart rate, respiratory rate, oxygen                         saturations, blood pressure, adequacy of pulmonary                         ventilation, and response to care were monitored                         throughout the procedure. The physical status of the                         patient was re-assessed after the procedure.                        After obtaining informed consent, the colonoscope was                         passed under direct vision. Throughout the procedure,                         the patient's blood pressure, pulse, and oxygen                         saturations were monitored continuously. The  Colonoscope was introduced through the anus and                         advanced to the 10 cm into the ileum. The colonoscopy                         was unusually difficult due to restricted mobility of                         the colon. Successful completion of the procedure was                         aided by withdrawing the scope and replacing with the                         pediatric colonoscope. The patient tolerated the                         procedure well. The quality of the bowel preparation                         was evaluated using the BBPS [facility] Bowel  Preparation                         Scale) with scores of: Right Colon = 2 (minor amount                         of residual staining, small fragments of stool and/or                         opaque liquid, but mucosa seen well), Transverse Colon                         = 3 (entire mucosa seen well with no residual                         staining, small fragments of stool or opaque liquid)                         and Left Colon = 3 (entire mucosa seen well with no                         residual staining, small fragments of stool or opaque                         liquid). The total BBPS score equals 8. Findings:      The perianal and digital rectal examinations were normal. Pertinent       negatives include normal sphincter tone and no palpable rectal lesions.      The terminal ileum appeared normal.      The colon (entire examined portion) appeared normal. Estimated blood       loss: none. Biopsies were taken with a cold forceps for histology.      The retroflexed view of the distal rectum and anal verge was normal and       showed no anal or rectal abnormalities. Impression:            -  The examined portion of the ileum was normal.                        - The entire examined colon is normal. Biopsied.                        - The distal rectum and anal verge are normal on                         retroflexion view. Recommendation:        - Discharge patient to home (with escort).                        - Resume regular diet today.                        - Continue present medications.                        - Await pathology results.                        - Repeat colonoscopy in 5 years for surveillance. Procedure Code(s):     --- Professional ---                        [phone], Colonoscopy, flexible; with biopsy, single or                         multiple CPT copyright 2019 [employer]. All rights reserved. The codes documented in this report are preliminary and  upon coder review may  be revised to meet current compliance requirements. Dr. Ulyess Mort Lin Landsman MD, MD 08/02/2021 8:35:41 AM This report has been signed electronically. Number of Addenda: 0 Note Initiated On: 08/02/2021 8:03 AM Scope Withdrawal Time: 0 hours 11 minutes 52 seconds  Total Procedure Duration: 0 hours 22 minutes 14 seconds  Estimated Blood Loss:  Estimated blood loss: none.      [facility]

## 2021-08-03 ENCOUNTER — Encounter: Payer: Self-pay | Admitting: Gastroenterology

## 2021-08-03 LAB — SURGICAL PATHOLOGY

## 2021-08-04 ENCOUNTER — Encounter: Payer: Self-pay | Admitting: Gastroenterology

## 2021-08-13 ENCOUNTER — Ambulatory Visit
Admission: RE | Admit: 2021-08-13 | Discharge: 2021-08-13 | Disposition: A | Discharge status: Home / Self Care | Payer: BLUE CROSS/BLUE SHIELD | Source: Ambulatory Visit | Attending: Oncology | Admitting: Oncology

## 2021-08-13 DIAGNOSIS — Z1509 Genetic susceptibility to other malignant neoplasm: Secondary | ICD-10-CM | POA: Diagnosis not present

## 2021-08-13 DIAGNOSIS — Z1231 Encounter for screening mammogram for malignant neoplasm of breast: Secondary | ICD-10-CM | POA: Diagnosis present

## 2021-08-13 DIAGNOSIS — Z170 Estrogen receptor positive status [ER+]: Secondary | ICD-10-CM | POA: Insufficient documentation

## 2021-08-13 DIAGNOSIS — Z1501 Genetic susceptibility to malignant neoplasm of breast: Secondary | ICD-10-CM | POA: Insufficient documentation

## 2021-08-13 DIAGNOSIS — C50911 Malignant neoplasm of unspecified site of right female breast: Secondary | ICD-10-CM | POA: Insufficient documentation

## 2021-08-16 ENCOUNTER — Encounter: Payer: Self-pay | Admitting: Oncology

## 2021-08-19 ENCOUNTER — Encounter: Payer: Self-pay | Admitting: Oncology

## 2021-08-19 ENCOUNTER — Inpatient Hospital Stay: Payer: BLUE CROSS/BLUE SHIELD | Attending: Oncology | Admitting: Oncology

## 2021-08-19 VITALS — BP 129/92 | HR 108 | Temp 99.3°F | Resp 18 | Wt 155.6 lb

## 2021-08-19 DIAGNOSIS — Z90722 Acquired absence of ovaries, bilateral: Secondary | ICD-10-CM | POA: Diagnosis not present

## 2021-08-19 DIAGNOSIS — Z79810 Long term (current) use of selective estrogen receptor modulators (SERMs): Secondary | ICD-10-CM | POA: Diagnosis not present

## 2021-08-19 DIAGNOSIS — Z923 Personal history of irradiation: Secondary | ICD-10-CM | POA: Diagnosis not present

## 2021-08-19 DIAGNOSIS — Z800 Family history of malignant neoplasm of digestive organs: Secondary | ICD-10-CM | POA: Diagnosis not present

## 2021-08-19 DIAGNOSIS — Z1501 Genetic susceptibility to malignant neoplasm of breast: Secondary | ICD-10-CM | POA: Insufficient documentation

## 2021-08-19 DIAGNOSIS — Z803 Family history of malignant neoplasm of breast: Secondary | ICD-10-CM | POA: Diagnosis not present

## 2021-08-19 DIAGNOSIS — Z881 Allergy status to other antibiotic agents status: Secondary | ICD-10-CM | POA: Insufficient documentation

## 2021-08-19 DIAGNOSIS — Z8249 Family history of ischemic heart disease and other diseases of the circulatory system: Secondary | ICD-10-CM | POA: Diagnosis not present

## 2021-08-19 DIAGNOSIS — G629 Polyneuropathy, unspecified: Secondary | ICD-10-CM | POA: Diagnosis not present

## 2021-08-19 DIAGNOSIS — Z170 Estrogen receptor positive status [ER+]: Secondary | ICD-10-CM | POA: Diagnosis not present

## 2021-08-19 DIAGNOSIS — Z853 Personal history of malignant neoplasm of breast: Secondary | ICD-10-CM | POA: Insufficient documentation

## 2021-08-19 DIAGNOSIS — C50911 Malignant neoplasm of unspecified site of right female breast: Secondary | ICD-10-CM | POA: Diagnosis present

## 2021-08-19 DIAGNOSIS — Z833 Family history of diabetes mellitus: Secondary | ICD-10-CM | POA: Insufficient documentation

## 2021-08-19 DIAGNOSIS — M8580 Other specified disorders of bone density and structure, unspecified site: Secondary | ICD-10-CM | POA: Diagnosis not present

## 2021-08-19 DIAGNOSIS — Z1509 Genetic susceptibility to other malignant neoplasm: Secondary | ICD-10-CM | POA: Insufficient documentation

## 2021-08-19 DIAGNOSIS — Z87442 Personal history of urinary calculi: Secondary | ICD-10-CM | POA: Insufficient documentation

## 2021-08-19 DIAGNOSIS — R7989 Other specified abnormal findings of blood chemistry: Secondary | ICD-10-CM | POA: Insufficient documentation

## 2021-08-19 DIAGNOSIS — Z9221 Personal history of antineoplastic chemotherapy: Secondary | ICD-10-CM | POA: Diagnosis not present

## 2021-08-19 DIAGNOSIS — Z8042 Family history of malignant neoplasm of prostate: Secondary | ICD-10-CM | POA: Diagnosis not present

## 2021-08-19 DIAGNOSIS — Z79899 Other long term (current) drug therapy: Secondary | ICD-10-CM | POA: Diagnosis not present

## 2021-08-19 DIAGNOSIS — D72820 Lymphocytosis (symptomatic): Secondary | ICD-10-CM | POA: Diagnosis not present

## 2021-08-19 DIAGNOSIS — G20 Parkinson's disease: Secondary | ICD-10-CM | POA: Diagnosis not present

## 2021-08-19 DIAGNOSIS — Z8051 Family history of malignant neoplasm of kidney: Secondary | ICD-10-CM | POA: Insufficient documentation

## 2021-08-20 NOTE — Progress Notes (Signed)
Hematology/Oncology Progress note [phone] [phone]      Clinic Day:  08/20/2021  Referring physician: Kirk Ruths, MD  Chief Complaint: Kelly Munoz is a 61 y.o. female with stage IIB Her2/neu + right breast cancer and a BRCA2 mutation who is seen for 6 month assessment.   PERTINENT ONCOLOGY HISTORY Patient previously followed up by Dr.Corcoran, patient switched care to me on 08/27/20 Extensive medical record review was performed by me  06/08/2011. Stage IIB Her2/neu + right breast cancer s/p right partial mastectomy and sentinel lymph node biopsy.[facility] [place] Pathology revealed a 3 cm grade III invasive ductal carcinoma.  There was solid, cribriform and comedo DCIS with necrosis.  One of 5 lymph nodes were positive.  There was a macro-metastasis in one intramammary lymph node.  Tumor was ER positive (88.8%), PR positive (90.93%), and Her2/neu 3+.  Ki-67 was 48%.   Pathologic stage was [id number].    Adjuvant chemotherapy  6 cycles of TCH chemotherapy beginning 07/11/2011.  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  She completed 1 year of adjuvant Herceptin.  per Dr.Corcoran's note  her EF decreased (no records available).  EF returned to normal per patient report (last echo was in 2014).   Adjuvant radiation (completed before the end of 2013).     01/2012 Tamoxifen   My Risk Genetic testing on 07/13/2015 revealed a BRCA2 mutation (c.5946del). 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   Patient has been on breast MRI alternate with mammogram every 6 months.   # she had TAH- BSO  # Patient follows up with gastroenterology, and had colonoscopy and EGD in 2019.  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 Lacinda Axon.  # She was diagnosed with early stage Parkinson's disease.  Tremors have improved on  Sinemet.  Paraneoplastic antibody panel was negative on 08/11/2017.  She has been followed by Dr. Melrose Nakayama, neurologist.  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  # She has a history of lymphocytosis.  Flow cytometry on 08/05/2019 revealed no diagnostic immunophenotypic abnormality. There was absolute lymphocytosis. B-cell clonality was unable to be performed secondary to nonspecific light chain binding. Consider B cell gene rearrangement studies.   INTERVAL HISTORY Kelly Munoz is a 61 y.o. female who has above history reviewed by me today presents for follow up visit for history of breast cancer.  Patient takes tamoxifen 20 mg daily.  Patient tolerates well with manageable side effect No new breast concerns.   Review of Systems  Constitutional:  Negative for appetite change, chills, fatigue and fever.  HENT:   Negative for hearing loss and voice change.   Eyes:  Negative for eye problems.  Respiratory:  Negative for chest tightness and cough.   Cardiovascular:  Negative for chest pain.  Gastrointestinal:  Negative for abdominal distention, abdominal pain and blood in stool.  Endocrine: Positive for hot flashes.  Genitourinary:  Negative for difficulty urinating and frequency.   Musculoskeletal:  Positive for arthralgias.  Skin:  Negative for itching and rash.  Neurological:  Negative for extremity weakness.  Hematological:  Negative for adenopathy.  Psychiatric/Behavioral:  Negative for confusion.     Past Medical History:  Diagnosis Date   Abnormal LFTs (liver function tests) 02/10/2017   Anal fissure 07/07/2017   Breast cancer ([place])  Breast cancer, right ([place]) 06/08/2011   Family history of breast cancer    Family history of colon cancer    Family history of prostate cancer    History of IBS    History of kidney stones    Hypertension    Lymphocytosis 08/19/2019   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([place])     Parkinson disease ([place]) 2020   Personal history of chemotherapy 2013   RIGHT lumpectomy   Personal history of radiation therapy 2013   RIGHT lumpectomy    Past Surgical History:  Procedure Laterality Date   ABDOMINAL HYSTERECTOMY     BREAST BIOPSY Right 2013   lumpectomy chem and rad   BREAST LUMPECTOMY Right 2013   COLONOSCOPY WITH PROPOFOL N/A 02/06/2017   Procedure: COLONOSCOPY WITH PROPOFOL;  Surgeon: Lollie Sails, MD;  Location[facility] ENDOSCOPY;  Service: Endoscopy;  Laterality: N/A;   COLONOSCOPY WITH PROPOFOL N/A 08/02/2021   Procedure: COLONOSCOPY WITH PROPOFOL;  Surgeon: Lin Landsman, MD;  Location: [facility] ENDOSCOPY;  Service: Gastroenterology;  Laterality: N/A;   CYSTOSCOPY N/A 11/09/2015   Procedure: CYSTOSCOPY;  Surgeon: Honor Loh Ward, MD;  Location: [facility] ORS;  Service: Gynecology;  Laterality: N/A;   parotid stone      Removal   parotid stones Left    REDUCTION MAMMAPLASTY Bilateral 02/2013   TONSILLECTOMY      Family History  Problem Relation Age of Onset   Breast cancer Mother 72   Kidney cancer Father        dx in his mid 41s   Prostate cancer Maternal Uncle        dx in mid 37s   Stomach cancer Paternal Aunt        dx in mid 30s   Diabetes Maternal Grandmother    Heart disease Maternal Grandmother    Prostate cancer Maternal Grandfather    Heart disease Paternal Grandmother    Heart attack Paternal Grandfather    Prostate cancer Maternal Uncle        dx in mid 70s   Prostate cancer Maternal Uncle        dx in mid 22s   Colon cancer Maternal Uncle        dx in mid 66s   Colon cancer Cousin        paternal first cousin dx in his mid 27s   Colon cancer Cousin        paternal first cousin dx in his mid 42s    Social History:  reports that she has never smoked. She has never used smokeless tobacco. She reports that she does not drink alcohol and does not use drugs. She is from [place].  She moved to [place] at the end of 03/2015 to be  with her family.  Her parents live in [place].  She lives with her husband.  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    Allergies:  Allergies  Allergen Reactions   Cat Hair Extract Other (See Comments)    Sneezing,watery eyes, throat closes   Zithromax [Azithromycin] Diarrhea   Clindamycin/Lincomycin Rash   Erythromycin Rash    Current Medications: Current Outpatient Medications  Medication Sig Dispense Refill   carbidopa-levodopa (SINEMET IR) 25-250 MG tablet      Cholecalciferol (VITAMIN D3) 5000 units CAPS Take 5,000 Units by mouth daily.     FISH OIL-KRILL OIL PO Take 1 tablet by mouth daily.     mesalamine (LIALDA) 1.2 g EC tablet  Take 2 tablets (2.4 g total) by mouth daily with breakfast. 180 tablet 3   metoprolol succinate (TOPROL-XL) 25 MG 24 hr tablet Take 25 mg by mouth daily.     tamoxifen (NOLVADEX) 20 MG tablet Take 1 tablet by mouth daily.     tretinoin (RETIN-A) 0.05 % cream APPLY A PEA-SIZED AMOUNT TO FACE EVERY NIGHT AS DIRECTED     COVID-19 mRNA bivalent vaccine, Pfizer, (PFIZER COVID-19 VAC BIVALENT) injection Inject into the muscle. (Patient not taking: Reported on 08/19/2021) 0.3 mL 0   fluticasone (FLONASE) 50 MCG/ACT nasal spray Place 1-2 sprays into both nostrils daily as needed for allergies or rhinitis. (Patient not taking: Reported on 08/19/2021)     lidocaine (LIDODERM) 5 % 1 patch daily. (Patient not taking: Reported on 08/19/2021)     methocarbamol (ROBAXIN) 500 MG tablet Take 500 mg by mouth 3 (three) times daily as needed. (Patient not taking: Reported on 08/19/2021)     No current facility-administered medications for this visit.     Performance status (ECOG):  0-1  Vitals Blood pressure (!) 129/92, pulse (!) 108, temperature 99.3 F (37.4 C), resp. rate 18, weight 155 lb 9.6 oz (70.6 kg).   Physical Exam Constitutional:      General: She is not in acute distress.    Appearance: She is not diaphoretic.  HENT:     Head: Normocephalic and  atraumatic.     Mouth/Throat:     Pharynx: No oropharyngeal exudate.  Eyes:     General: No scleral icterus. Cardiovascular:     Rate and Rhythm: Normal rate.     Heart sounds: No murmur heard. Pulmonary:     Effort: Pulmonary effort is normal. No respiratory distress.     Breath sounds: No wheezing.  Abdominal:     General: Bowel sounds are normal. There is no distension.     Palpations: Abdomen is soft.     Tenderness: There is no abdominal tenderness.  Musculoskeletal:        General: Normal range of motion.     Cervical back: Normal range of motion and neck supple.  Skin:    General: Skin is warm and dry.     Findings: No erythema.  Neurological:     Mental Status: She is alert and oriented to person, place, and time.     Cranial Nerves: No cranial nerve deficit.     Motor: No abnormal muscle tone.     Coordination: Coordination normal.  Psychiatric:        Mood and Affect: Mood and affect normal.    Breast exam was performed in seated and lying down position. Patient is status post right breast lumpectomy with a well-healed surgical scar.  Right breast with inferior scarring s/p surgery and radiation.  no discrete palpable masses..  Left breast medial scarring s/p breast reduction without masses, skin changes or nipple discharge.  No axillary lymphadenopathy.    RADIOGRAPHIC STUDIES: I have personally reviewed the radiological images as listed and agreed with the findings in the report. MM 3D SCREEN BREAST BILATERAL  Result Date: 08/16/2021 CLINICAL DATA:  Screening. EXAM: DIGITAL SCREENING BILATERAL MAMMOGRAM WITH TOMOSYNTHESIS AND CAD TECHNIQUE: Bilateral screening digital craniocaudal and mediolateral oblique mammograms were obtained. Bilateral screening digital breast tomosynthesis was performed. The images were evaluated with computer-aided detection. COMPARISON:  Previous exam(s). ACR Breast Density Category b: There are scattered areas of fibroglandular density.  FINDINGS: There are no findings suspicious for malignancy. IMPRESSION: No mammographic evidence of  malignancy. A result letter of this screening mammogram will be mailed directly to the patient. RECOMMENDATION: Screening mammogram in one year. (Code:SM-B-01Y) BI-RADS CATEGORY  1: Negative. Electronically Signed   By: Marin Olp M.D.   On: 08/16/2021 14:00    Laboratory Studies.  Patient gets blood work done at [facility].  Results are reviewed and scanned into EMR    Assessment and Plan:    1. Malignant neoplasm of right breast in female, estrogen receptor positive, unspecified site of breast ([place])   2. 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   3. Osteopenia, unspecified location   4. Elevated LFTs     # History of Stage IIB Triple positive breast cancer - 05/2011 S/p lumpectomy followed by adjvuant chemotherapy TCH x 6 followed by adjuvant RT, finished 1 year of transtuzumab.  On tamoxifen since 01/2012 Patient is postmenopausal, she prefers to stay on tamoxifen instead of switching to aromatase inhibitor. 08/16/2021, bilateral screening mammogram negative for malignancy.   Obtain MRI bilateral breast.  She is aware about her option of bilateral prophylactic mastectomy. Labs from [place] were reviewed.  Stable CA 27-29   # BRCA2 +  S/p TAH -BSO- 11/09/2015  Stable normal CA125. Obtain annual MRI MRCP for pancreatic cancer screening.  # Osteopenia  04/20/2020 DEXA right femur T score -1.7 Continue calcium.  And vitamin D supplementation.  # Abnormal LFT, stable.  Likely due to fatty liver disease.  Previous liver biopsy on 11/05/2019 revealed benign hepatic parenchyma with moderate macrovesicular steatosis, mild portal inflammation with focal lobular activity, and focal ballooning degeneration. There was no evidence of malignancy.   #RTC:    6 months, lab MD .  I discussed the assessment and treatment plan with the patient.  The patient was provided an opportunity to ask questions and all were  answered.  The patient agreed with the plan and demonstrated an understanding of the instructions.  The patient was advised to call back if the symptoms worsen or if the condition fails to improve as anticipated.   Earlie Server, MD, PhD Hematology Oncology  08/20/2021

## 2021-09-16 ENCOUNTER — Other Ambulatory Visit: Payer: Self-pay | Admitting: Oncology

## 2021-09-16 ENCOUNTER — Ambulatory Visit
Admission: RE | Admit: 2021-09-16 | Discharge: 2021-09-16 | Disposition: A | Discharge status: Home / Self Care | Payer: BLUE CROSS/BLUE SHIELD | Source: Ambulatory Visit | Attending: Oncology | Admitting: Oncology

## 2021-09-16 DIAGNOSIS — Z170 Estrogen receptor positive status [ER+]: Secondary | ICD-10-CM | POA: Diagnosis present

## 2021-09-16 DIAGNOSIS — C50911 Malignant neoplasm of unspecified site of right female breast: Secondary | ICD-10-CM | POA: Diagnosis present

## 2021-09-16 MED ORDER — GADOBUTROL 1 MMOL/ML IV SOLN
7.0000 mL | Freq: Once | INTRAVENOUS | Status: AC | PRN
  Administered 2021-09-16: 7 mL via INTRAVENOUS

## 2021-11-25 ENCOUNTER — Other Ambulatory Visit: Payer: Self-pay | Admitting: Gastroenterology

## 2021-12-19 ENCOUNTER — Ambulatory Visit
Admission: EM | Admit: 2021-12-19 | Discharge: 2021-12-19 | Disposition: A | Discharge status: Home / Self Care | Payer: BLUE CROSS/BLUE SHIELD | Attending: Urgent Care | Admitting: Urgent Care

## 2021-12-19 ENCOUNTER — Other Ambulatory Visit
Admission: RE | Admit: 2021-12-19 | Discharge: 2021-12-19 | Disposition: A | Discharge status: Home / Self Care | Payer: BLUE CROSS/BLUE SHIELD | Source: Ambulatory Visit | Attending: Urgent Care | Admitting: Urgent Care

## 2021-12-19 DIAGNOSIS — Z1152 Encounter for screening for COVID-19: Secondary | ICD-10-CM | POA: Insufficient documentation

## 2021-12-19 DIAGNOSIS — J069 Acute upper respiratory infection, unspecified: Secondary | ICD-10-CM | POA: Diagnosis not present

## 2021-12-19 DIAGNOSIS — J101 Influenza due to other identified influenza virus with other respiratory manifestations: Secondary | ICD-10-CM | POA: Diagnosis not present

## 2021-12-19 DIAGNOSIS — R6889 Other general symptoms and signs: Secondary | ICD-10-CM | POA: Diagnosis not present

## 2021-12-19 LAB — RESP PANEL BY RT-PCR (RSV, FLU A&B, COVID)  RVPGX2
Influenza A by PCR: POSITIVE — AB
Influenza B by PCR: NEGATIVE
Resp Syncytial Virus by PCR: NEGATIVE
SARS Coronavirus 2 by RT PCR: NEGATIVE

## 2021-12-19 MED ORDER — BENZONATATE 100 MG PO CAPS: ORAL_CAPSULE | ORAL | 0 refills | Status: DC

## 2021-12-19 NOTE — Discharge Instructions (Signed)
You have been diagnosed with a viral upper respiratory infection based on your symptoms and exam. Viral illnesses cannot be treated with antibiotics - they are self limiting - and you should find your symptoms resolving within a few days. Get plenty of rest and non-caffeinated fluids.  We have performed a respiratory swab checking for COVID, and influenza.  If the results of this testing are positive, someone will call you if you are eligible for any antiviral treatment.    We recommend you use over-the-counter medications for symptom control including Tylenol or ibuprofen for fever, chills or body aches, and cold/cough medication.  Saline mist spray is helpful for removing excess mucus from your nose.  Room humidifiers are helpful to ease breathing at night. You might also find relief of nasal/sinus congestion symptoms by using a nasal decongestant such as Sudafed sinus (pseudoephedrine).  You will need to obtain this medication from behind the pharmacist counter.  Speak to the pharmacist to verify that you are not duplicating medications with other over-the-counter formulations that you may be using.   Follow up here or with your primary care provider if your symptoms are worsening or not improving.

## 2021-12-19 NOTE — ED Notes (Signed)
Triaged by provider  

## 2021-12-19 NOTE — ED Provider Notes (Addendum)
Kelly Munoz    CSN: [id number] Arrival date & time: 12/19/21  1223      History   Chief Complaint No chief complaint on file.   HPI Kelly Munoz is a 61 y.o. female.   HPI  Presents to urgent care with complaint of cough and headache since Thursday (3 days) when she was on a cruise.  She complains of cough that wakes her up at night but does not keep her awake.  She does not use medicine to calm the cough.  Cough is described as "croupy" by which she means it is hacking and constant and causing pain in her upper back.  She also endorses headache in her forehead that she thinks is related to sinus congestion.  She is concerned about flu, COVID, RSV.  She denies any comorbidities that put her at additional risk for RSV.  Past Medical History:  Diagnosis Date   Abnormal LFTs (liver function tests) 02/10/2017   Anal fissure 07/07/2017   Breast cancer ([place])    Breast cancer, right ([place]) 06/08/2011   Family history of breast cancer    Family history of colon cancer    Family history of prostate cancer    History of IBS    History of kidney stones    Hypertension    Lymphocytosis 08/19/2019   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([place])    Parkinson disease ([place]) 2020   Personal history of chemotherapy 2013   RIGHT lumpectomy   Personal history of radiation therapy 2013   RIGHT lumpectomy    Patient Active Problem List   Diagnosis Date Noted   Indeterminate colitis    Goals of care, counseling/discussion 08/19/2019   History of fatty infiltration of liver 09/18/2018   Parkinsonism 09/18/2018   Tremor 08/10/2017   Hypercalcemia 02/15/2017   Intermittent vertigo 74/25/9563   Nonalcoholic steatohepatitis ([facility]) 11/01/2016   Osteopenia 02/14/2016   Family history of breast cancer    Family history of colon cancer    Family history of prostate cancer    History of breast cancer 09/30/2015   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 09/30/2015   Crohn's disease ([place])  09/30/2015   Mixed hyperlipidemia 09/30/2015   Cancer of right breast with BRCA2 gene mutation ([place]) 09/09/2015   Benign essential hypertension 08/27/2014   Disequilibrium 08/27/2014   Breast cancer, right ([place]) 06/08/2011    Past Surgical History:  Procedure Laterality Date   ABDOMINAL HYSTERECTOMY     BREAST BIOPSY Right 2013   lumpectomy chem and rad   BREAST LUMPECTOMY Right 2013   COLONOSCOPY WITH PROPOFOL N/A 02/06/2017   Procedure: COLONOSCOPY WITH PROPOFOL;  Surgeon: Lollie Sails, MD;  Location: [facility] ENDOSCOPY;  Service: Endoscopy;  Laterality: N/A;   COLONOSCOPY WITH PROPOFOL N/A 08/02/2021   Procedure: COLONOSCOPY WITH PROPOFOL;  Surgeon: Lin Landsman, MD;  Location[facility] ENDOSCOPY;  Service: Gastroenterology;  Laterality: N/A;   CYSTOSCOPY N/A 11/09/2015   Procedure: CYSTOSCOPY;  Surgeon: Honor Loh Ward, MD;  Location: [facility] ORS;  Service: Gynecology;  Laterality: N/A;   parotid stone      Removal   parotid stones Left    REDUCTION MAMMAPLASTY Bilateral 02/2013   TONSILLECTOMY      OB History   No obstetric history on file.      Home Medications    Prior to Admission medications   Medication Sig Start Date End Date Taking? Authorizing Provider  carbidopa-levodopa (SINEMET IR) 25-250 MG tablet  08/06/18   [provider]  Cholecalciferol (VITAMIN D3) 5000 units CAPS Take 5,000 Units by mouth daily.    [provider]  COVID-19 mRNA bivalent vaccine, Pfizer, (PFIZER COVID-19 VAC BIVALENT) injection Inject into the muscle. Patient not taking: Reported on 08/19/2021 04/02/21   Carlyle Basques, MD  FISH OIL-KRILL OIL PO Take 1 tablet by mouth daily.    [provider]  fluticasone (FLONASE) 50 MCG/ACT nasal spray Place 1-2 sprays into both nostrils daily as needed for allergies or rhinitis. Patient not taking: Reported on 08/19/2021    [provider]  lidocaine (LIDODERM) 5 % 1 patch daily. Patient not taking: Reported on  08/19/2021 04/16/21   [provider]  mesalamine (LIALDA) 1.2 g EC tablet TAKE 2 TABLETS BY MOUTH DAILY  WITH BREAKFAST 11/29/21   Vanga, Tally Due, MD  methocarbamol (ROBAXIN) 500 MG tablet Take 500 mg by mouth 3 (three) times daily as needed. Patient not taking: Reported on 08/19/2021 04/16/21   [provider]  metoprolol succinate (TOPROL-XL) 25 MG 24 hr tablet Take 25 mg by mouth daily.    [provider]  tamoxifen (NOLVADEX) 20 MG tablet Take 1 tablet by mouth daily. 07/15/15   [provider]  tretinoin (RETIN-A) 0.05 % cream APPLY A PEA-SIZED AMOUNT TO FACE EVERY NIGHT AS DIRECTED 01/25/21   [provider]    Family History Family History  Problem Relation Age of Onset   Breast cancer Mother 27   Kidney cancer Father        dx in his mid 2s   Prostate cancer Maternal Uncle        dx in mid 34s   Stomach cancer Paternal 33        dx in mid 66s   Diabetes Maternal Grandmother    Heart disease Maternal Grandmother    Prostate cancer Maternal Grandfather    Heart disease Paternal Grandmother    Heart attack Paternal Grandfather    Prostate cancer Maternal Uncle        dx in mid 57s   Prostate cancer Maternal Uncle        dx in mid 71s   Colon cancer Maternal Uncle        dx in mid 62s   Colon cancer Cousin        paternal first cousin dx in his mid 40s   Colon cancer Cousin        paternal first cousin dx in his mid 29s    Social History Social History   Tobacco Use   Smoking status: Never   Smokeless tobacco: Never  Vaping Use   Vaping Use: Never used  Substance Use Topics   Alcohol use: No   Drug use: No     Allergies   Cat hair extract, Zithromax [azithromycin], Clindamycin/lincomycin, and Erythromycin   Review of Systems Review of Systems   Physical Exam Triage Vital Signs ED Triage Vitals [12/19/21 1245]  Enc Vitals Group     BP 134/85     Pulse Rate (!) 103     Resp 18     Temp 99.2 F (37.3 C)      Temp Source Oral     SpO2 94 %     Weight      Height      Head Circumference      Peak Flow      Pain Score      Pain Loc      Pain Edu?  Excl. in [place]?    No data found.  Updated Vital Signs BP 134/85 (BP Location: Left Arm)   Pulse (!) 103   Temp 99.2 F (37.3 C) (Oral)   Resp 18   SpO2 94%   Visual Acuity Right Eye Distance:   Left Eye Distance:   Bilateral Distance:    Right Eye Near:   Left Eye Near:    Bilateral Near:     Physical Exam Vitals reviewed.  Constitutional:      Appearance: Normal appearance. She is ill-appearing.  Cardiovascular:     Rate and Rhythm: Normal rate. Rhythm irregular.     Comments: Possibly split s1,s2 Pulmonary:     Effort: Pulmonary effort is normal.     Breath sounds: Normal breath sounds. No wheezing, rhonchi or rales.  Skin:    General: Skin is warm and dry.  Neurological:     General: No focal deficit present.     Mental Status: She is alert and oriented to person, place, and time.  Psychiatric:        Mood and Affect: Mood normal.        Behavior: Behavior normal.      UC Treatments / Results  Labs (all labs ordered are listed, but only abnormal results are displayed) Labs Reviewed - No data to display  EKG   Radiology No results found.  Procedures Procedures (including critical care time)  Medications Ordered in UC Medications - No data to display  Initial Impression / Assessment and Plan / UC Course  I have reviewed the triage vital signs and the nursing notes.  Pertinent labs & imaging results that were available during my care of the patient were reviewed by me and considered in my medical decision making (see chart for details).   Patient is afebrile here without recent antipyretics. Satting well on room air. Overall is ill appearing, though well hydrated and without respiratory distress. Pulmonary exam is unremarkable.  Lungs CTAB without wheezes rhonchi or rales.  Suspect viral URI with  cough and recommended use of OTC medication for symptom control.  Will prescribe benzonatate to help her control the cough.  Respiratory swab was obtained and results are pending.  Final Clinical Impressions(s) / UC Diagnoses   Final diagnoses:  None   Discharge Instructions   None    ED Prescriptions   None    PDMP not reviewed this encounter.   Rose Phi, [place] 12/19/21 1314    ImmordinoAnnie Main, [place] 12/19/21 1317

## 2022-01-15 ENCOUNTER — Encounter: Payer: Self-pay | Admitting: Oncology

## 2022-01-18 ENCOUNTER — Other Ambulatory Visit: Payer: Self-pay

## 2022-01-20 ENCOUNTER — Encounter: Payer: Self-pay | Admitting: Oncology

## 2022-01-24 ENCOUNTER — Ambulatory Visit
Admission: RE | Admit: 2022-01-24 | Discharge: 2022-01-24 | Disposition: A | Discharge status: Home / Self Care | Payer: BLUE CROSS/BLUE SHIELD | Source: Ambulatory Visit | Attending: Oncology | Admitting: Oncology

## 2022-01-24 DIAGNOSIS — C50911 Malignant neoplasm of unspecified site of right female breast: Secondary | ICD-10-CM | POA: Diagnosis present

## 2022-01-24 DIAGNOSIS — Z170 Estrogen receptor positive status [ER+]: Secondary | ICD-10-CM | POA: Insufficient documentation

## 2022-01-24 MED ORDER — GADOBUTROL 1 MMOL/ML IV SOLN
7.0000 mL | Freq: Once | INTRAVENOUS | Status: AC | PRN
  Administered 2022-01-24: 7 mL via INTRAVENOUS

## 2022-01-27 ENCOUNTER — Inpatient Hospital Stay: Payer: BLUE CROSS/BLUE SHIELD | Attending: Oncology | Admitting: Oncology

## 2022-01-27 ENCOUNTER — Encounter: Payer: Self-pay | Admitting: Oncology

## 2022-01-27 ENCOUNTER — Inpatient Hospital Stay: Payer: BLUE CROSS/BLUE SHIELD

## 2022-01-27 VITALS — BP 132/80 | HR 98 | Temp 98.1°F | Wt 154.6 lb

## 2022-01-27 DIAGNOSIS — K7581 Nonalcoholic steatohepatitis (NASH): Secondary | ICD-10-CM

## 2022-01-27 DIAGNOSIS — Z9221 Personal history of antineoplastic chemotherapy: Secondary | ICD-10-CM | POA: Diagnosis not present

## 2022-01-27 DIAGNOSIS — I10 Essential (primary) hypertension: Secondary | ICD-10-CM | POA: Diagnosis not present

## 2022-01-27 DIAGNOSIS — Z90710 Acquired absence of both cervix and uterus: Secondary | ICD-10-CM | POA: Diagnosis not present

## 2022-01-27 DIAGNOSIS — Z79899 Other long term (current) drug therapy: Secondary | ICD-10-CM | POA: Diagnosis not present

## 2022-01-27 DIAGNOSIS — Z800 Family history of malignant neoplasm of digestive organs: Secondary | ICD-10-CM

## 2022-01-27 DIAGNOSIS — C50911 Malignant neoplasm of unspecified site of right female breast: Secondary | ICD-10-CM | POA: Insufficient documentation

## 2022-01-27 DIAGNOSIS — Z9079 Acquired absence of other genital organ(s): Secondary | ICD-10-CM | POA: Diagnosis not present

## 2022-01-27 DIAGNOSIS — M8580 Other specified disorders of bone density and structure, unspecified site: Secondary | ICD-10-CM | POA: Diagnosis not present

## 2022-01-27 DIAGNOSIS — Z90722 Acquired absence of ovaries, bilateral: Secondary | ICD-10-CM | POA: Diagnosis not present

## 2022-01-27 DIAGNOSIS — Z79810 Long term (current) use of selective estrogen receptor modulators (SERMs): Secondary | ICD-10-CM | POA: Insufficient documentation

## 2022-01-27 DIAGNOSIS — Z170 Estrogen receptor positive status [ER+]: Secondary | ICD-10-CM | POA: Diagnosis not present

## 2022-01-27 DIAGNOSIS — Z1509 Genetic susceptibility to other malignant neoplasm: Secondary | ICD-10-CM | POA: Diagnosis not present

## 2022-01-27 DIAGNOSIS — Z1501 Genetic susceptibility to malignant neoplasm of breast: Secondary | ICD-10-CM | POA: Diagnosis not present

## 2022-01-27 LAB — BASIC METABOLIC PANEL
Anion gap: 7 (ref 5–15)
BUN: 12 mg/dL (ref 6–20)
CO2: 28 mmol/L (ref 22–32)
Calcium: 9.5 mg/dL (ref 8.9–10.3)
Chloride: 104 mmol/L (ref 98–111)
Creatinine, Ser: 0.56 mg/dL (ref 0.44–1.00)
GFR, Estimated: >60 mL/min (ref >60)
Glucose, Bld: 112 mg/dL — ABNORMAL HIGH (ref 70–99)
Potassium: 3.4 mmol/L — ABNORMAL LOW (ref 3.5–5.1)
Sodium: 139 mmol/L (ref 135–145)

## 2022-01-27 NOTE — Assessment & Plan Note (Addendum)
04/20/2020 DEXA right femur T score -1.7 Continue calcium and vitamin D supplementation.

## 2022-01-27 NOTE — Progress Notes (Signed)
Hematology/Oncology Progress note [phone] [phone]       Chief Complaint: Kelly Munoz is a 62 y.o. female with stage IIB Her2/neu + right breast cancer and a BRCA2 mutation who is seen for 6 month assessment.  ASSESSMENT & PLAN:   Cancer Staging  Breast cancer, right [facility]) Staging form: Breast, AJCC 7th Edition - Clinical stage from 08/11/2015: Stage IIB (T2, N1, M0) - Unsigned   Breast cancer, right (HCC) History of Stage IIB Triple positive breast cancer - 05/2011 S/p lumpectomy followed by adjvuant chemotherapy TCH x 6 followed by adjuvant RT, finished 1 year of transtuzumab.  On tamoxifen since 01/2012, Patient is postmenopausal, she prefers to stay on tamoxifen instead of switching to aromatase inhibitor. She also prefers to continue treatment beyond 10 years.  We discussed about the option of bilateral prophylactic mastectomy as primary prevention.  She is undecided due to her mother's mastectomy experience. She is interested in discussing with surgery. Refer to surgery  Labs from [facility] were reviewed.  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  Osteopenia 04/20/2020 DEXA right femur T score -1.7 Continue calcium and vitamin D supplementation.  Family history of pancreatic cancer 09/16/2021 annual MRI MRCP negative for malignancy CA 19.9 elevated slightly. Repeat CA 19.9  Her father was recently diagnosed with pancreatic cancer [although father is negative for BRCA2 mutation] She is interested in EUS screening. Previously seen by [facility] Dr.Jowell, will refer her back to further discuss.  Nonalcoholic steatohepatitis (NASH) Chronically elevated LFT, monitor.  Previous liver biopsy on 11/05/2019 revealed benign hepatic parenchyma with moderate macrovesicular steatosis, mild portal inflammation with focal lobular activity, and focal ballooning degeneration. There was no evidence of  malignancy.  Orders Placed This Encounter  Procedures   MM 3D SCREEN BREAST BILATERAL    Standing Status:   Future    Standing Expiration Date:   01/28/2023    Order Specific Question:   Reason for Exam (SYMPTOM  OR DIAGNOSIS REQUIRED)    Answer:   BRCA carrier    Order Specific Question:   Preferred imaging location?    Answer:   [facility]    Order Specific Question:   Is the patient pregnant?    Answer:   No   Cancer antigen 19-9    Standing Status:   Future    Number of Occurrences:   1    Standing Expiration Date:   8/50/2774   Basic metabolic panel    Standing Status:   Future    Number of Occurrences:   1    Standing Expiration Date:   01/27/2023   Ambulatory referral to General Surgery    Referral Priority:   Routine    Referral Type:   Surgical    Referral Reason:   Specialty Services Required    Referred to Provider:   Herbert Pun, MD    Requested Specialty:   General Surgery    Number of Visits Requested:   1   Ambulatory referral to Gastroenterology    Referral Priority:   Routine    Referral Type:   Consultation    Referral Reason:   Specialty Services Required    Referred to Provider:   Jola Schmidt, MD    Number of Visits Requested:   1   Follow up TBD All questions were answered. The patient knows to call the clinic with any problems, questions or concerns.  Talbert Cage  Tasia Catchings, MD, PhD [facility] Hematology Oncology 01/27/2022    PERTINENT ONCOLOGY HISTORY Patient previously followed up by Dr.Corcoran, patient switched care to me on 08/27/20 Extensive medical record review was performed by me  06/08/2011. 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  There was solid, cribriform and comedo DCIS with necrosis.  One of 5 lymph nodes were positive.  There was a macro-metastasis in one  intramammary lymph node.  Tumor was ER positive (88.8%), PR positive (90.93%), and Her2/neu 3+.  Ki-67 was 48%.   Pathologic stage was T2N1aM0.    Adjuvant chemotherapy  6 cycles of TCH chemotherapy beginning 07/11/2011.  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  She completed 1 year of adjuvant Herceptin.  per Dr.Corcoran's note  her EF decreased (no records available).  EF returned to normal per patient report (last echo was in 2014).   Adjuvant radiation (completed before the end of 2013).     01/2012 Tamoxifen   My Risk Genetic testing on 07/13/2015 revealed a BRCA2 mutation ([id number].5946del). 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   Patient has been on breast MRI alternate with mammogram every 6 months.   # she had TAH- BSO  # Patient follows up with gastroenterology, and had colonoscopy and EGD in 2019.  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 Lacinda Axon.  # She was diagnosed with early stage Parkinson's disease.  Tremors have improved on Sinemet.  Paraneoplastic antibody panel was negative on 08/11/2017.  She has been followed by Dr. Melrose Nakayama, neurologist.  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  # She has a history of lymphocytosis.  Flow cytometry on 08/05/2019 revealed no diagnostic immunophenotypic abnormality. There was absolute lymphocytosis. B-cell clonality was unable to be performed secondary to nonspecific light chain binding. Consider B cell gene rearrangement studies.  08/16/2021, bilateral screening mammogram negative for malignancy.    INTERVAL HISTORY Kelly Munoz is a 62 y.o. female who has above history reviewed by me today presents for follow up visit for history of breast cancer.  Patient takes tamoxifen 20 mg daily.  Patient tolerates well with manageable side effect No new breast concerns.   Patient's father was recently diagnosed with pancreatic cancer.  She is concerned and has requested a CA 19-9 to be tested.   Review of Systems  Constitutional:  Negative for appetite change, chills, fatigue and fever.  HENT:   Negative for hearing loss and voice change.   Eyes:  Negative for eye problems.  Respiratory:  Negative for chest tightness and cough.   Cardiovascular:  Negative for chest pain.  Gastrointestinal:  Negative for abdominal distention, abdominal pain and blood in stool.  Endocrine: Positive for hot flashes.  Genitourinary:  Negative for difficulty urinating and frequency.   Musculoskeletal:  Positive for arthralgias.  Skin:  Negative for itching and rash.  Neurological:  Negative for extremity weakness.  Hematological:  Negative for adenopathy.  Psychiatric/Behavioral:  Negative for confusion.     Past Medical History:  Diagnosis Date   Abnormal LFTs (liver function tests) 02/10/2017   Anal fissure 07/07/2017   Breast cancer ([place])    Breast cancer, right ([place]) 06/08/2011   Family history of  breast cancer    Family history of colon cancer    Family history of prostate cancer    History of IBS    History of kidney stones    Hypertension    Lymphocytosis 08/19/2019   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([place])    Parkinson disease 2020   Personal history of chemotherapy 2013   RIGHT lumpectomy   Personal history of radiation therapy 2013   RIGHT lumpectomy    Past Surgical History:  Procedure Laterality Date   ABDOMINAL HYSTERECTOMY     BREAST BIOPSY Right 2013   lumpectomy chem and rad   BREAST LUMPECTOMY Right 2013   COLONOSCOPY WITH PROPOFOL N/A 02/06/2017   Procedure: COLONOSCOPY WITH PROPOFOL;  Surgeon: Lollie Sails, MD;  Location: [facility] ENDOSCOPY;  Service: Endoscopy;  Laterality: N/A;   COLONOSCOPY WITH PROPOFOL N/A 08/02/2021   Procedure: COLONOSCOPY WITH PROPOFOL;  Surgeon: Lin Landsman, MD;  Location: [facility] ENDOSCOPY;  Service:  Gastroenterology;  Laterality: N/A;   CYSTOSCOPY N/A 11/09/2015   Procedure: CYSTOSCOPY;  Surgeon: Honor Loh Ward, MD;  Location: [facility] ORS;  Service: Gynecology;  Laterality: N/A;   parotid stone      Removal   parotid stones Left    REDUCTION MAMMAPLASTY Bilateral 02/2013   TONSILLECTOMY      Family History  Problem Relation Age of Onset   Breast cancer Mother 71   Kidney cancer Father        dx in his mid 65s   Prostate cancer Maternal Uncle        dx in mid 72s   Stomach cancer Paternal Aunt        dx in mid 30s   Diabetes Maternal Grandmother    Heart disease Maternal Grandmother    Prostate cancer Maternal Grandfather    Heart disease Paternal Grandmother    Heart attack Paternal Grandfather    Prostate cancer Maternal Uncle        dx in mid 50s   Prostate cancer Maternal Uncle        dx in mid 79s   Colon cancer Maternal Uncle        dx in mid 10s   Colon cancer Cousin        paternal first cousin dx in his mid 10s   Colon cancer Cousin        paternal first cousin dx in his mid 2s    Social History:  reports that she has never smoked. She has never used smokeless tobacco. She reports that she does not drink alcohol and does not use drugs. She is from [place].  She moved to [place] at the end of 03/2015 to be with her family.  Her parents live in [place].  She lives with her husband.  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    Allergies:  Allergies  Allergen Reactions   Cat Hair Extract Other (See Comments)    Sneezing,watery eyes, throat closes   Zithromax [Azithromycin] Diarrhea   Clindamycin/Lincomycin Rash   Erythromycin Rash    Current Medications: Current Outpatient Medications  Medication Sig Dispense Refill   carbidopa-levodopa (SINEMET IR) 25-250 MG tablet      Cholecalciferol (VITAMIN D3) 5000 units CAPS Take 5,000 Units by mouth daily.     FISH OIL-KRILL OIL PO Take 1 tablet by mouth daily.     mesalamine (LIALDA) 1.2 g EC  tablet TAKE 2 TABLETS BY MOUTH DAILY  WITH BREAKFAST 180 tablet  0   metoprolol succinate (TOPROL-XL) 25 MG 24 hr tablet Take 25 mg by mouth daily.     tamoxifen (NOLVADEX) 20 MG tablet Take 1 tablet by mouth daily.     benzonatate (TESSALON) 100 MG capsule Take 1-2 tablets 3 times a day as needed for cough (Patient not taking: Reported on 01/27/2022) 30 capsule 0   COVID-19 mRNA bivalent vaccine, Pfizer, (PFIZER COVID-19 VAC BIVALENT) injection Inject into the muscle. (Patient not taking: Reported on 08/19/2021) 0.3 mL 0   fluticasone (FLONASE) 50 MCG/ACT nasal spray Place 1-2 sprays into both nostrils daily as needed for allergies or rhinitis. (Patient not taking: Reported on 08/19/2021)     lidocaine (LIDODERM) 5 % 1 patch daily. (Patient not taking: Reported on 08/19/2021)     tretinoin (RETIN-A) 0.05 % cream APPLY A PEA-SIZED AMOUNT TO FACE EVERY NIGHT AS DIRECTED (Patient not taking: Reported on 01/27/2022)     No current facility-administered medications for this visit.     Performance status (ECOG):  0-1  Vitals Blood pressure 132/80, pulse 98, temperature 98.1 F (36.7 C), temperature source Tympanic, weight 154 lb 9.6 oz (70.1 kg), SpO2 100 %.   Physical Exam Constitutional:      General: She is not in acute distress.    Appearance: She is not diaphoretic.  Eyes:     General: No scleral icterus. Cardiovascular:     Rate and Rhythm: Normal rate.     Heart sounds: No murmur heard. Pulmonary:     Effort: Pulmonary effort is normal. No respiratory distress.     Breath sounds: No wheezing.  Abdominal:     General: Bowel sounds are normal. There is no distension.     Palpations: Abdomen is soft.     Tenderness: There is no abdominal tenderness.  Musculoskeletal:        General: Normal range of motion.     Cervical back: Normal range of motion.  Skin:    General: Skin is warm and dry.  Neurological:     Mental Status: She is alert and oriented to person, place, and time. Mental  status is at baseline.     Cranial Nerves: No cranial nerve deficit.     Motor: No abnormal muscle tone.  Psychiatric:        Mood and Affect: Mood and affect normal.     RADIOGRAPHIC STUDIES: I have personally reviewed the radiological images as listed and agreed with the findings in the report. MR BREAST BILATERAL W WO CONTRAST INC CAD  Result Date: 01/24/2022 CLINICAL DATA:  62 year old female with history of right breast cancer in 2013. Status post lumpectomy, radiation therapy and chemotherapy. BRCA positive. EXAM: BILATERAL BREAST MRI WITH AND WITHOUT CONTRAST TECHNIQUE: Multiplanar, multisequence MR images of both breasts were obtained prior to and following the intravenous administration of 7 ml of Gadavist Three-dimensional MR images were rendered by post-processing of the original MR data on an independent workstation. The three-dimensional MR images were interpreted, and findings are reported in the following complete MRI report for this study. Three dimensional images were evaluated at the independent interpreting workstation using the DynaCAD thin client. COMPARISON:  Previous exam(s). FINDINGS: Breast composition: b. Scattered fibroglandular tissue. Background parenchymal enhancement: Moderate. Right breast: Lumpectomy changes in the right breast. No mass or abnormal enhancement. Left breast: No mass or abnormal enhancement. Lymph nodes: No abnormal appearing lymph nodes. Ancillary findings:  None. IMPRESSION: No abnormal enhancement in either breast. RECOMMENDATION: Bilateral screening mammogram in August of 2024  is recommended. Breast MRI in 1 year is recommended. The American Cancer Society recommends annual MRI and mammography in patients with an estimated lifetime risk of developing breast cancer greater than 20 - 25%, or who are known or suspected to be positive for the breast cancer gene. BI-RADS CATEGORY  2: Benign. Electronically Signed   By: Lillia Mountain M.D.   On: 01/24/2022 14:41    Laboratory Studies.  Patient gets blood work done at [facility].  Results are reviewed and scanned into EMR

## 2022-01-27 NOTE — Assessment & Plan Note (Signed)
Chronically elevated LFT, monitor.  Previous liver biopsy on 11/05/2019 revealed benign hepatic parenchyma with moderate macrovesicular steatosis, mild portal inflammation with focal lobular activity, and focal ballooning degeneration. There was no evidence of malignancy.

## 2022-01-27 NOTE — Assessment & Plan Note (Addendum)
#  BRCA2 +  S/p TAH -BSO- 11/09/2015  Stable normal CA125.

## 2022-01-27 NOTE — Assessment & Plan Note (Signed)
Repeat BMP today showed normal calcium level.

## 2022-01-27 NOTE — Assessment & Plan Note (Addendum)
History of Stage IIB Triple positive breast cancer - 05/2011 S/p lumpectomy followed by adjvuant chemotherapy TCH x 6 followed by adjuvant RT, finished 1 year of transtuzumab.  On tamoxifen since 01/2012, Patient is postmenopausal, she prefers to stay on tamoxifen instead of switching to aromatase inhibitor. She also prefers to continue treatment beyond 10 years.  Currently she is on active surveillance with annual MRI breast and annual mammogram.  We discussed about the option of bilateral prophylactic mastectomy as primary prevention.  She is undecided due to her mother's mastectomy experience. She is interested in discussing with surgery. Refer to surgery  Labs from [facility] were reviewed.  Stable CA 27-29

## 2022-01-27 NOTE — Assessment & Plan Note (Signed)
09/16/2021 annual MRI MRCP negative for malignancy CA 19.9 elevated slightly. Repeat CA 19.9  Her father was recently diagnosed with pancreatic cancer [although father is negative for BRCA2 mutation] She is interested in EUS screening. Previously seen by [facility] Dr.Jowell, will refer her back to further discuss.

## 2022-01-27 NOTE — Addendum Note (Signed)
Addended by: Earlie Server on: 01/27/2022 08:33 PM   Modules accepted: Orders

## 2022-01-28 ENCOUNTER — Telehealth: Payer: Self-pay

## 2022-01-28 LAB — CANCER ANTIGEN 19-9: CA 19-9: 35 U/mL (ref 0–35)

## 2022-01-28 NOTE — Telephone Encounter (Signed)
Referral faxed to [facility] (Dr. Francella Solian) for pt to re-establish care for BRCA mutation positive and family history of pancreatic cancer.

## 2022-01-28 NOTE — Telephone Encounter (Signed)
Referral faxed to Dr. Deniece Ree office foer :  BRCA mutation positive and eval prophylactic bilateral mastectomy.

## 2022-01-30 ENCOUNTER — Other Ambulatory Visit: Payer: Self-pay | Admitting: Oncology

## 2022-01-30 DIAGNOSIS — Z1501 Genetic susceptibility to malignant neoplasm of breast: Secondary | ICD-10-CM

## 2022-01-30 DIAGNOSIS — C50911 Malignant neoplasm of unspecified site of right female breast: Secondary | ICD-10-CM

## 2022-01-31 ENCOUNTER — Telehealth: Payer: Self-pay

## 2022-01-31 ENCOUNTER — Encounter: Payer: Self-pay | Admitting: Oncology

## 2022-01-31 DIAGNOSIS — C50911 Malignant neoplasm of unspecified site of right female breast: Secondary | ICD-10-CM

## 2022-01-31 NOTE — Telephone Encounter (Signed)
-----  Message from Earlie Server, MD sent at 01/30/2022 10:29 PM EST ----- Please arrange her follow up plan as follows.  - labs in August, cbc cmp CA125, CA19.9, CA 27.29 1 week prior to MD visit.

## 2022-01-31 NOTE — Telephone Encounter (Signed)
Dr. Tasia Catchings has sent mychart message to pt regarding follow up plan.   Please schedule and inform pt of appts.   Labs in August (cbc,cmp, ca125, ca19.9, ca27.29)  MD (1 week after labs Augh)

## 2022-02-01 ENCOUNTER — Encounter: Payer: Self-pay | Admitting: Gastroenterology

## 2022-02-01 ENCOUNTER — Other Ambulatory Visit: Payer: Self-pay | Admitting: Gastroenterology

## 2022-02-01 NOTE — Telephone Encounter (Signed)
Please advise what strength and what medication

## 2022-02-02 MED ORDER — MESALAMINE 800 MG PO TBEC: 800.0000 mg | DELAYED_RELEASE_TABLET | Freq: Two times a day (BID) | ORAL | 1 refills | Status: DC

## 2022-02-03 ENCOUNTER — Other Ambulatory Visit: Payer: Self-pay

## 2022-02-03 MED ORDER — MESALAMINE ER 0.375 G PO CP24: 375.0000 mg | ORAL_CAPSULE | Freq: Two times a day (BID) | ORAL | 0 refills | Status: DC

## 2022-03-15 ENCOUNTER — Encounter: Payer: Self-pay | Admitting: Plastic Surgery

## 2022-03-15 ENCOUNTER — Ambulatory Visit: Payer: BLUE CROSS/BLUE SHIELD | Admitting: Plastic Surgery

## 2022-03-15 VITALS — BP 132/88 | HR 87 | Ht 62.5 in | Wt 155.0 lb

## 2022-03-15 DIAGNOSIS — Z1509 Genetic susceptibility to other malignant neoplasm: Secondary | ICD-10-CM | POA: Diagnosis not present

## 2022-03-15 DIAGNOSIS — C50911 Malignant neoplasm of unspecified site of right female breast: Secondary | ICD-10-CM

## 2022-03-15 DIAGNOSIS — Z803 Family history of malignant neoplasm of breast: Secondary | ICD-10-CM

## 2022-03-15 DIAGNOSIS — M8580 Other specified disorders of bone density and structure, unspecified site: Secondary | ICD-10-CM | POA: Diagnosis not present

## 2022-03-15 DIAGNOSIS — Z170 Estrogen receptor positive status [ER+]: Secondary | ICD-10-CM

## 2022-03-15 DIAGNOSIS — K50919 Crohn's disease, unspecified, with unspecified complications: Secondary | ICD-10-CM

## 2022-03-15 DIAGNOSIS — Z1501 Genetic susceptibility to malignant neoplasm of breast: Secondary | ICD-10-CM

## 2022-03-15 NOTE — Addendum Note (Signed)
Addended by: Harl Bowie on: 03/15/2022 04:52 PM   Modules accepted: Orders

## 2022-03-15 NOTE — Progress Notes (Addendum)
Patient ID: Kelly Munoz, female    DOB: April 02, 1960, 62 y.o.   MRN: [id number]   Chief Complaint  Patient presents with   Consult   Breast Problem    The patient is a 62 year old female here with her husband for evaluation for breast reconstruction.  In 2013 the patient was diagnosed with right-sided breast cancer.  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  She had a right partial mastectomy/lumpectomy and radiation.  She then had bilateral breast reduction.  In 2017 she had genetic testing which showed she was BRCA positive.  She has a family history of breast cancer in her mother.  She is not a smoker and does not have diabetes.  Her recent mammogram in August was negative and MRI in January was negative.  Her past medical history is present for hypertension and ulcerative bowel condition as well as possible Parkinson's disease.  She has had a hysterectomy in the past.  She has the typical scars for a breast reduction/Mastopexy.      Review of Systems  Constitutional: Negative.   HENT: Negative.    Eyes: Negative.   Respiratory: Negative.    Cardiovascular: Negative.   Gastrointestinal: Negative.   Endocrine: Negative.   Genitourinary: Negative.   Musculoskeletal: Negative.   Hematological: Negative.     Past Medical History:  Diagnosis Date   Abnormal LFTs (liver function tests) 02/10/2017   Anal fissure 07/07/2017   Breast cancer (HCC)    Breast cancer, right (HCC) 06/08/2011   Family history of breast cancer    Family history of colon cancer    Family history of prostate cancer    History of IBS    History of kidney stones    Hypertension    Lymphocytosis 08/19/2019   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(HCC)    Parkinson disease 2020   Personal history of chemotherapy 2013   RIGHT lumpectomy   Personal history of radiation therapy 2013   RIGHT lumpectomy    Past Surgical History:  Procedure Laterality Date   ABDOMINAL HYSTERECTOMY     BREAST BIOPSY  Right 2013   lumpectomy chem and rad   BREAST LUMPECTOMY Right 2013   COLONOSCOPY WITH PROPOFOL N/A 02/06/2017   Procedure: COLONOSCOPY WITH PROPOFOL;  Surgeon: Christena Deem, MD;  Location: [facility] ENDOSCOPY;  Service: Endoscopy;  Laterality: N/A;   COLONOSCOPY WITH PROPOFOL N/A 08/02/2021   Procedure: COLONOSCOPY WITH PROPOFOL;  Surgeon: Toney Reil, MD;  Location: [facility] ENDOSCOPY;  Service: Gastroenterology;  Laterality: N/A;   CYSTOSCOPY N/A 11/09/2015   Procedure: CYSTOSCOPY;  Surgeon: Elenora Fender Ward, MD;  Location: [facility] ORS;  Service: Gynecology;  Laterality: N/A;   parotid stone      Removal   parotid stones Left    REDUCTION MAMMAPLASTY Bilateral 02/2013   TONSILLECTOMY        Current Outpatient Medications:    carbidopa-levodopa (SINEMET IR) 25-250 MG tablet, , Disp: , Rfl:    Cholecalciferol (VITAMIN D3) 5000 units CAPS, Take 5,000 Units by mouth daily., Disp: , Rfl:    COVID-19 mRNA bivalent vaccine, Pfizer, (PFIZER COVID-19 VAC BIVALENT) injection, Inject into the muscle. (Patient not taking: Reported on 08/19/2021), Disp: 0.3 mL, Rfl: 0   FISH OIL-KRILL OIL PO, Take 1 tablet by mouth daily., Disp: , Rfl:    fluticasone (FLONASE) 50 MCG/ACT nasal spray, Place 1-2 sprays into both nostrils daily as needed for allergies or rhinitis. (Patient not taking: Reported on 08/19/2021), Disp: , Rfl:  lidocaine (LIDODERM) 5 %, 1 patch daily. (Patient not taking: Reported on 08/19/2021), Disp: , Rfl:    mesalamine (APRISO) 0.375 g 24 hr capsule, Take 1 capsule (0.375 g total) by mouth in the morning and at bedtime., Disp: 180 capsule, Rfl: 0   metoprolol succinate (TOPROL-XL) 25 MG 24 hr tablet, Take 25 mg by mouth daily., Disp: , Rfl:    tamoxifen (NOLVADEX) 20 MG tablet, Take 1 tablet by mouth daily., Disp: , Rfl:    tretinoin (RETIN-A) 0.05 % cream, APPLY A PEA-SIZED AMOUNT TO FACE EVERY NIGHT AS DIRECTED (Patient not taking: Reported on 01/27/2022), Disp: , Rfl:    Objective:    Vitals:   03/15/22 1255  BP: 132/88  Pulse: 87  SpO2: 99%    Physical Exam Vitals and nursing note reviewed.  Constitutional:      Appearance: Normal appearance.  HENT:     Head: Normocephalic.  Cardiovascular:     Rate and Rhythm: Normal rate.     Pulses: Normal pulses.  Pulmonary:     Effort: Pulmonary effort is normal.  Abdominal:     Palpations: Abdomen is soft.  Musculoskeletal:        General: No swelling or deformity.  Skin:    General: Skin is warm.     Capillary Refill: Capillary refill takes less than 2 seconds.  Neurological:     Mental Status: She is alert and oriented to person, place, and time.  Psychiatric:        Mood and Affect: Mood normal.        Behavior: Behavior normal.        Thought Content: Thought content normal.        Judgment: Judgment normal.     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  There are two general categories of reconstruction.  We can reconstruction a breast with implants or use the patient's own tissue.  These were further discussed as listed.  Breast reconstruction is an optional procedure and eligibility depends on the full spectrum of the health of the patient and any co-morbidities.  More than one surgery is often needed to complete the reconstruction process.  The process can take three to twelve months to complete.  The breasts will not be identical due to many factors such as rib differences, shoulder asymmetry and treatments such as radiation.  The goal is to get the breasts to look normal and symmetrical in clothes.  Scars are a part of  surgery and may fade some in time but will always be present under clothes.  Surgery may be an option on the non-cancer breast to achieve more symmetry.  No matter which procedure is chosen there is always the risk of complications and even failure of the body to heal.  This could result in no breast.    The options for reconstruction include:  1. Placement of a tissue expander with Acellular dermal matrix. When the expander is the desired size surgery is performed to remove the expander and place an implant.  In some cases the implant can be placed without an expander.  2. Autologous reconstruction can include using a muscle or tissue from  another area of the body to create a breast.  3. Combined procedures (ie. latissismus dorsi flap) can be done with an expander / implant placed under the muscle.   The risks, benefits, scars and recovery time were discussed for each of the above. Risks include bleeding, infection, hematoma, seroma, scarring, pain, wound healing complications, flap loss, fat necrosis, capsular contracture, need for implant removal, donor site complications, bulge, hernia, umbilical necrosis, need for urgent reoperation, and need for dressing changes.   The procedure the patient selected / that was best for the patient, was then discussed in further detail.  Total time: 45 minutes. This includes time spent with the patient during the visit as well as time spent before and after the visit reviewing the chart, documenting the encounter, making phone calls and reviewing studies.   The patient is not interested in autologous reconstruction.  She is most interested in a flat closure.  She understands that the history of radiation of her right breast puts her at a significantly higher increased risk of breakdown and complications if we were to do implant-based reconstruction.  I am additionally concerned about her ulcerative  disease as well as she may need to hold off on any medications that  are immunosuppressive while she goes through the healing process.  I have placed a message to Dr. Hazle Quantintron-Diaz and will discuss the above information with him.  Plan for flap closure in conjunction with mastectomy with Dr. Hazle Quantintron-Diaz.  Likely will not keep the nipple areola complex  Pictures were obtained of the patient and placed in the chart with the patient's or guardian's permission.   Alena Billslaire S Zeidy Tayag, DO

## 2022-03-25 ENCOUNTER — Encounter: Payer: Self-pay | Admitting: Plastic Surgery

## 2022-03-31 ENCOUNTER — Other Ambulatory Visit: Payer: Self-pay | Admitting: Gastroenterology

## 2022-03-31 ENCOUNTER — Telehealth: Payer: Self-pay | Admitting: *Deleted

## 2022-03-31 NOTE — Telephone Encounter (Signed)
Auth pend for CPT [place], [id number], [id number] (no auth req for [id number])  Pend: [id number] via Annamarie Major

## 2022-04-08 ENCOUNTER — Ambulatory Visit: Payer: Self-pay | Admitting: General Surgery

## 2022-04-08 NOTE — H&P (Signed)
PATIENT PROFILE: Kelly Munoz is a 62 y.o. female who presents to the Clinic for consultation at the request of Dr. Tasia Catchings for evaluation of bilateral prophylactic mastectomies.  PCP: Harrold Donath, MD  HISTORY OF PRESENT ILLNESS: Kelly Munoz reports she was diagnosed with breast cancer in 2013. At that time her cancer was stage IIb triple positive right breast cancer. She underwent partial mastectomy with sentinel node biopsy. She completed adjuvant chemotherapy. She completed radiation therapy. About 4 years later she had a genetic testing and she was found to be BRCA2 positive. She is s/p hysterectomy with bilateral salpingo-oophorectomy in October 2017. She has been considering bilateral mastectomies for many years. 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 She is also dealing with recent diagnosis of pancreatic cancer in her father.  PROBLEM LIST: Problem List Date Reviewed: 12/23/2021   Noted  Personal history of other malignant neoplasm of skin 01/01/2020  Healthcare maintenance 03/18/2019  Overview  Negative breast MRI in January 2020. mmg via onc Colonoscopy in January 2019. Surveillance colonoscopy is planned in 2 years with inflamatory bowel followed by Dr. Allen Norris  Osteopenia noted on bone density study in March 2018 (in [place]).  Immunizations: Patient declined Shingrix. Tdap 2020 per Dr. Candiss Norse. Annual influenza vaccination in fall. Covid 3-21    Parkinsonism 09/18/2018  Overview  Per neurology on sinemet   Osteopenia of multiple sites 09/18/2018  History of fatty infiltration of liver 09/18/2018  Overview  Better with weight loss and diet   Transaminitis 05/17/2017  Tremor 03/06/2017  Hypercalcemia 02/15/2017  Intermittent vertigo 0000000  Nonalcoholic steatohepatitis (NASH) 11/01/2016  Crohn's disease (CMS-HCC) Unknown  Overview  [place]   Family history of breast cancer 10/18/2015  Family history of colon cancer  10/18/2015  Family history of prostate cancer 10/18/2015  Osteopenia of multiple sites 09/30/2015  Mixed hyperlipidemia 09/30/2015  Essential hypertension 09/30/2015  Overview  Ramipril and metoprolol   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 09/30/2015  Cancer of right breast with BRCA2 gene mutation (CMS-HCC) 09/09/2015  Breast cancer, right (CMS-HCC) 06/08/2011   GENERAL REVIEW OF SYSTEMS:   General ROS: negative for - chills, fatigue, fever, weight gain or weight loss Allergy and Immunology ROS: negative for - hives  Hematological and Lymphatic ROS: negative for - bleeding problems or bruising, negative for palpable nodes Endocrine ROS: negative for - heat or cold intolerance, hair changes Respiratory ROS: negative for - cough, shortness of breath or wheezing Cardiovascular ROS: no chest pain or palpitations GI ROS: negative for nausea, vomiting, abdominal pain, diarrhea, constipation Musculoskeletal ROS: negative for - joint swelling or muscle pain Neurological ROS: negative for - confusion, syncope Dermatological ROS: negative for pruritus and rash Psychiatric: negative for anxiety, depression, difficulty sleeping and memory loss  MEDICATIONS: Current Outpatient Medications  Medication Sig Dispense Refill  carbidopa-levodopa (SINEMET) 25-100 mg tablet Take 2 tablets by mouth 3 (three) times daily Taking 2 tabs at 6am, 11am, 4pm 540 tablet 3  cholecalciferol, vitamin D3, (VITAMIN D3) 125 mcg (5,000 unit) tablet Take 5,000 Units by mouth once daily  clindamycin (CLEOCIN T) 1 % topical solution Apply topically 2 (two) times daily APPLY TO AFFECTED AREA  cyanocobalamin (VITAMIN B-12) 500 MCG tablet Take 500 mcg by mouth once daily  KRILL OIL ORAL Take 1 capsule by mouth once daily  lidocaine (LIDODERM) 5 % patch APPLY 1 PATCH BY TOPICAL ROUTE ONCE DAILY (MAY WEAR UP TO 12HOURS.)  mesalamine (LIALDA) 1.2 gram EC tablet TAKE 2  TABLETS BY MOUTH ONCE DAILY (Patient taking differently: 1.2 g once daily) 180  tablet 0  metoprolol succinate (TOPROL-XL) 25 MG XL tablet Take 1 tablet (25 mg total) by mouth once daily 90 tablet 3  tamoxifen (NOLVADEX) 20 MG tablet Take 1 tablet by mouth once daily.  tretinoin (RETIN-A) 0.05 % cream  triamcinolone 0.1 % ointment Apply topically 2 (two) times daily 30 g 0  cyanocobalamin (VITAMIN B12) 1000 MCG tablet Take 1 tablet (1,000 mcg total) by mouth once daily (Patient not taking: Reported on 01/19/2022) 30 tablet 11   No current facility-administered medications for this visit.   ALLERGIES: Allergenic extracts, Cat/feline products, Azithromycin, Clindamycin, and Erythromycin  PAST MEDICAL HISTORY: Past Medical History:  Diagnosis Date  BRCA gene mutation positive in female  Breast cancer (CMS-HCC) 2013  Calculus of parotid gland  Colon polyp  Crohn's disease (CMS-HCC)  Hypertension 2011  IBS (irritable bowel syndrome)  Inflammatory bowel disease  Kidney stones 1987  Reynolds syndrome   PAST SURGICAL HISTORY: Past Surgical History:  Procedure Laterality Date  removal of stone 2012  parotid gland stone  MASTECTOMY PARTIAL Right 02/2014  OOPHORECTOMY Bilateral 11/09/2015  HYSTERECTOMY 11/09/2015  COLONOSCOPY 02/06/2017  Ileitis/PHx Crohn's/Repeat 12yrs/MUS  COLONOSCOPY  TONSILLECTOMY    FAMILY HISTORY: Family History  Problem Relation Age of Onset  Breast cancer Mother 21  Hyperlipidemia (Elevated cholesterol) Mother  High blood pressure (Hypertension) Mother  Kidney cancer Father 58  Prostate cancer Maternal Uncle  Colon cancer Maternal Uncle  Stomach cancer Paternal Aunt  Diabetes Maternal Grandmother  Heart disease Maternal Grandmother  Prostate cancer Maternal Grandfather  Heart disease Maternal Grandfather  Myocardial Infarction (Heart attack) Maternal Grandfather  Heart disease Paternal Grandmother  Myocardial Infarction (Heart attack) Paternal Grandfather  Colon cancer Cousin  pateral first cousin  Rheum arthritis Sister   Other Sister  e coli related neurological symptoms  High blood pressure (Hypertension) Brother  Prostate cancer Maternal Uncle  Prostate cancer Maternal Uncle    SOCIAL HISTORY: Social History   Socioeconomic History  Marital status: Married  Number of children: 2  Occupational History  Occupation: Not employed currently  Tobacco Use  Smoking status: Never  Passive exposure: Never  Smokeless tobacco: Never  Vaping Use  Vaping Use: Never used  Substance and Sexual Activity  Alcohol use: No  Drug use: No  Sexual activity: Yes  Partners: Male  Birth control/protection: Surgical   PHYSICAL EXAM: Vitals:  02/08/22 1136  BP: (!) 143/89  Pulse: 97   Body mass index is 27.54 kg/m. Weight: 69.4 kg (153 lb)   GENERAL: Alert, active, oriented x3  HEENT: Pupils equal reactive to light. Extraocular movements are intact. Sclera clear. Palpebral conjunctiva normal red color.Pharynx clear.  NECK: Supple with no palpable mass and no adenopathy.  LUNGS: Sound clear with no rales rhonchi or wheezes.  HEART: Regular rhythm S1 and S2 without murmur.  BREAST: Breast examined in the sitting and supine position. There was no palpable masses, skin changes, nipple retraction or nipple discharge. There is dense scar from previous breast reduction. No axillary adenopathy bilaterally.  ABDOMEN: Soft and depressible, nontender with no palpable mass, no hepatomegaly.   EXTREMITIES: Well-developed well-nourished symmetrical with no dependent edema.  NEUROLOGICAL: Awake alert oriented, facial expression symmetrical, moving all extremities.  REVIEW OF DATA: I have reviewed the following data today: Ancillary Procedure on 01/06/2022  Component Date Value  LV Ejection Fraction (%) 01/06/2022 55  Aortic Valve Stenosis Gr* 01/06/2022 none  Aortic Valve Regurgitati*  01/06/2022 none  Aortic Valve Max Velocit* 01/06/2022 1.4  Aortic Valve Stenosis Me* 01/06/2022 5.0  Mitral Valve Stenosis  Gr* 01/06/2022 none  Mitral Valve Regurgitati* 01/06/2022 trivial  Tricuspid Valve Regurgit* 01/06/2022 trivial  Tricuspid Valve Regurgit* 01/06/2022 2.3  Right Ventricle Systolic* A999333 [id number]  LV End Diastolic Diamete* A999333 4.1  LV End Systolic Diameter* A999333 1.7  LV Septum Wall Thickness* 01/06/2022 0.96  LV Posterior Wall Thickn* 01/06/2022 0.84  Left Atrium Diameter (cm) 01/06/2022 3.5  Office Visit on 12/23/2021  Component Date Value  Vent Rate (bpm) 12/23/2021 83  PR Interval (msec) 12/23/2021 164  QRS Interval (msec) 12/23/2021 82  QT Interval (msec) 12/23/2021 384  QTc (msec) 12/23/2021 451    ASSESSMENT: Kelly Munoz is a 62 y.o. female presenting for consultation for prophylactic bilateral breast mastectomies.  Patient with previous history of right breast cancer stage IIb, triple positive s/p radiation therapy, adjuvant chemotherapy. Patient is currently on tamoxifen. She has been on tamoxifen for 10 years.  She has been discussing 4 years with her oncologist about the recommendation of prophylactic mastectomy. Today she feels ready to start a discussion of what this means. I had a long discussion with the patient about the alternative of bilateral mastectomy with and without reconstruction. Even though she is leaning towards not having reconstruction she would like to consider and understand her options. I will put plastic surgery referral for evaluation and discussion of alternatives.  I gave patient an her wife ample time for asking any question about total mastectomy. We discussed about the surgical technique. We discussed about the recovery period. We discussed about need of drains.  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   Herbert Pun, MD

## 2022-04-15 ENCOUNTER — Telehealth: Payer: Self-pay | Admitting: Plastic Surgery

## 2022-04-15 ENCOUNTER — Telehealth: Payer: Self-pay | Admitting: *Deleted

## 2022-04-15 NOTE — Telephone Encounter (Signed)
[id number] approved 04/11/22 - 10/08/22 via [employer]  CPT 13101  No auth req [id number], 02

## 2022-04-15 NOTE — Telephone Encounter (Signed)
Pt called regarding surgery with Dr. Maia Plan and Dr. Ulice Bold on 05/09/22.  Will we contact her insurance and also provide an estimate? Will she have a preOp appt with [facility] as well?  Pt has more questions she would like to discuss with Dr. Ulice Bold as well.

## 2022-04-19 ENCOUNTER — Ambulatory Visit (INDEPENDENT_AMBULATORY_CARE_PROVIDER_SITE_OTHER): Payer: BLUE CROSS/BLUE SHIELD | Admitting: Plastic Surgery

## 2022-04-19 ENCOUNTER — Encounter: Payer: Self-pay | Admitting: Plastic Surgery

## 2022-04-19 DIAGNOSIS — Z1509 Genetic susceptibility to other malignant neoplasm: Secondary | ICD-10-CM

## 2022-04-19 DIAGNOSIS — Z170 Estrogen receptor positive status [ER+]: Secondary | ICD-10-CM | POA: Diagnosis not present

## 2022-04-19 DIAGNOSIS — C50911 Malignant neoplasm of unspecified site of right female breast: Secondary | ICD-10-CM

## 2022-04-19 DIAGNOSIS — Z1501 Genetic susceptibility to malignant neoplasm of breast: Secondary | ICD-10-CM | POA: Diagnosis not present

## 2022-04-19 NOTE — Progress Notes (Signed)
   Subjective:    Patient ID: Kelly Munoz, female    DOB: 09-06-1960, 62 y.o.   MRN: [id number]  The patient is a 62 year old female joining me phone for further discussion about her breast reconstruction.  Her history is that in 2013 she was diagnosed with right-sided breast cancer that was triple positive.  She had genetic testing which was negative at the time.  She underwent a partial mastectomy with radiation on the right side.  She then had bilateral breast reductions.  In 2017 she had genetic testing again which showed she was BRCA positive.  She is not a smoker and does not have diabetes.  Her recent mammogram and MRI were negative.  Past medical history is positive for hypertension and ulcerative colitis.  She has had a hysterectomy and bilateral breast surgery.  She is thinking about bilateral mastectomies      Review of Systems  Constitutional: Negative.   Eyes: Negative.   Respiratory: Negative.    Cardiovascular: Negative.   Gastrointestinal: Negative.   Endocrine: Negative.   Genitourinary: Negative.   Musculoskeletal: Negative.        Objective:   Physical Exam      Assessment & Plan:     ICD-10-CM   1. 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 Malignant neoplasm of right breast in female, estrogen receptor positive, unspecified site of breast  C50.911    Z17.0       I connected with  [name] on 04/19/22 by phone and verified that I am speaking with the correct person using two identifiers.  The patient was at home and I was at the office.  We spent 10 minutes in discussion.   The patient is still planning on not having reconstruction.  She is going to talk with Dr. Hazle Quant and the 2 of them will decide if they need be to be present for the closure.

## 2022-04-19 NOTE — H&P (View-Only) (Signed)
   Subjective:    Patient ID: Kelly Munoz, female    DOB: 02/12/1960, 61 y.o.   MRN: [id number]  The patient is a 61-year-old female joining me phone for further discussion about her breast reconstruction.  Her history is that in 2013 she was diagnosed with right-sided breast cancer that was triple positive.  She had genetic testing which was negative at the time.  She underwent a partial mastectomy with radiation on the right side.  She then had bilateral breast reductions.  In 2017 she had genetic testing again which showed she was BRCA positive.  She is not a smoker and does not have diabetes.  Her recent mammogram and MRI were negative.  Past medical history is positive for hypertension and ulcerative colitis.  She has had a hysterectomy and bilateral breast surgery.  She is thinking about bilateral mastectomies      Review of Systems  Constitutional: Negative.   Eyes: Negative.   Respiratory: Negative.    Cardiovascular: Negative.   Gastrointestinal: Negative.   Endocrine: Negative.   Genitourinary: Negative.   Musculoskeletal: Negative.        Objective:   Physical Exam      Assessment & Plan:     ICD-10-CM   1. 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 Malignant neoplasm of right breast in female, estrogen receptor positive, unspecified site of breast  C50.911    Z17.0       I connected with  Sandee P Faries on 04/19/22 by phone and verified that I am speaking with the correct person using two identifiers.  The patient was at home and I was at the office.  We spent 10 minutes in discussion.   The patient is still planning on not having reconstruction.  She is going to talk with Dr. Cintron-Diaz and the 2 of them will decide if they need be to be present for the closure.  

## 2022-04-20 ENCOUNTER — Encounter: Payer: Self-pay | Admitting: Oncology

## 2022-04-21 NOTE — Telephone Encounter (Signed)
Ana, would you be able to contact pt, not sure what kind of questions she may have.

## 2022-04-22 ENCOUNTER — Encounter: Payer: Self-pay | Admitting: *Deleted

## 2022-04-22 NOTE — Progress Notes (Signed)
Kelly Munoz asked for a nurse navigator to reach out to her.  She is interested in speaking to a couple patients that have gone through mastectomies but opted not to have reconstruction.  I will reach out to a couple patients and see if they are willing to talk to her about their experiences.

## 2022-04-27 ENCOUNTER — Inpatient Hospital Stay: Payer: BLUE CROSS/BLUE SHIELD | Attending: Internal Medicine | Admitting: Occupational Therapy

## 2022-04-27 DIAGNOSIS — R293 Abnormal posture: Secondary | ICD-10-CM

## 2022-04-27 NOTE — Therapy (Signed)
[facility] at [facility] [address], [address] [place], [place], [address] Phone: [phone]   Fax:  [phone]  Occupational Therapy Screen  Patient Details  Name: Kelly Munoz MRN: [id number] Date of Birth: [birth year] No data recorded  Encounter Date: 04/27/2022   OT End of Session - 04/27/22 1459     Visit Number 0             Past Medical History:  Diagnosis Date   Abnormal LFTs (liver function tests) 02/10/2017   Anal fissure 07/07/2017   Breast cancer    Breast cancer, right 06/08/2011   Family history of breast cancer    Family history of colon cancer    Family history of prostate cancer    History of IBS    History of kidney stones    Hypertension    Lymphocytosis 08/19/2019   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Parkinson disease 2020   Personal history of chemotherapy 2013   RIGHT lumpectomy   Personal history of radiation therapy 2013   RIGHT lumpectomy    Past Surgical History:  Procedure Laterality Date   ABDOMINAL HYSTERECTOMY     BREAST BIOPSY Right 2013   lumpectomy chem and rad   BREAST LUMPECTOMY Right 2013   COLONOSCOPY WITH PROPOFOL N/A 02/06/2017   Procedure: COLONOSCOPY WITH PROPOFOL;  Surgeon: Christena Deem, MD;  Location: [facility] ENDOSCOPY;  Service: Endoscopy;  Laterality: N/A;   COLONOSCOPY WITH PROPOFOL N/A 08/02/2021   Procedure: COLONOSCOPY WITH PROPOFOL;  Surgeon: Toney Reil, MD;  Location: [facility] ENDOSCOPY;  Service: Gastroenterology;  Laterality: N/A;   CYSTOSCOPY N/A 11/09/2015   Procedure: CYSTOSCOPY;  Surgeon: Elenora Fender Ward, MD;  Location: [facility] ORS;  Service: Gynecology;  Laterality: N/A;   parotid stone      Removal   parotid stones Left    REDUCTION MAMMAPLASTY Bilateral 02/2013   TONSILLECTOMY      There were no vitals filed for this visit.   Subjective Assessment - 04/27/22 1455     Subjective  I am scheduled for double mastectomy 05/09/22 prophylactic.   I had about 11 years ago my lumpectomy on the right with some cording in the removed about 4-5 lymph nodes.  I did had radiation and chemo.  I just wanted you to take bilateral circumferences of my upper extremity prior to surgery and then to know when to follow-up with you motion after surgery.    Currently in Pain? No/denies                 LYMPHEDEMA/ONCOLOGY QUESTIONNAIRE - 04/27/22 0001       Right Upper Extremity Lymphedema   15 cm Proximal to Olecranon Process 30 cm    10 cm Proximal to Olecranon Process 27.8 cm    Olecranon Process 24.5 cm    15 cm Proximal to Ulnar Styloid Process 22.7 cm    10 cm Proximal to Ulnar Styloid Process 20 cm    Just Proximal to Ulnar Styloid Process 16 cm    Across Hand at [facility] 18 cm      Left Upper Extremity Lymphedema   15 cm Proximal to Olecranon Process 29 cm    10 cm Proximal to Olecranon Process 27.4 cm    Olecranon Process 24.4 cm    15 cm Proximal to Ulnar Styloid Process 22.5 cm    10 cm Proximal to Ulnar Styloid Process 19 cm    Just  Proximal to Ulnar Styloid Process 16 cm    Across Hand at [facility] 17.5 cm               Dr Cathie Hoops visit 01/27/22: History of Stage IIB Triple positive breast cancer - 05/2011 S/p lumpectomy followed by adjvuant chemotherapy TCH x 6 followed by adjuvant RT, finished 1 year of transtuzumab.  On tamoxifen since 01/2012, Patient is postmenopausal, she prefers to stay on tamoxifen instead of switching to aromatase inhibitor. She also prefers to continue treatment beyond 10 years.  Currently she is on active surveillance with annual MRI breast and annual mammogram.  We discussed about the option of bilateral prophylactic mastectomy as primary prevention.  She is undecided due to her mother's mastectomy experience. She is interested in discussing with surgery. Refer to surgery  Labs from [facility] were reviewed.  Stable CA 27-29    OT SCREEN 04/27/22: Patient self-referred for  assessment of bilateral upper extremities to get a baseline prior to prophylactic double mastectomies 05/09/2022. Patient had 11 years ago right lumpectomy with removal of about 4-5 lymph nodes.  Patient did had radiation and chemo.  Patient also did have some lymphatic cording at that time. Since then no signs and symptoms of lymphedema. Patient's bilateral circumference within normal limits compared to each other.  Patient to follow-up with surgeon after mastectomy and when clear to start range of motion -patient will contact me.                              Visit Diagnosis: Abnormal posture    Problem List Patient Active Problem List   Diagnosis Date Noted   Family history of pancreatic cancer 01/27/2022   Indeterminate colitis    Goals of care, counseling/discussion 08/19/2019   History of fatty infiltration of liver 09/18/2018   Parkinsonism 09/18/2018   Tremor 08/10/2017   Hypercalcemia 02/15/2017   Intermittent vertigo 11/05/2016   Nonalcoholic steatohepatitis (NASH) 11/01/2016   Osteopenia 02/14/2016   Family history of breast cancer    Family history of colon cancer    Family history of prostate cancer    History of breast cancer 09/30/2015   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 09/30/2015   Crohn's disease 09/30/2015   Mixed hyperlipidemia 09/30/2015   Cancer of right breast with BRCA2 gene mutation 09/09/2015   Benign essential hypertension 08/27/2014   Disequilibrium 08/27/2014   Breast cancer, right 06/08/2011    [name], [name],[name] 04/27/2022, 3:01 PM  [facility] at [facility] [address], [address] [place], [place], [address] Phone: [phone]   Fax:  [phone]  Name: Kelly Munoz MRN: [id number] Date of Birth: 07-Feb-1960

## 2022-04-29 ENCOUNTER — Ambulatory Visit: Payer: Self-pay | Admitting: General Surgery

## 2022-04-29 NOTE — H&P (View-Only) (Signed)
HISTORY OF PRESENT ILLNESS:    Kelly Munoz is a 62 y.o.female patient who comes for follow up for preoperative evaluation of bilateral total mastectomy.  Kelly Munoz reports she was diagnosed with breast cancer in 2013.  At that time her cancer was stage IIb triple positive right breast cancer.  She underwent partial mastectomy with sentinel node biopsy.  She completed adjuvant chemotherapy.  She completed radiation therapy.  About 4 years later she had a genetic testing and she was found to be BRCA2 positive.  She is s/p hysterectomy with bilateral salpingo-oophorectomy in October 2017.  She has been considering bilateral mastectomies for many years.  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  She is also dealing with recent diagnosis of pancreatic cancer in her father.  She underwent upper endoscopic ultrasound without any specific findings.  She was evaluated by plastic surgery for discussion of reconstruction alternative after prophylactic bilateral mastectomy due to BRCA2.  After their evaluation patient elected to proceed with mastectomy without reconstruction with flap closure.      PAST MEDICAL HISTORY:  Past Medical History:  Diagnosis Date   BRCA gene mutation positive in female    Breast cancer (CMS/HHS-HCC) 2013   Calculus of parotid gland    Colon polyp    Crohn's disease (CMS/HHS-HCC)    Hypertension 2011   IBS (irritable bowel syndrome)    Inflammatory bowel disease    Kidney stones 1987   Motion sickness    in a car while trying to read   Parkinsonism    clinical diagnosis by neurology   PONV (postoperative nausea and vomiting)    nausea with GA.   Reynolds syndrome  (CMS/HHS-HCC)         PAST SURGICAL HISTORY:   Past Surgical History:  Procedure Laterality Date   removal of stone  2012   parotid gland stone   MASTECTOMY PARTIAL Right 02/2014   OOPHORECTOMY Bilateral 11/09/2015   HYSTERECTOMY  11/09/2015   COLONOSCOPY   02/06/2017   Ileitis/PHx Crohn's/Repeat 2yrs/MUS   COLONOSCOPY     TONSILLECTOMY           MEDICATIONS:  Outpatient Encounter Medications as of 04/28/2022  Medication Sig Dispense Refill   carbidopa-levodopa (SINEMET) 25-100 mg tablet Take 2 tablets by mouth 3 (three) times daily Taking 2 tabs at 6am, 11am, 4pm 540 tablet 3   cholecalciferol, vitamin D3, (VITAMIN D3) 125 mcg (5,000 unit) tablet Take 5,000 Units by mouth every morning     cyanocobalamin (VITAMIN B-12) 500 MCG tablet Take 500 mcg by mouth every morning     KRILL OIL ORAL Take 1 capsule by mouth every morning     lidocaine (LIDODERM) 5 % patch APPLY 1 PATCH BY TOPICAL ROUTE ONCE DAILY (MAY WEAR UP TO 12HOURS.)     mesalamine (LIALDA) 1.2 gram EC tablet TAKE 2 TABLETS BY MOUTH  ONCE DAILY 180 tablet 0   metoprolol succinate (TOPROL-XL) 25 MG XL tablet TAKE 1 TABLET BY MOUTH ONCE  DAILY (Patient taking differently: Take 25 mg by mouth every morning) 90 tablet 3   tamoxifen (NOLVADEX) 20 MG tablet Take 20 mg by mouth every morning     clindamycin (CLEOCIN T) 1 % topical solution Apply topically 2 (two) times daily APPLY TO AFFECTED AREA (Patient not taking: Reported on 04/26/2022)     cyanocobalamin (VITAMIN B12) 1000 MCG tablet Take 1 tablet (1,000 mcg total) by mouth once daily (Patient not taking: Reported   on 01/19/2022) 30 tablet 11   tretinoin (RETIN-A) 0.05 % cream  (Patient not taking: Reported on 04/26/2022)     triamcinolone 0.1 % ointment Apply topically 2 (two) times daily (Patient not taking: Reported on 04/26/2022) 30 g 0   No facility-administered encounter medications on file as of 04/28/2022.     ALLERGIES:   Allergenic extracts, Cat/feline products, Azithromycin, Clindamycin, and Erythromycin   SOCIAL HISTORY:  Social History   Socioeconomic History   Marital status: Married   Number of children: 2  Occupational History   Occupation: Not employed currently  Tobacco Use   Smoking status: Never    Passive  exposure: Never   Smokeless tobacco: Never  Vaping Use   Vaping status: Never Used  Substance and Sexual Activity   Alcohol use: No   Drug use: No   Sexual activity: Yes    Partners: Male    Birth control/protection: Surgical   Social Determinants of Health   Financial Resource Strain: Low Risk  (03/22/2022)   Overall Financial Resource Strain (CARDIA)    Difficulty of Paying Living Expenses: Not hard at all    FAMILY HISTORY:  Family History  Problem Relation Name Age of Onset   Breast cancer Mother  69   Hyperlipidemia (Elevated cholesterol) Mother     High blood pressure (Hypertension) Mother     Kidney cancer Father  60   Pancreatic cancer Father  85   Rheum arthritis Sister Val    Other Sister Val        e coli related neurological symptoms   High blood pressure (Hypertension) Brother Tom    Diabetes Maternal Grandmother     Heart disease Maternal Grandmother     Prostate cancer Maternal Grandfather     Heart disease Maternal Grandfather     Myocardial Infarction (Heart attack) Maternal Grandfather     Heart disease Paternal Grandmother     Myocardial Infarction (Heart attack) Paternal Grandfather     Prostate cancer Maternal Uncle Danny    Colon cancer Maternal Uncle Danny    Prostate cancer Maternal Uncle Bernie    Prostate cancer Maternal Uncle Raymond    Stomach cancer Paternal Aunt Dorothy    Colon cancer Cousin Rich        pateral first cousin   Anesthesia problems Neg Hx       GENERAL REVIEW OF SYSTEMS:   General ROS: negative for - chills, fatigue, fever, weight gain or weight loss Allergy and Immunology ROS: negative for - hives  Hematological and Lymphatic ROS: negative for - bleeding problems or bruising, negative for palpable nodes Endocrine ROS: negative for - heat or cold intolerance, hair changes Respiratory ROS: negative for - cough, shortness of breath or wheezing Cardiovascular ROS: no chest pain or palpitations GI ROS: negative for nausea,  vomiting, abdominal pain, diarrhea, constipation Musculoskeletal ROS: negative for - joint swelling or muscle pain Neurological ROS: negative for - confusion, syncope Dermatological ROS: negative for pruritus and rash  PHYSICAL EXAM:  Vitals:   04/28/22 1022  BP: (!) 140/85  Pulse: 97  .  Ht:158.5 cm (5' 2.4") Wt:72.5 kg (159 lb 13.3 oz) BSA:Body surface area is 1.79 meters squared. Body mass index is 28.86 kg/m..   GENERAL: Alert, active, oriented x3  HEENT: Pupils equal reactive to light. Extraocular movements are intact. Sclera clear. Palpebral conjunctiva normal red color.Pharynx clear.  NECK: Supple with no palpable mass and no adenopathy.  LUNGS: Sound clear with no rales rhonchi or   wheezes.  HEART: Regular rhythm S1 and S2 without murmur.  BREAST: Both breast examined in the sitting and supine position.  There is no palpable masses, skin changes, nipple retraction or nipple discharge.  Right breast with thickening of the skin from radiation changes.  EXTREMITIES: Well-developed well-nourished symmetrical with no dependent edema.  NEUROLOGICAL: Awake alert oriented, facial expression symmetrical, moving all extremities.      IMPRESSION:     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 [Z15.01, Z15.09] -Previous history of right breast cancer stage IIb, triple positive s/p partial mastectomy with sentinel node biopsy and adjuvant radiation therapy and adjuvant chemotherapy patient has been on endocrine therapy for 10 years -She had decided to proceed with bilateral mastectomies. -She does not want immediate reconstruction -We discussed about bilateral total mastectomy with flap closure.  We discussed about the procedure.  We discussed about the risk including bleeding, infection, seroma, hematoma, pain, lymphedema, cosmetic issues, among others.  The patient reports she understood and agreed to proceed.          PLAN:  1.  Bilateral total mastectomy with flap closure (19303) 2.  Contact us if  you have any concern  Patient and her husband verbalized understanding, all questions were answered, and were agreeable with the plan outlined above.   Kaylani Fromme Cintron-Diaz, MD  Electronically signed by Amily Depp Cintron-Diaz, MD  

## 2022-04-29 NOTE — H&P (Signed)
HISTORY OF PRESENT ILLNESS:    Kelly Munoz is a 62 y.o.female patient who comes for follow up for preoperative evaluation of bilateral total mastectomy.  Ms. Kelly Munoz reports she was diagnosed with breast cancer in 2013.  At that time her cancer was stage IIb triple positive right breast cancer.  She underwent partial mastectomy with sentinel node biopsy.  She completed adjuvant chemotherapy.  She completed radiation therapy.  About 4 years later she had a genetic testing and she was found to be BRCA2 positive.  She is s/p hysterectomy with bilateral salpingo-oophorectomy in October 2017.  She has been considering bilateral mastectomies for many years.  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  She is also dealing with recent diagnosis of pancreatic cancer in her father.  She underwent upper endoscopic ultrasound without any specific findings.  She was evaluated by plastic surgery for discussion of reconstruction alternative after prophylactic bilateral mastectomy due to BRCA2.  After their evaluation patient elected to proceed with mastectomy without reconstruction with flap closure.      PAST MEDICAL HISTORY:  Past Medical History:  Diagnosis Date   BRCA gene mutation positive in female    Breast cancer (CMS/HHS-HCC) 2013   Calculus of parotid gland    Colon polyp    Crohn's disease (CMS/HHS-HCC)    Hypertension 2011   IBS (irritable bowel syndrome)    Inflammatory bowel disease    Kidney stones 1987   Motion sickness    in a car while trying to read   Parkinsonism    clinical diagnosis by neurology   PONV (postoperative nausea and vomiting)    nausea with GA.   Reynolds syndrome  (CMS/HHS-HCC)         PAST SURGICAL HISTORY:   Past Surgical History:  Procedure Laterality Date   removal of stone  2012   parotid gland stone   MASTECTOMY PARTIAL Right 02/2014   OOPHORECTOMY Bilateral 11/09/2015   HYSTERECTOMY  11/09/2015   COLONOSCOPY   02/06/2017   Ileitis/PHx Crohn's/Repeat 80yrs/MUS   COLONOSCOPY     TONSILLECTOMY           MEDICATIONS:  Outpatient Encounter Medications as of 04/28/2022  Medication Sig Dispense Refill   carbidopa-levodopa (SINEMET) 25-100 mg tablet Take 2 tablets by mouth 3 (three) times daily Taking 2 tabs at 6am, 11am, 4pm 540 tablet 3   cholecalciferol, vitamin D3, (VITAMIN D3) 125 mcg (5,000 unit) tablet Take 5,000 Units by mouth every morning     cyanocobalamin (VITAMIN B-12) 500 MCG tablet Take 500 mcg by mouth every morning     KRILL OIL ORAL Take 1 capsule by mouth every morning     lidocaine (LIDODERM) 5 % patch APPLY 1 PATCH BY TOPICAL ROUTE ONCE DAILY (MAY WEAR UP TO 12HOURS.)     mesalamine (LIALDA) 1.2 gram EC tablet TAKE 2 TABLETS BY MOUTH  ONCE DAILY 180 tablet 0   metoprolol succinate (TOPROL-XL) 25 MG XL tablet TAKE 1 TABLET BY MOUTH ONCE  DAILY (Patient taking differently: Take 25 mg by mouth every morning) 90 tablet 3   tamoxifen (NOLVADEX) 20 MG tablet Take 20 mg by mouth every morning     clindamycin (CLEOCIN T) 1 % topical solution Apply topically 2 (two) times daily APPLY TO AFFECTED AREA (Patient not taking: Reported on 04/26/2022)     cyanocobalamin (VITAMIN B12) 1000 MCG tablet Take 1 tablet (1,000 mcg total) by mouth once daily (Patient not taking: Reported  on 01/19/2022) 30 tablet 11   tretinoin (RETIN-A) 0.05 % cream  (Patient not taking: Reported on 04/26/2022)     triamcinolone 0.1 % ointment Apply topically 2 (two) times daily (Patient not taking: Reported on 04/26/2022) 30 g 0   No facility-administered encounter medications on file as of 04/28/2022.     ALLERGIES:   Allergenic extracts, Cat/feline products, Azithromycin, Clindamycin, and Erythromycin   SOCIAL HISTORY:  Social History   Socioeconomic History   Marital status: Married   Number of children: 2  Occupational History   Occupation: Not employed currently  Tobacco Use   Smoking status: Never    Passive  exposure: Never   Smokeless tobacco: Never  Vaping Use   Vaping status: Never Used  Substance and Sexual Activity   Alcohol use: No   Drug use: No   Sexual activity: Yes    Partners: Male    Birth control/protection: Surgical   Social Determinants of Health   Financial Resource Strain: Low Risk  (03/22/2022)   Overall Financial Resource Strain (CARDIA)    Difficulty of Paying Living Expenses: Not hard at all    FAMILY HISTORY:  Family History  Problem Relation Name Age of Onset   Breast cancer Mother  69   Hyperlipidemia (Elevated cholesterol) Mother     High blood pressure (Hypertension) Mother     Kidney cancer Father  52   Pancreatic cancer Father  55   Rheum arthritis Sister Val    Other Sister Val        e coli related neurological symptoms   High blood pressure (Hypertension) Brother Tom    Diabetes Maternal Grandmother     Heart disease Maternal Grandmother     Prostate cancer Maternal Grandfather     Heart disease Maternal Grandfather     Myocardial Infarction (Heart attack) Maternal Grandfather     Heart disease Paternal Grandmother     Myocardial Infarction (Heart attack) Paternal Grandfather     Prostate cancer Maternal Uncle Danny    Colon cancer Maternal Uncle Danny    Prostate cancer Maternal Uncle Bernie    Prostate cancer Maternal Uncle Raymond    Stomach cancer Paternal Aunt Dorothy    Colon cancer Cousin Rich        pateral first cousin   Anesthesia problems Neg Hx       GENERAL REVIEW OF SYSTEMS:   General ROS: negative for - chills, fatigue, fever, weight gain or weight loss Allergy and Immunology ROS: negative for - hives  Hematological and Lymphatic ROS: negative for - bleeding problems or bruising, negative for palpable nodes Endocrine ROS: negative for - heat or cold intolerance, hair changes Respiratory ROS: negative for - cough, shortness of breath or wheezing Cardiovascular ROS: no chest pain or palpitations GI ROS: negative for nausea,  vomiting, abdominal pain, diarrhea, constipation Musculoskeletal ROS: negative for - joint swelling or muscle pain Neurological ROS: negative for - confusion, syncope Dermatological ROS: negative for pruritus and rash  PHYSICAL EXAM:  Vitals:   04/28/22 1022  BP: (!) 140/85  Pulse: 97  .  Ht:158.5 cm (5' 2.4") Wt:72.5 kg (159 lb 13.3 oz) ZOX:WRUE surface area is 1.79 meters squared. Body mass index is 28.86 kg/m.Marland Kitchen   GENERAL: Alert, active, oriented x3  HEENT: Pupils equal reactive to light. Extraocular movements are intact. Sclera clear. Palpebral conjunctiva normal red color.Pharynx clear.  NECK: Supple with no palpable mass and no adenopathy.  LUNGS: Sound clear with no rales rhonchi or  wheezes.  HEART: Regular rhythm S1 and S2 without murmur.  BREAST: Both breast examined in the sitting and supine position.  There is no palpable masses, skin changes, nipple retraction or nipple discharge.  Right breast with thickening of the skin from radiation changes.  EXTREMITIES: Well-developed well-nourished symmetrical with no dependent edema.  NEUROLOGICAL: Awake alert oriented, facial expression symmetrical, moving all extremities.      IMPRESSION:     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 [Z15.01, Z15.09] -Previous history of right breast cancer stage IIb, triple positive s/p partial mastectomy with sentinel node biopsy and adjuvant radiation therapy and adjuvant chemotherapy patient has been on endocrine therapy for 10 years -She had decided to proceed with bilateral mastectomies. -She does not want immediate reconstruction -We discussed about bilateral total mastectomy with flap closure.  We discussed about the procedure.  We discussed about the risk including bleeding, infection, seroma, hematoma, pain, lymphedema, cosmetic issues, among others.  The patient reports she understood and agreed to proceed.          PLAN:  1.  Bilateral total mastectomy with flap closure (16109) 2.  Contact us if  you have any concern  Patient and her husband verbalized understanding, all questions were answered, and were agreeable with the plan outlined above.   Carolan Shiver, MD  Electronically signed by Carolan Shiver, MD

## 2022-05-02 ENCOUNTER — Encounter
Admission: RE | Admit: 2022-05-02 | Discharge: 2022-05-02 | Disposition: A | Discharge status: Home / Self Care | Payer: BLUE CROSS/BLUE SHIELD | Source: Ambulatory Visit | Attending: General Surgery | Admitting: General Surgery

## 2022-05-02 ENCOUNTER — Other Ambulatory Visit: Payer: Self-pay

## 2022-05-02 HISTORY — DX: Cardiac murmur, unspecified: R01.1

## 2022-05-02 HISTORY — DX: Crohn's disease, unspecified, without complications: K50.90

## 2022-05-02 HISTORY — DX: Raynaud's syndrome without gangrene: I73.00

## 2022-05-02 HISTORY — DX: Genetic susceptibility to malignant neoplasm of breast: Z15.09

## 2022-05-02 HISTORY — DX: Unspecified malignant neoplasm of skin, unspecified: C44.90

## 2022-05-02 HISTORY — DX: Parkinsonism, unspecified: G20.C

## 2022-05-02 HISTORY — DX: Nonalcoholic steatohepatitis (NASH): K75.81

## 2022-05-02 HISTORY — DX: Mixed hyperlipidemia: E78.2

## 2022-05-02 HISTORY — DX: Genetic susceptibility to other malignant neoplasm: Z15.01

## 2022-05-02 HISTORY — DX: Other specified postprocedural states: Z98.890

## 2022-05-02 NOTE — Patient Instructions (Addendum)
Your procedure is scheduled on: Monday 05/09/22 To find out your arrival time, please call [phone] between 1PM - 3PM on:   Friday 05/06/22 Report to the Registration Desk on the 1st floor of the [facility]. Valet parking is available.  If your arrival time is 6:00 am, do not arrive before that time as the [facility] entrance doors do not open until 6:00 am.  REMEMBER: Instructions that are not followed completely may result in serious medical risk, up to and including death; or upon the discretion of your surgeon and anesthesiologist your surgery may need to be rescheduled.  Do not eat food or drink any liquids after midnight the night before surgery.  No gum chewing or hard candies.  One week prior to surgery: Stop Anti-inflammatories (NSAIDS) such as Advil, Aleve, Ibuprofen, Motrin, Naproxen, Naprosyn and Aspirin based products such as Excedrin, Goody's Powder, BC Powder. You may however, continue to take Tylenol if needed for pain up until the day of surgery.  Stop ANY OVER THE COUNTER supplements or vitamins until after surgery.  Continue taking all prescribed medications.   TAKE ONLY THESE MEDICATIONS THE MORNING OF SURGERY WITH A SIP OF WATER:  carbidopa-levodopa (SINEMET IR) 25-100 MG tablet  metoprolol succinate (TOPROL-XL) 25 MG 24 hr tablet  tamoxifen (NOLVADEX) 20 MG tablet   No Alcohol for 24 hours before or after surgery.  No Smoking including e-cigarettes for 24 hours before surgery.  No chewable tobacco products for at least 6 hours before surgery.  No nicotine patches on the day of surgery.  Do not use any "recreational" drugs for at least a week (preferably 2 weeks) before your surgery.  Please be advised that the combination of cocaine and anesthesia may have negative outcomes, up to and including death. If you test positive for cocaine, your surgery will be cancelled.  On the morning of surgery brush your teeth with toothpaste and water, you may rinse  your mouth with mouthwash if you wish. Do not swallow any toothpaste or mouthwash.  Use CHG Soap or wipes as directed on instruction sheet.  Do not wear lotions, powders, or perfumes. No deodorant.  Do not shave body hair from the neck down 48 hours before surgery.  Wear comfortable clothing (specific to your surgery type) to the hospital.  Do not wear jewelry, make-up, hairpins, clips or nail polish.  Contact lenses, hearing aids and dentures may not be worn into surgery.  Do not bring valuables to the hospital. [facility] is not responsible for any missing/lost belongings or valuables.   Notify your doctor if there is any change in your medical condition (cold, fever, infection).  If you are being discharged the day of surgery, you will not be allowed to drive home. You will need a responsible individual to drive you home and stay with you for 24 hours after surgery.   If you are taking public transportation, you will need to have a responsible individual with you.  If you are being admitted to the hospital overnight, leave your suitcase in the car. After surgery it may be brought to your room.  In case of increased patient census, it may be necessary for you, the patient, to continue your postoperative care in the Same Day Surgery department.  After surgery, you can help prevent lung complications by doing breathing exercises.  Take deep breaths and cough every 1-2 hours. Your doctor may order a device called an Incentive Spirometer to help you take deep breaths. When  coughing or sneezing, hold a pillow firmly against your incision with both hands. This is called "splinting." Doing this helps protect your incision. It also decreases belly discomfort.  Surgery Visitation Policy:  Patients undergoing a surgery or procedure may have two family members or support persons with them as long as the person is not COVID-19 positive or experiencing its symptoms.   Inpatient Visitation:     Visiting hours are 7 a.m. to 8 p.m. Up to four visitors are allowed at one time in a patient room. The visitors may rotate out with other people during the day. One designated support person (adult) may remain overnight.  Please call the Pre-admissions Testing Dept. at [phone] if you have any questions about these instructions.     Preparing for Surgery with CHLORHEXIDINE GLUCONATE (CHG) Soap  Chlorhexidine Gluconate (CHG) Soap  o An antiseptic cleaner that kills germs and bonds with the skin to continue killing germs even after washing  o Used for showering the night before surgery and morning of surgery  Before surgery, you can play an important role by reducing the number of germs on your skin.  CHG (Chlorhexidine gluconate) soap is an antiseptic cleanser which kills germs and bonds with the skin to continue killing germs even after washing.  Please do not use if you have an allergy to CHG or antibacterial soaps. If your skin becomes reddened/irritated stop using the CHG.  1. Shower the NIGHT BEFORE SURGERY and the MORNING OF SURGERY with CHG soap.  2. If you choose to wash your hair, wash your hair first as usual with your normal shampoo.  3. After shampooing, rinse your hair and body thoroughly to remove the shampoo.  4. Use CHG as you would any other liquid soap. You can apply CHG directly to the skin and wash gently with a scrungie or a clean washcloth.  5. Apply the CHG soap to your body only from the neck down. Do not use on open wounds or open sores. Avoid contact with your eyes, ears, mouth, and genitals (private parts). Wash face and genitals (private parts) with your normal soap.  6. Wash thoroughly, paying special attention to the area where your surgery will be performed.  7. Thoroughly rinse your body with warm water.  8. Do not shower/wash with your normal soap after using and rinsing off the CHG soap.  9. Pat yourself dry with a clean towel.  10.  Wear clean pajamas to bed the night before surgery.  12. Place clean sheets on your bed the night of your first shower and do not sleep with pets.  13. Shower again with the CHG soap on the day of surgery prior to arriving at the hospital.  14. Do not apply any deodorants/lotions/powders.  15. Please wear clean clothes to the hospital.

## 2022-05-08 MED ORDER — CELECOXIB 200 MG PO CAPS
200.0000 mg | ORAL_CAPSULE | ORAL | Status: AC
  Administered 2022-05-09: 200 mg via ORAL

## 2022-05-08 MED ORDER — LACTATED RINGERS IV SOLN: INTRAVENOUS | Status: DC

## 2022-05-08 MED ORDER — CHLORHEXIDINE GLUCONATE CLOTH 2 % EX PADS
6.0000 | MEDICATED_PAD | Freq: Once | CUTANEOUS | Status: AC
  Administered 2022-05-09: 6 via TOPICAL

## 2022-05-08 MED ORDER — FAMOTIDINE 20 MG PO TABS
20.0000 mg | ORAL_TABLET | Freq: Once | ORAL | Status: AC
  Administered 2022-05-09: 20 mg via ORAL

## 2022-05-08 MED ORDER — BUPIVACAINE LIPOSOME 1.3 % IJ SUSP: 20.0000 mL | Freq: Once | INTRAMUSCULAR | Status: DC

## 2022-05-08 MED ORDER — HEPARIN SODIUM (PORCINE) 5000 UNIT/ML IJ SOLN
5000.0000 [IU] | Freq: Once | INTRAMUSCULAR | Status: AC
  Administered 2022-05-09: 5000 [IU] via SUBCUTANEOUS

## 2022-05-08 MED ORDER — CEFAZOLIN SODIUM-DEXTROSE 2-4 GM/100ML-% IV SOLN: 2.0000 g | INTRAVENOUS | Status: DC

## 2022-05-08 MED ORDER — CEFAZOLIN SODIUM-DEXTROSE 2-4 GM/100ML-% IV SOLN
2.0000 g | INTRAVENOUS | Status: AC
  Administered 2022-05-09: 2 g via INTRAVENOUS

## 2022-05-08 MED ORDER — CHLORHEXIDINE GLUCONATE 0.12 % MT SOLN
15.0000 mL | Freq: Once | OROMUCOSAL | Status: AC
  Administered 2022-05-09: 15 mL via OROMUCOSAL

## 2022-05-08 MED ORDER — ACETAMINOPHEN 500 MG PO TABS
1000.0000 mg | ORAL_TABLET | ORAL | Status: AC
  Administered 2022-05-09: 1000 mg via ORAL

## 2022-05-08 MED ORDER — ORAL CARE MOUTH RINSE: 15.0000 mL | Freq: Once | OROMUCOSAL | Status: AC

## 2022-05-08 MED ORDER — GABAPENTIN 300 MG PO CAPS
300.0000 mg | ORAL_CAPSULE | ORAL | Status: AC
  Administered 2022-05-09: 300 mg via ORAL

## 2022-05-09 ENCOUNTER — Ambulatory Visit: Payer: BLUE CROSS/BLUE SHIELD | Admitting: Urgent Care

## 2022-05-09 ENCOUNTER — Observation Stay
Admission: RE | Admit: 2022-05-09 | Discharge: 2022-05-10 | Disposition: A | Discharge status: Home / Self Care | Payer: BLUE CROSS/BLUE SHIELD | Attending: General Surgery | Admitting: General Surgery

## 2022-05-09 ENCOUNTER — Other Ambulatory Visit: Payer: Self-pay

## 2022-05-09 ENCOUNTER — Encounter
Admission: RE | Disposition: A | Discharge status: Home / Self Care | Payer: Self-pay | Source: Home / Self Care | Attending: General Surgery

## 2022-05-09 ENCOUNTER — Ambulatory Visit: Payer: BLUE CROSS/BLUE SHIELD | Admitting: General Practice

## 2022-05-09 ENCOUNTER — Encounter: Payer: Self-pay | Admitting: General Surgery

## 2022-05-09 DIAGNOSIS — Z9013 Acquired absence of bilateral breasts and nipples: Secondary | ICD-10-CM | POA: Diagnosis not present

## 2022-05-09 DIAGNOSIS — Z170 Estrogen receptor positive status [ER+]: Secondary | ICD-10-CM | POA: Diagnosis not present

## 2022-05-09 DIAGNOSIS — C50911 Malignant neoplasm of unspecified site of right female breast: Principal | ICD-10-CM | POA: Insufficient documentation

## 2022-05-09 DIAGNOSIS — Z1501 Genetic susceptibility to malignant neoplasm of breast: Secondary | ICD-10-CM | POA: Insufficient documentation

## 2022-05-09 DIAGNOSIS — Z1509 Genetic susceptibility to other malignant neoplasm: Secondary | ICD-10-CM

## 2022-05-09 DIAGNOSIS — Z421 Encounter for breast reconstruction following mastectomy: Secondary | ICD-10-CM | POA: Diagnosis not present

## 2022-05-09 DIAGNOSIS — Z853 Personal history of malignant neoplasm of breast: Secondary | ICD-10-CM

## 2022-05-09 HISTORY — PX: TOTAL MASTECTOMY: SHX6129

## 2022-05-09 HISTORY — PX: PRIMARY CLOSURE: SHX6224

## 2022-05-09 SURGERY — MASTECTOMY, SIMPLE
Anesthesia: General | Site: Breast | Laterality: Bilateral

## 2022-05-09 MED ORDER — PROPOFOL 10 MG/ML IV BOLUS
INTRAVENOUS | Status: DC | PRN
  Administered 2022-05-09: 150 ug/kg/min via INTRAVENOUS
  Administered 2022-05-09: 150 mg via INTRAVENOUS
  Administered 2022-05-09: 50 mg via INTRAVENOUS

## 2022-05-09 MED ORDER — CARBIDOPA-LEVODOPA 25-100 MG PO TABS
2.0000 | ORAL_TABLET | Freq: Three times a day (TID) | ORAL | Status: DC
  Administered 2022-05-09 – 2022-05-10 (×3): 2 via ORAL
  Filled 2022-05-09 (×3): qty 2

## 2022-05-09 MED ORDER — FAMOTIDINE 20 MG PO TABS
ORAL_TABLET | ORAL | Status: AC
  Filled 2022-05-09: qty 1

## 2022-05-09 MED ORDER — CHLORHEXIDINE GLUCONATE 0.12 % MT SOLN
OROMUCOSAL | Status: AC
  Filled 2022-05-09: qty 15

## 2022-05-09 MED ORDER — ONDANSETRON 4 MG PO TBDP: 4.0000 mg | ORAL_TABLET | Freq: Four times a day (QID) | ORAL | Status: DC | PRN

## 2022-05-09 MED ORDER — LIDOCAINE HCL (CARDIAC) PF 100 MG/5ML IV SOSY
PREFILLED_SYRINGE | INTRAVENOUS | Status: DC | PRN
  Administered 2022-05-09: 80 mg via INTRAVENOUS

## 2022-05-09 MED ORDER — EPHEDRINE 5 MG/ML INJ
INTRAVENOUS | Status: AC
  Filled 2022-05-09: qty 5

## 2022-05-09 MED ORDER — PHENYLEPHRINE 80 MCG/ML (10ML) SYRINGE FOR IV PUSH (FOR BLOOD PRESSURE SUPPORT)
PREFILLED_SYRINGE | INTRAVENOUS | Status: AC
  Filled 2022-05-09: qty 20

## 2022-05-09 MED ORDER — DEXAMETHASONE SODIUM PHOSPHATE 10 MG/ML IJ SOLN
INTRAMUSCULAR | Status: DC | PRN
  Administered 2022-05-09: 10 mg via INTRAVENOUS

## 2022-05-09 MED ORDER — HYDROMORPHONE HCL 1 MG/ML IJ SOLN
INTRAMUSCULAR | Status: DC | PRN
  Administered 2022-05-09: 1 mg via INTRAVENOUS

## 2022-05-09 MED ORDER — PROPOFOL 10 MG/ML IV BOLUS
INTRAVENOUS | Status: AC
  Filled 2022-05-09: qty 20

## 2022-05-09 MED ORDER — SODIUM CHLORIDE (PF) 0.9 % IJ SOLN
INTRAMUSCULAR | Status: AC
  Filled 2022-05-09: qty 50

## 2022-05-09 MED ORDER — ENOXAPARIN SODIUM 40 MG/0.4ML IJ SOSY
40.0000 mg | PREFILLED_SYRINGE | INTRAMUSCULAR | Status: DC
  Filled 2022-05-09: qty 0.4

## 2022-05-09 MED ORDER — VITAMIN D3 25 MCG (1000 UNIT) PO TABS
5000.0000 [IU] | ORAL_TABLET | Freq: Every day | ORAL | Status: DC
  Filled 2022-05-09: qty 5

## 2022-05-09 MED ORDER — ACETAMINOPHEN 500 MG PO TABS
ORAL_TABLET | ORAL | Status: AC
  Filled 2022-05-09: qty 2

## 2022-05-09 MED ORDER — DEXAMETHASONE SODIUM PHOSPHATE 10 MG/ML IJ SOLN
INTRAMUSCULAR | Status: AC
  Filled 2022-05-09: qty 4

## 2022-05-09 MED ORDER — EPINEPHRINE PF 1 MG/ML IJ SOLN
INTRAMUSCULAR | Status: AC
  Filled 2022-05-09: qty 1

## 2022-05-09 MED ORDER — CELECOXIB 200 MG PO CAPS
200.0000 mg | ORAL_CAPSULE | Freq: Two times a day (BID) | ORAL | Status: DC
  Administered 2022-05-09: 200 mg via ORAL
  Filled 2022-05-09 (×2): qty 1

## 2022-05-09 MED ORDER — FLUTICASONE PROPIONATE 50 MCG/ACT NA SUSP: 1.0000 | Freq: Every day | NASAL | Status: DC | PRN

## 2022-05-09 MED ORDER — MIDAZOLAM HCL 2 MG/2ML IJ SOLN
INTRAMUSCULAR | Status: DC | PRN
  Administered 2022-05-09: 2 mg via INTRAVENOUS

## 2022-05-09 MED ORDER — BUPIVACAINE-EPINEPHRINE (PF) 0.5% -1:200000 IJ SOLN
INTRAMUSCULAR | Status: DC | PRN
  Administered 2022-05-09: 25 mL

## 2022-05-09 MED ORDER — GABAPENTIN 300 MG PO CAPS
300.0000 mg | ORAL_CAPSULE | Freq: Two times a day (BID) | ORAL | Status: DC
  Administered 2022-05-09: 300 mg via ORAL
  Filled 2022-05-09: qty 1

## 2022-05-09 MED ORDER — ONDANSETRON HCL 4 MG/2ML IJ SOLN: 4.0000 mg | Freq: Four times a day (QID) | INTRAMUSCULAR | Status: DC | PRN

## 2022-05-09 MED ORDER — METOPROLOL TARTRATE 5 MG/5ML IV SOLN
INTRAVENOUS | Status: AC
  Filled 2022-05-09: qty 10

## 2022-05-09 MED ORDER — HYDROCODONE-ACETAMINOPHEN 5-325 MG PO TABS: 1.0000 | ORAL_TABLET | ORAL | Status: DC | PRN

## 2022-05-09 MED ORDER — GABAPENTIN 300 MG PO CAPS
ORAL_CAPSULE | ORAL | Status: AC
  Filled 2022-05-09: qty 1

## 2022-05-09 MED ORDER — FENTANYL CITRATE (PF) 100 MCG/2ML IJ SOLN
INTRAMUSCULAR | Status: AC
  Filled 2022-05-09: qty 2

## 2022-05-09 MED ORDER — LABETALOL HCL 5 MG/ML IV SOLN
INTRAVENOUS | Status: DC | PRN
  Administered 2022-05-09 (×4): 5 mg via INTRAVENOUS

## 2022-05-09 MED ORDER — CEFAZOLIN SODIUM-DEXTROSE 2-4 GM/100ML-% IV SOLN
INTRAVENOUS | Status: AC
  Filled 2022-05-09: qty 100

## 2022-05-09 MED ORDER — SUCCINYLCHOLINE CHLORIDE 200 MG/10ML IV SOSY
PREFILLED_SYRINGE | INTRAVENOUS | Status: DC | PRN
  Administered 2022-05-09: 100 mg via INTRAVENOUS

## 2022-05-09 MED ORDER — LABETALOL HCL 5 MG/ML IV SOLN
INTRAVENOUS | Status: AC
  Filled 2022-05-09: qty 4

## 2022-05-09 MED ORDER — MORPHINE SULFATE (PF) 4 MG/ML IV SOLN: 4.0000 mg | INTRAVENOUS | Status: DC | PRN

## 2022-05-09 MED ORDER — OXYCODONE HCL 5 MG/5ML PO SOLN: 5.0000 mg | Freq: Once | ORAL | Status: DC | PRN

## 2022-05-09 MED ORDER — TAMOXIFEN CITRATE 10 MG PO TABS
20.0000 mg | ORAL_TABLET | Freq: Every day | ORAL | Status: DC
  Filled 2022-05-09: qty 2

## 2022-05-09 MED ORDER — MIDAZOLAM HCL 2 MG/2ML IJ SOLN
INTRAMUSCULAR | Status: AC
  Filled 2022-05-09: qty 2

## 2022-05-09 MED ORDER — BUPIVACAINE LIPOSOME 1.3 % IJ SUSP
INTRAMUSCULAR | Status: AC
  Filled 2022-05-09: qty 20

## 2022-05-09 MED ORDER — PROPOFOL 1000 MG/100ML IV EMUL
INTRAVENOUS | Status: AC
  Filled 2022-05-09: qty 100

## 2022-05-09 MED ORDER — DEXMEDETOMIDINE HCL IN NACL 80 MCG/20ML IV SOLN
INTRAVENOUS | Status: DC | PRN
  Administered 2022-05-09 (×2): 8 ug via INTRAVENOUS
  Administered 2022-05-09: 4 ug via INTRAVENOUS

## 2022-05-09 MED ORDER — CELECOXIB 200 MG PO CAPS
ORAL_CAPSULE | ORAL | Status: AC
  Filled 2022-05-09: qty 1

## 2022-05-09 MED ORDER — MESALAMINE ER 250 MG PO CPCR
250.0000 mg | ORAL_CAPSULE | Freq: Every day | ORAL | Status: DC
  Filled 2022-05-09: qty 1

## 2022-05-09 MED ORDER — OXYCODONE HCL 5 MG PO TABS: 5.0000 mg | ORAL_TABLET | Freq: Once | ORAL | Status: DC | PRN

## 2022-05-09 MED ORDER — ONDANSETRON HCL 4 MG/2ML IJ SOLN
INTRAMUSCULAR | Status: DC | PRN
  Administered 2022-05-09: 4 mg via INTRAVENOUS

## 2022-05-09 MED ORDER — METHOCARBAMOL 500 MG PO TABS: 500.0000 mg | ORAL_TABLET | Freq: Four times a day (QID) | ORAL | Status: DC | PRN

## 2022-05-09 MED ORDER — HEPARIN SODIUM (PORCINE) 5000 UNIT/ML IJ SOLN
INTRAMUSCULAR | Status: AC
  Filled 2022-05-09: qty 1

## 2022-05-09 MED ORDER — BUPIVACAINE HCL (PF) 0.5 % IJ SOLN
INTRAMUSCULAR | Status: AC
  Filled 2022-05-09: qty 30

## 2022-05-09 MED ORDER — FENTANYL CITRATE (PF) 100 MCG/2ML IJ SOLN
INTRAMUSCULAR | Status: DC | PRN
  Administered 2022-05-09: 50 ug via INTRAVENOUS

## 2022-05-09 MED ORDER — METOPROLOL SUCCINATE ER 25 MG PO TB24: 25.0000 mg | ORAL_TABLET | Freq: Every day | ORAL | Status: DC

## 2022-05-09 MED ORDER — FENTANYL CITRATE (PF) 100 MCG/2ML IJ SOLN
25.0000 ug | INTRAMUSCULAR | Status: DC | PRN
  Administered 2022-05-09 (×4): 25 ug via INTRAVENOUS

## 2022-05-09 MED ORDER — VITAMIN B-12 1000 MCG PO TABS: 500.0000 ug | ORAL_TABLET | Freq: Every day | ORAL | Status: DC

## 2022-05-09 MED ORDER — ONDANSETRON HCL 4 MG/2ML IJ SOLN
INTRAMUSCULAR | Status: AC
  Filled 2022-05-09: qty 8

## 2022-05-09 MED ORDER — HYDROMORPHONE HCL 1 MG/ML IJ SOLN
INTRAMUSCULAR | Status: AC
  Filled 2022-05-09: qty 1

## 2022-05-09 MED ORDER — SODIUM CHLORIDE 0.9 % IV SOLN: INTRAVENOUS | Status: DC

## 2022-05-09 MED ORDER — ONDANSETRON HCL 4 MG/2ML IJ SOLN: 4.0000 mg | Freq: Once | INTRAMUSCULAR | Status: DC | PRN

## 2022-05-09 SURGICAL SUPPLY — 80 items
ADH SKN CLS APL DERMABOND .7 (GAUZE/BANDAGES/DRESSINGS) ×8
APL PRP STRL LF DISP 70% ISPRP (MISCELLANEOUS) ×4
APPLIER CLIP 11 MED OPEN (CLIP)
APPLIER CLIP 9.375 SM OPEN (CLIP)
APR CLP MED 11 20 MLT OPN (CLIP)
APR CLP SM 9.3 20 MLT OPN (CLIP)
BINDER BREAST LRG (GAUZE/BANDAGES/DRESSINGS) IMPLANT
BIOPATCH WHT 1IN DISK W/4.0 H (GAUZE/BANDAGES/DRESSINGS) IMPLANT
BLADE BOVIE TIP EXT 4 (BLADE) ×2 IMPLANT
BLADE SURG 15 STRL LF DISP TIS (BLADE) ×4 IMPLANT
BLADE SURG 15 STRL SS (BLADE) ×2
BLADE SURG SZ10 CARB STEEL (BLADE) ×2 IMPLANT
BULB RESERV EVAC DRAIN JP 100C (MISCELLANEOUS) IMPLANT
CHLORAPREP W/TINT 26 (MISCELLANEOUS) ×2 IMPLANT
CLIP APPLIE 11 MED OPEN (CLIP) IMPLANT
CLIP APPLIE 9.375 SM OPEN (CLIP) IMPLANT
CNTNR URN SCR LID CUP LEK RST (MISCELLANEOUS) ×2 IMPLANT
CONT SPEC 4OZ STRL OR WHT (MISCELLANEOUS)
COVER PROBE GAMMA FINDER SLV (MISCELLANEOUS) ×2 IMPLANT
DERMABOND ADVANCED .7 DNX12 (GAUZE/BANDAGES/DRESSINGS) ×8 IMPLANT
DEVICE DUBIN SPECIMEN MAMMOGRA (MISCELLANEOUS) ×2 IMPLANT
DRAIN CHANNEL 19F RND (DRAIN) IMPLANT
DRAIN CHANNEL JP 19F (MISCELLANEOUS) IMPLANT
DRAPE CHEST BREAST 77X106 FENE (MISCELLANEOUS) ×2 IMPLANT
DRAPE LAPAROTOMY TRNSV 106X77 (MISCELLANEOUS) ×2 IMPLANT
DRSG GAUZE FLUFF 36X18 (GAUZE/BANDAGES/DRESSINGS) ×2 IMPLANT
DRSG MEPILEX FLEX 3X3 (GAUZE/BANDAGES/DRESSINGS) IMPLANT
DRSG MEPILEX FLEX 6X6 (GAUZE/BANDAGES/DRESSINGS) IMPLANT
DRSG TEGADERM 2-3/8X2-3/4 SM (GAUZE/BANDAGES/DRESSINGS) IMPLANT
ELECT CAUTERY BLADE TIP 2.5 (TIP) ×2
ELECT REM PT RETURN 9FT ADLT (ELECTROSURGICAL) ×2
ELECTRODE CAUTERY BLDE TIP 2.5 (TIP) IMPLANT
ELECTRODE REM PT RTRN 9FT ADLT (ELECTROSURGICAL) ×4 IMPLANT
GAUZE 4X4 16PLY ~~LOC~~+RFID DBL (SPONGE) ×4 IMPLANT
GAUZE SPONGE 4X4 12PLY STRL (GAUZE/BANDAGES/DRESSINGS) ×4 IMPLANT
GLOVE BIO SURGEON STRL SZ 6.5 (GLOVE) ×12 IMPLANT
GLOVE BIOGEL PI IND STRL 6.5 (GLOVE) ×2 IMPLANT
GOWN STRL REUS W/ TWL LRG LVL3 (GOWN DISPOSABLE) ×8 IMPLANT
GOWN STRL REUS W/TWL LRG LVL3 (GOWN DISPOSABLE) ×6
GOWN STRL REUS W/TWL XL LVL3 (GOWN DISPOSABLE) IMPLANT
JACKSON PRATT 10 (INSTRUMENTS) ×2 IMPLANT
KIT TURNOVER KIT A (KITS) ×2 IMPLANT
LABEL OR SOLS (LABEL) ×2 IMPLANT
MANIFOLD NEPTUNE II (INSTRUMENTS) ×4 IMPLANT
NDL FILTER BLUNT 18X1 1/2 (NEEDLE) ×2 IMPLANT
NDL HYPO 22X1.5 SAFETY MO (MISCELLANEOUS) ×4 IMPLANT
NEEDLE FILTER BLUNT 18X1 1/2 (NEEDLE) ×2 IMPLANT
NEEDLE HYPO 22X1.5 SAFETY MO (MISCELLANEOUS) ×2 IMPLANT
NS IRRIG 1000ML POUR BTL (IV SOLUTION) ×2 IMPLANT
PACK BASIN MAJOR ARMC (MISCELLANEOUS) ×4 IMPLANT
PAD ABD DERMACEA PRESS 5X9 (GAUZE/BANDAGES/DRESSINGS) ×4 IMPLANT
SOL PREP PVP 2OZ (MISCELLANEOUS)
SOLUTION PREP PVP 2OZ (MISCELLANEOUS) ×2 IMPLANT
SPIKE FLUID TRANSFER (MISCELLANEOUS) ×2 IMPLANT
SPONGE T-LAP 18X18 ~~LOC~~+RFID (SPONGE) ×12 IMPLANT
STRIP CLOSURE SKIN 1/2X4 (GAUZE/BANDAGES/DRESSINGS) ×2 IMPLANT
SUT ETHILON 3-0 FS-10 30 BLK (SUTURE)
SUT MNCRL 3-0 UNDYED SH (SUTURE) ×4 IMPLANT
SUT MNCRL 4-0 (SUTURE) ×6
SUT MNCRL 4-0 27XMFL (SUTURE) ×6
SUT MNCRL+ 5-0 UNDYED PC-3 (SUTURE) ×4 IMPLANT
SUT MON AB 3-0 SH 27 (SUTURE) ×4 IMPLANT
SUT MON AB 5-0 P3 18 (SUTURE) ×4 IMPLANT
SUT PDS II 3-0 (SUTURE) IMPLANT
SUT PDS PLUS AB 2-0 CT-1 (SUTURE) IMPLANT
SUT SILK 2 0 SH (SUTURE) ×2 IMPLANT
SUT SILK 3-0 (SUTURE) ×2 IMPLANT
SUT VIC AB 3-0 54X BRD REEL (SUTURE) ×2 IMPLANT
SUT VIC AB 3-0 BRD 54 (SUTURE) ×2
SUT VIC AB 3-0 SH 27 (SUTURE)
SUT VIC AB 3-0 SH 27X BRD (SUTURE) IMPLANT
SUT VIC AB 3-0 SH 8-18 (SUTURE) ×2 IMPLANT
SUTURE EHLN 3-0 FS-10 30 BLK (SUTURE) ×2 IMPLANT
SUTURE MNCRL 4-0 27XMF (SUTURE) ×8 IMPLANT
SYR 10ML LL (SYRINGE) ×4 IMPLANT
SYR BULB IRRIG 60ML STRL (SYRINGE) ×4 IMPLANT
TOWEL OR 17X26 4PK STRL BLUE (TOWEL DISPOSABLE) ×4 IMPLANT
TRAP FLUID SMOKE EVACUATOR (MISCELLANEOUS) ×4 IMPLANT
WATER STERILE IRR 1000ML POUR (IV SOLUTION) ×2 IMPLANT
WATER STERILE IRR 500ML POUR (IV SOLUTION) ×4 IMPLANT

## 2022-05-09 NOTE — Interval H&P Note (Signed)
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  The various methods of treatment have been discussed with the patient and family. After consideration of risks, benefits and other options for treatment, the patient has consented to  Procedure(s): TOTAL MASTECTOMY (Bilateral) ADVANCED CLOSURE (Bilateral) as a surgical intervention.  The patient's history has been reviewed, patient examined, no change in status, stable for surgery.  I have reviewed the patient's chart and labs.  Questions were answered to the patient's satisfaction.     Carolan Shiver

## 2022-05-09 NOTE — Op Note (Addendum)
DATE OF OPERATION: 05/09/2022  LOCATION: [facility] Operating Room  PREOPERATIVE DIAGNOSIS: Right Breast Cancer  POSTOPERATIVE DIAGNOSIS: Same  PROCEDURE: Primary closure of bilateral breasts after mastectomy with layered closure 10 cm bilaterally  SURGEON: Dailan Pfalzgraf, DO  EBL: None  CONDITION: Stable  COMPLICATIONS: None  INDICATION: The patient, Kelly Munoz, is a 62 y.o. female born on Mar 24, 1960, is here for treatment of right breast cancer and BRCA positive genes.   PROCEDURE DETAILS:  The patient was seen prior to surgery and marked by the general surgery team.  The IV antibiotics were given. The patient was taken to the operating room and given a general anesthetic. A standard time out was performed and all information was confirmed by those in the room. SCDs were placed.   The patient underwent bilateral mastectomies with the left side first.  When the mastectomies were complete the patient was rendered to the plastic surgery team.    Left Breast:  Hemostasis was achieved with electrocautery.  Experel was placed in the fascia. A drain was placed and secured to the chest wall with the 4-0 Silk.  The deep layer was closed with the 3-0 PDS for the 10 cm wound..  The subcuticular layer was closed with the 3-0 Monocryl.  Several 4-0 Monocryl were placed in the skin.  Derma bond and steri strips were applied with a sterile dressing.  Right Breast: Hemostasis was achieved with electrocautery. Experel was placed in the fascia.  A drain was placed and secured to the chest wall with the 4-0 Silk.  The deep layer was closed with the 3-0 PDS.  The 10 cm wound subcuticular layer was closed with the 3-0 Monocryl.  Several 4-0 Monocryl were placed in the skin.  Derma bond and steri strips were applied with a sterile dressing. The patient was allowed to wake up and taken to recovery room in stable condition at the end of the case. The family was notified at the end of the case.

## 2022-05-09 NOTE — Anesthesia Postprocedure Evaluation (Signed)
Anesthesia Post Note  Patient: Kelly Munoz  Procedure(s) Performed: TOTAL MASTECTOMY (Bilateral: Breast) ADVANCED CLOSURE (Bilateral)  Patient location during evaluation: PACU Anesthesia Type: General Level of consciousness: awake and alert Pain management: pain level controlled Vital Signs Assessment: post-procedure vital signs reviewed and stable Respiratory status: spontaneous breathing, nonlabored ventilation, respiratory function stable and patient connected to nasal cannula oxygen Cardiovascular status: blood pressure returned to baseline and stable Postop Assessment: no apparent nausea or vomiting Anesthetic complications: no  There were no known notable events for this encounter.   Last Vitals:  Vitals:   05/09/22 1215 05/09/22 1228  BP: (!) 150/76 (!) 140/85  Pulse: 83 87  Resp:  18  Temp:  (!) 36.3 C  SpO2: 93% 97%    Last Pain:  Vitals:   05/09/22 1228  TempSrc: Oral  PainSc:                  Stephanie Coup

## 2022-05-09 NOTE — Anesthesia Procedure Notes (Signed)
Procedure Name: Intubation Date/Time: 05/09/2022 7:51 AM  Performed by: Maryla Morrow., CRNAPre-anesthesia Checklist: Patient identified, Patient being monitored, Timeout performed, Emergency Drugs available and Suction available Patient Re-evaluated:Patient Re-evaluated prior to induction Oxygen Delivery Method: Circle system utilized Preoxygenation: Pre-oxygenation with 100% oxygen Induction Type: IV induction Ventilation: Mask ventilation without difficulty Laryngoscope Size: McGraph and 4 Grade View: Grade I Tube type: Oral Tube size: 6.5 mm Number of attempts: 1 Airway Equipment and Method: Stylet Placement Confirmation: ETT inserted through vocal cords under direct vision, positive ETCO2 and breath sounds checked- equal and bilateral Secured at: 18 cm Tube secured with: Tape Dental Injury: Teeth and Oropharynx as per pre-operative assessment

## 2022-05-09 NOTE — Plan of Care (Signed)

## 2022-05-09 NOTE — Interval H&P Note (Signed)
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  The various methods of treatment have been discussed with the patient and family. After consideration of risks, benefits and other options for treatment, the patient has consented to  Procedure(s): TOTAL MASTECTOMY (Bilateral) ADVANCED CLOSURE (Bilateral) as a surgical intervention.  The patient's history has been reviewed, patient examined, no change in status, stable for surgery.  I have reviewed the patient's chart and labs.  Questions were answered to the patient's satisfaction.     Alena Bills Vincenzo Stave

## 2022-05-09 NOTE — Progress Notes (Signed)
  Transition of Care [facility]) Screening Note   Patient Details  Name: Kelly Munoz Date of Birth: 1960/10/16   [facility]) CM/SW Contact:    Chapman Fitch, RN Phone Number: 05/09/2022, 4:13 PM    Transition of Care Department [facility]) has reviewed patient and no TOC needs have been identified at this time. We will continue to monitor patient advancement through interdisciplinary progression rounds. If new patient transition needs arise, please place a TOC consult.  If patient to dc with JP in place will need to be provided education prior to discharge

## 2022-05-09 NOTE — Transfer of Care (Signed)
2Immediate Anesthesia Transfer of Care Note  Patient: Kelly Munoz  Procedure(s) Performed: TOTAL MASTECTOMY (Bilateral: Breast) ADVANCED CLOSURE (Bilateral)  Patient Location: PACU  Anesthesia Type:General  Level of Consciousness: drowsy and patient cooperative  Airway & Oxygen Therapy: Patient Spontanous Breathing and Patient connected to face mask oxygen  Post-op Assessment: Report given to RN and Post -op Vital signs reviewed and stable  Post vital signs: stable  Last Vitals:  Vitals Value Taken Time  BP 131/59 05/09/22 0958  Temp 36.4 C 05/09/22 0958  Pulse 89 05/09/22 0959  Resp 15 05/09/22 0959  SpO2 98 % 05/09/22 0959  Vitals shown include unvalidated device data.  Last Pain:  Vitals:   05/09/22 0618  TempSrc: Temporal  PainSc: 0-No pain         Complications: No notable events documented.

## 2022-05-09 NOTE — Op Note (Signed)
Preoperative diagnosis: BRCA2 genetic mutation positve  Postoperative diagnosis: Same.   Procedure: Bilateral mastectomies  Anesthesia: GETA  Surgeon: Dr. Hazle Quant  Wound Classification: Clean  Indications: Patient is a 62 y.o. female with history of breast cancer was found to be BRCA2 positive.  It was elected to proceed with bilateral mastectomy for prophylactic breast cancer reducing option.  Findings: 1.  Significant scar tissue mostly on the right breast due to previous partial mastectomy, breast reduction and radiation therapy  Description of procedure: The patient was brought to the operating room and general anesthesia was induced. A time-out was completed verifying correct patient, procedure, site, positioning, and implant(s) and/or special equipment prior to beginning this procedure. The breast, chest wall, axilla, and upper arm and neck were prepped and draped in the usual sterile fashion.  Mastectomy on the right and the left side were done using the following technique: A skin incision was made that encompassed the nipple-areola complex and passed in an oblique direction across the breast. Flaps were raised in the avascular plane between subcutaneous tissue and breast tissue from the clavicle superiorly, the sternum medially, the anterior rectus sheath inferiorly, and past the lateral border of the pectoralis major muscle laterally. Hemostasis was achieved in the flaps. Next, the breast tissue and underlying pectoralis fascia were excised from the pectoralis major muscle, progressing from medially to laterally. At the lateral border of the pectoralis major muscle, the breast tissue was swung laterally and a lateral pedicle identified where breast tissue gave way to fat of axilla. The lateral pedicle was incised and the specimen removed.  The wound was irrigated and hemostasis was achieved.  Wound left open for plastic and reconstructive surgery layer closure for flat aesthetic  closure.  Specimen: Left Breast                    Right Breast   Complications: None  Estimated Blood Loss: 25 mL

## 2022-05-09 NOTE — Anesthesia Preprocedure Evaluation (Addendum)
Anesthesia Evaluation  Patient identified by MRN, date of birth, ID band Patient awake    Reviewed: Allergy & Precautions, NPO status , Patient's Chart, lab work & pertinent test results  History of Anesthesia Complications (+) PONV and history of anesthetic complications  Airway Mallampati: II  TM Distance: >3 FB Neck ROM: Full    Dental no notable dental hx. (+) Teeth Intact, Dental Advidsory Given   Pulmonary neg pulmonary ROS, neg sleep apnea, neg COPD, Patient abstained from smoking.Not current smoker   Pulmonary exam normal breath sounds clear to auscultation       Cardiovascular Exercise Tolerance: Good METShypertension, Pt. on medications (-) CAD and (-) Past MI (-) dysrhythmias  Rhythm:Regular Rate:Normal - Systolic murmurs    Neuro/Psych  Neuromuscular disease  negative psych ROS   GI/Hepatic ,neg GERD  ,,(+)     (-) substance abuse  , Hepatitis -  Endo/Other  neg diabetes    Renal/GU negative Renal ROS     Musculoskeletal   Abdominal   Peds  Hematology   Anesthesia Other Findings Past Medical History: 02/10/2017: Abnormal LFTs (liver function tests) 07/07/2017: Anal fissure No date: Breast cancer (HCC) 06/08/2011: Breast cancer, right (HCC) No date: Family history of breast cancer No date: Family history of colon cancer No date: Family history of prostate cancer No date: History of IBS No date: History of kidney stones No date: Hypertension 08/19/2019: Lymphocytosis 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) 2020: Parkinson disease (HCC) 2013: Personal history of chemotherapy     Comment:  RIGHT lumpectomy 2013: Personal history of radiation therapy     Comment:  RIGHT lumpectomy  Reproductive/Obstetrics                             Anesthesia Physical Anesthesia Plan  ASA: 2  Anesthesia Plan: General ETT   Post-op Pain Management: Ofirmev IV  (intra-op)* and Dilaudid IV   Induction: Intravenous  PONV Risk Score and Plan: 4 or greater and Ondansetron, Midazolam and Dexamethasone  Airway Management Planned: Oral ETT  Additional Equipment: None  Intra-op Plan:   Post-operative Plan: Extubation in OR  Informed Consent: I have reviewed the patients History and Physical, chart, labs and discussed the procedure including the risks, benefits and alternatives for the proposed anesthesia with the patient or authorized representative who has indicated his/her understanding and acceptance.     Dental Advisory Given  Plan Discussed with: Anesthesiologist, CRNA and Surgeon  Anesthesia Plan Comments: (Patient consented for risks of anesthesia including but not limited to:  - adverse reactions to medications - damage to eyes, teeth, lips or other oral mucosa - nerve damage due to positioning  - sore throat or hoarseness - Damage to heart, brain, nerves, lungs, other parts of body or loss of life  Patient voiced understanding.)        Anesthesia Quick Evaluation

## 2022-05-10 ENCOUNTER — Encounter: Payer: Self-pay | Admitting: General Surgery

## 2022-05-10 DIAGNOSIS — C50911 Malignant neoplasm of unspecified site of right female breast: Secondary | ICD-10-CM | POA: Diagnosis not present

## 2022-05-10 LAB — BASIC METABOLIC PANEL
Anion gap: 7 (ref 5–15)
BUN: 11 mg/dL (ref 6–20)
CO2: 24 mmol/L (ref 22–32)
Calcium: 9.5 mg/dL (ref 8.9–10.3)
Chloride: 108 mmol/L (ref 98–111)
Creatinine, Ser: 0.54 mg/dL (ref 0.44–1.00)
GFR, Estimated: >60 mL/min (ref >60)
Glucose, Bld: 109 mg/dL — ABNORMAL HIGH (ref 70–99)
Potassium: 4.1 mmol/L (ref 3.5–5.1)
Sodium: 139 mmol/L (ref 135–145)

## 2022-05-10 LAB — CBC
HCT: 39.6 % (ref 36.0–46.0)
Hemoglobin: 12.9 g/dL (ref 12.0–15.0)
MCH: 30.1 pg (ref 26.0–34.0)
MCHC: 32.6 g/dL (ref 30.0–36.0)
MCV: 92.3 fL (ref 80.0–100.0)
Platelets: 203 10*3/uL (ref 150–400)
RBC: 4.29 MIL/uL (ref 3.87–5.11)
RDW: 12.8 % (ref 11.5–15.5)
WBC: 15.4 10*3/uL — ABNORMAL HIGH (ref 4.0–10.5)
nRBC: 0 % (ref 0.0–0.2)

## 2022-05-10 MED ORDER — METHOCARBAMOL 500 MG PO TABS: 500.0000 mg | ORAL_TABLET | Freq: Three times a day (TID) | ORAL | 0 refills | Status: AC

## 2022-05-10 MED ORDER — HYDROCODONE-ACETAMINOPHEN 5-325 MG PO TABS
1.0000 | ORAL_TABLET | ORAL | 0 refills | Status: AC | PRN
Start: 2022-05-10 — End: ?

## 2022-05-10 NOTE — Discharge Summary (Signed)
Patient ID: Kelly Munoz MRN: [id number] DOB/AGE: June 27, 1960 62 y.o.  Admit date: 05/09/2022 Discharge date: 05/10/2022   Discharge Diagnoses:  Principal Problem:   BRCA gene mutation positive   Procedures: Bilateral prophylactic mastectomy  Hospital Course: Patient with history of BRCA2 positive.  She had bilateral prophylactic mastectomy with flat anesthetic closure.  This morning the patient has been recovering adequately.  The pain is under control.  The wounds are dry and clean.  No sign of hematoma or ischemic tissue.  Drains with appropriate output.  Physical Exam Vitals reviewed.  HENT:     Head: Normocephalic.  Cardiovascular:     Rate and Rhythm: Normal rate and regular rhythm.     Pulses: Normal pulses.  Pulmonary:     Effort: Pulmonary effort is normal.  Chest:  Breasts:    Right: Absent.     Left: Absent.  Abdominal:     General: Abdomen is flat.  Musculoskeletal:     Cervical back: Normal range of motion.  Neurological:     Mental Status: She is alert and oriented to person, place, and time.      Consults: None  Disposition: Discharge disposition: 01-Home or Self Care       Discharge Instructions     Diet - low sodium heart healthy   Complete by: As directed    Increase activity slowly   Complete by: As directed       Allergies as of 05/10/2022       Reactions   Cat Hair Extract Other (See Comments)   Sneezing,watery eyes, throat closes   Zithromax [azithromycin] Diarrhea   Clindamycin/lincomycin Rash   Erythromycin Rash        Medication List     TAKE these medications    Apriso 0.375 g 24 hr capsule Generic drug: mesalamine TAKE 1 CAPSULE BY MOUTH IN THE  MORNING AND AT BEDTIME What changed: See the new instructions.   carbidopa-levodopa 25-100 MG tablet Commonly known as: SINEMET IR Take 2 tablets by mouth 3 (three) times daily. Take as directed.   cyanocobalamin 500 MCG tablet Commonly known as: VITAMIN  B12 Take 500 mcg by mouth daily.   FISH OIL-KRILL OIL PO Take 1 tablet by mouth daily.   fluticasone 50 MCG/ACT nasal spray Commonly known as: FLONASE Place 1-2 sprays into both nostrils daily as needed for allergies or rhinitis.   HYDROcodone-acetaminophen 5-325 MG tablet Commonly known as: NORCO/VICODIN Take 1 tablet by mouth every 4 (four) hours as needed for moderate pain.   lidocaine 5 % Commonly known as: LIDODERM 1 patch as needed.   methocarbamol 500 MG tablet Commonly known as: ROBAXIN Take 1 tablet (500 mg total) by mouth 3 (three) times daily for 10 days.   metoprolol succinate 25 MG 24 hr tablet Commonly known as: TOPROL-XL Take 25 mg by mouth daily.   Pfizer COVID-19 Vac Bivalent injection Generic drug: COVID-19 mRNA bivalent vaccine Proofreader) Inject into the muscle.   tamoxifen 20 MG tablet Commonly known as: NOLVADEX Take 1 tablet by mouth daily.   tretinoin 0.05 % cream Commonly known as: RETIN-A 1 application  as needed.   Vitamin D3 125 MCG (5000 UT) Caps Take 5,000 Units by mouth daily.        Follow-up Information     Carolan Shiver, MD Follow up in 1 week(s).   Specialty: General Surgery Why: For wound re-check Contact information: [address] [place] [address] [phone]

## 2022-05-10 NOTE — Discharge Instructions (Signed)

## 2022-05-11 LAB — SURGICAL PATHOLOGY

## 2022-05-27 ENCOUNTER — Other Ambulatory Visit: Payer: Self-pay | Admitting: General Surgery

## 2022-05-27 DIAGNOSIS — Z1501 Genetic susceptibility to malignant neoplasm of breast: Secondary | ICD-10-CM

## 2022-05-30 ENCOUNTER — Other Ambulatory Visit: Payer: Self-pay | Admitting: Oncology

## 2022-05-30 NOTE — Telephone Encounter (Signed)
Dr yu patient

## 2022-06-03 ENCOUNTER — Ambulatory Visit: Payer: BLUE CROSS/BLUE SHIELD | Attending: General Surgery | Admitting: Occupational Therapy

## 2022-06-03 ENCOUNTER — Encounter: Payer: Self-pay | Admitting: Occupational Therapy

## 2022-06-03 DIAGNOSIS — Z9013 Acquired absence of bilateral breasts and nipples: Secondary | ICD-10-CM | POA: Insufficient documentation

## 2022-06-03 NOTE — Therapy (Signed)
[facility] [address] [place], [place], [address] Phone: [phone]   Fax:  [phone]  Occupational Therapy Evaluation  Patient Details  Name: Kelly Munoz MRN: [id number] Date of Birth: 1960-02-01 Referring Provider (OT): DR Maia Plan   Encounter Date: 06/03/2022   OT End of Session - 06/03/22 2050     Visit Number 1    Number of Visits 6    Date for OT Re-Evaluation 07/29/22    OT Start Time 0901    OT Stop Time 0945    OT Time Calculation (min) 44 min    Activity Tolerance Patient tolerated treatment well    Behavior During Therapy [facility] for tasks assessed/performed             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    Breast cancer (HCC)    Breast cancer, right (HCC) 06/08/2011   Crohn's disease (HCC)    Family history of breast cancer    Family history of colon cancer    Family history of prostate cancer    Heart murmur    History of IBS    History of kidney stones    Hypertension    Lymphocytosis 08/19/2019   Mixed hyperlipidemia    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(HCC)    Nonalcoholic steatohepatitis (NASH)    Parkinson disease 2020   Parkinsonism    Personal history of chemotherapy 2013   RIGHT lumpectomy   Personal history of radiation therapy 2013   RIGHT lumpectomy   PONV (postoperative nausea and vomiting)    nausea   Raynaud's disease    Skin cancer     Past Surgical History:  Procedure Laterality Date   ABDOMINAL HYSTERECTOMY Bilateral    oophorectomy   BREAST BIOPSY Right 2013   lumpectomy chem and rad   BREAST LUMPECTOMY Right 2013   COLONOSCOPY WITH PROPOFOL N/A 02/06/2017   Procedure: COLONOSCOPY WITH PROPOFOL;  Surgeon: Christena Deem, MD;  Location: [facility] ENDOSCOPY;  Service: Endoscopy;  Laterality: N/A;   COLONOSCOPY WITH PROPOFOL N/A 08/02/2021    Procedure: COLONOSCOPY WITH PROPOFOL;  Surgeon: Toney Reil, MD;  Location: [facility] ENDOSCOPY;  Service: Gastroenterology;  Laterality: N/A;   CYSTOSCOPY N/A 11/09/2015   Procedure: CYSTOSCOPY;  Surgeon: Elenora Fender Ward, MD;  Location: [facility] ORS;  Service: Gynecology;  Laterality: N/A;   parotid stone      Removal   parotid stones Left    PRIMARY CLOSURE Bilateral 05/09/2022   Procedure: ADVANCED CLOSURE;  Surgeon: Peggye Form, DO;  Location: [facility] ORS;  Service: Plastics;  Laterality: Bilateral;   REDUCTION MAMMAPLASTY Bilateral 02/2013   TONSILLECTOMY     TOTAL MASTECTOMY Bilateral 05/09/2022   Procedure: TOTAL MASTECTOMY;  Surgeon: Carolan Shiver, MD;  Location: [facility] ORS;  Service: General;  Laterality: Bilateral;    There were no vitals filed for this visit.   Subjective Assessment - 06/03/22 2045     Pertinent History DR Maia Plan note from 05/26/22 - CHEST: Right breast without any significant fluid. No swelling. Great range of motion of the right upper extremity. Left chest with small seroma in the medial aspect. Also small amount of fluid on the lateral aspect. No erythema. Good range of motion but still limited.   IMPRESSION:  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 (Z15.01, Z15.09) -S/p bilateral mastectomies with flat aesthetic closure done 05/09/22 -Small seroma of  the left chest, currently asymptomatic. Will continue with observation. -Patient with significant improvement of range of motion and getting more comfortable -Will start outpatient referral for exertional therapy  PLAN: 1. Continue the great care you are doing 2. Outpatient referral to Occupational Therapy 3. Can increase activity slowly as tolerated 4. I will see you in 2 weeks for wound follow-up but contact us if you have any concern    Patient Stated Goals Want to get my motion in my shoulder better that I can return to do all the activities I done before - and prevent lymphedema    Currently in Pain? No/denies                [facility] OT Assessment - 06/03/22 0001       Assessment   Medical Diagnosis Bil mastectomy    Referring Provider (OT) DR Maia Plan    Onset Date/Surgical Date 05/09/22    Hand Dominance Right    Next MD Visit next week      Home  Environment   Lives With Spouse      Prior Function   Vocation Retired      AROM   Right Shoulder Flexion 130 Degrees    Right Shoulder ABduction 110 Degrees    Left Shoulder Flexion 120 Degrees    Left Shoulder ABduction 115 Degrees              LYMPHEDEMA/ONCOLOGY QUESTIONNAIRE - 06/03/22 0001       Right Upper Extremity Lymphedema   15 cm Proximal to Olecranon Process 30 cm    10 cm Proximal to Olecranon Process 27.8 cm    Olecranon Process 24.5 cm    15 cm Proximal to Ulnar Styloid Process 22.5 cm    10 cm Proximal to Ulnar Styloid Process 20.2 cm    Just Proximal to Ulnar Styloid Process 16 cm    Across Hand at [facility] 18 cm      Left Upper Extremity Lymphedema   15 cm Proximal to Olecranon Process 29 cm    10 cm Proximal to Olecranon Process 27 cm    Olecranon Process 24 cm    15 cm Proximal to Ulnar Styloid Process 22 cm    10 cm Proximal to Ulnar Styloid Process 19 cm    Just Proximal to Ulnar Styloid Process 16 cm    Across Hand at [facility] 17 cm                Provided pt with pulleys to use 2 x day 20 reps for shoulder ABD and flexion  Keep pain under 2/10  Supine using wand for shoulder flexion and Abd - AAROM 12 reps pain free And gentle ext rotation              OT Education - 06/03/22 2050     Education Details Findings of eval and HEP    Person(s) Educated Patient    Methods Explanation;Demonstration;Tactile cues;Verbal cues    Comprehension Verbal cues required;Returned demonstration;Verbalized understanding                 OT Long Term Goals - 06/03/22 2138       OT LONG TERM GOAL #1   Title Pt to be ind in HEP for AAROM and PROM to increase bilateral shoulder  AROM WNL to pull garments over head    Baseline SHoulder flexion 120-130 and ABD 110 -115 - pull in pect and axilla  Time 6    Period Weeks    Status New    Target Date 07/15/22      OT LONG TERM GOAL #2   Title Pt to be ind in HEP to decrease scar tissue to increase AROM for bilateral shoulder WNL to return to [place] symptoms free    Baseline Pt 4 wks s/p - sterri strips still in place- bilateral shoulder AROM decrease - not very active except basic ADL's    Time 8    Period Weeks    Status New    Target Date 07/29/22      OT LONG TERM GOAL #3   Title Bilateral UE strength return to prior level for pt to return to exercise activities and playing with grandkids symptoms free    Baseline circumference in bilateral UE WNL- no sign and symptoms of lymphedema-small seroma L axilla  - basic ADL's doing - no exercise classes - tight and pull in axilla and pect    Time 8    Status New    Target Date 07/29/22                   Plan - 06/03/22 2051     Clinical Impression Statement Pt present at OT eval with diagnosis of bilateral mastectomy on 05/09/22 - pt present with decrease AROM in bilateral shoulder in all planes with pull and tightness in axilla and pect - Pt with with some edema in L axilla and thoracic. BIlateral UE circumference WNL compare to prior to surgery - pt limited in use of bilateral UE in ADL's and IADL's over head- pt can benefit from skilled OT services to increase motion and strength in bilateral UE and monitor signs and symptoms of lymphedema .    OT Occupational Profile and History Problem Focused Assessment - Including review of records relating to presenting problem    Occupational performance deficits (Please refer to evaluation for details): ADL's;IADL's;Play;Social Participation;Leisure    Body Structure / Function / Physical Skills ADL;Flexibility;ROM;UE functional use;Scar mobility;Strength;Pain;IADL;Edema    Rehab Potential Good    Clinical Decision  Making Limited treatment options, no task modification necessary    Comorbidities Affecting Occupational Performance: None    Modification or Assistance to Complete Evaluation  No modification of tasks or assist necessary to complete eval    OT Frequency 1x / week    OT Duration 8 weeks    OT Treatment/Interventions Self-care/ADL training;Therapeutic exercise;Patient/family education;Scar mobilization;Manual lymph drainage;Compression bandaging;Manual Therapy;Passive range of motion    Consulted and Agree with Plan of Care Patient             Patient will benefit from skilled therapeutic intervention in order to improve the following deficits and impairments:   Body Structure / Function / Physical Skills: ADL, Flexibility, ROM, UE functional use, Scar mobility, Strength, Pain, IADL, Edema       Visit Diagnosis: History of bilateral mastectomy    Problem List Patient Active Problem List   Diagnosis Date Noted   BRCA gene mutation positive 05/09/2022   Family history of pancreatic cancer 01/27/2022   Indeterminate colitis    Goals of care, counseling/discussion 08/19/2019   History of fatty infiltration of liver 09/18/2018   Parkinsonism 09/18/2018   Tremor 08/10/2017   Hypercalcemia 02/15/2017   Intermittent vertigo 11/05/2016   Nonalcoholic steatohepatitis (NASH) 11/01/2016   Osteopenia 02/14/2016   Family history of breast cancer    Family history of colon cancer    Family history  of prostate cancer    History of breast cancer 09/30/2015   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 09/30/2015   Crohn's disease (HCC) 09/30/2015   Mixed hyperlipidemia 09/30/2015   Cancer of right breast with BRCA2 gene mutation (HCC) 09/09/2015   Benign essential hypertension 08/27/2014   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,  [place], [address] Phone: [phone]   Fax:  [phone]  Name: Kelly Munoz MRN: [id number] Date of Birth: 22-Jul-1960

## 2022-06-08 ENCOUNTER — Inpatient Hospital Stay: Payer: BLUE CROSS/BLUE SHIELD | Attending: Internal Medicine | Admitting: Occupational Therapy

## 2022-06-08 DIAGNOSIS — Z9013 Acquired absence of bilateral breasts and nipples: Secondary | ICD-10-CM | POA: Diagnosis present

## 2022-06-08 NOTE — Therapy (Signed)
[facility] [address] [place], [place], [address] Phone: [phone]   Fax:  [phone]  Occupational Therapy Treatment  Patient Details  Name: Kelly Munoz MRN: [id number] Date of Birth: [birth year] Referring Provider (OT): DR Maia Plan   Encounter Date: 06/08/2022   OT End of Session - 06/08/22 1904     Visit Number 2    Number of Visits 6    Date for OT Re-Evaluation 07/29/22    OT Start Time 0830    OT Stop Time 0901    OT Time Calculation (min) 31 min    Activity Tolerance Patient tolerated treatment well    Behavior During Therapy [facility] for tasks assessed/performed             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    Breast cancer (HCC)    Breast cancer, right (HCC) 06/08/2011   Crohn's disease (HCC)    Family history of breast cancer    Family history of colon cancer    Family history of prostate cancer    Heart murmur    History of IBS    History of kidney stones    Hypertension    Lymphocytosis 08/19/2019   Mixed hyperlipidemia    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(HCC)    Nonalcoholic steatohepatitis (NASH)    Parkinson disease 2020   Parkinsonism    Personal history of chemotherapy 2013   RIGHT lumpectomy   Personal history of radiation therapy 2013   RIGHT lumpectomy   PONV (postoperative nausea and vomiting)    nausea   Raynaud's disease    Skin cancer     Past Surgical History:  Procedure Laterality Date   ABDOMINAL HYSTERECTOMY Bilateral    oophorectomy   BREAST BIOPSY Right 2013   lumpectomy chem and rad   BREAST LUMPECTOMY Right 2013   COLONOSCOPY WITH PROPOFOL N/A 02/06/2017   Procedure: COLONOSCOPY WITH PROPOFOL;  Surgeon: Christena Deem, MD;  Location: [facility] ENDOSCOPY;  Service: Endoscopy;  Laterality: N/A;   COLONOSCOPY WITH PROPOFOL N/A 08/02/2021   Procedure:  COLONOSCOPY WITH PROPOFOL;  Surgeon: Toney Reil, MD;  Location: [facility] ENDOSCOPY;  Service: Gastroenterology;  Laterality: N/A;   CYSTOSCOPY N/A 11/09/2015   Procedure: CYSTOSCOPY;  Surgeon: Elenora Fender Ward, MD;  Location: [facility] ORS;  Service: Gynecology;  Laterality: N/A;   parotid stone      Removal   parotid stones Left    PRIMARY CLOSURE Bilateral 05/09/2022   Procedure: ADVANCED CLOSURE;  Surgeon: Peggye Form, DO;  Location: [facility] ORS;  Service: Plastics;  Laterality: Bilateral;   REDUCTION MAMMAPLASTY Bilateral 02/2013   TONSILLECTOMY     TOTAL MASTECTOMY Bilateral 05/09/2022   Procedure: TOTAL MASTECTOMY;  Surgeon: Carolan Shiver, MD;  Location: [facility] ORS;  Service: General;  Laterality: Bilateral;    There were no vitals filed for this visit.   Subjective Assessment - 06/08/22 1858     Subjective  I done the exercises like you showed me.  I can definitely tell a difference.  My grandchildren is coming in 2- 3 weeks and would like to pick up and start back with boxing class    Pertinent History DR CIntron note from 05/26/22 - CHEST: Right breast without any significant fluid. No swelling. Great range of motion of the right upper extremity. Left chest with small seroma in the  medial aspect. Also small amount of fluid on the lateral aspect. No erythema. Good range of motion but still limited.   IMPRESSION:  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 (Z15.01, Z15.09) -S/p bilateral mastectomies with flat aesthetic closure done 05/09/22 -Small seroma of the left chest, currently asymptomatic. Will continue with observation. -Patient with significant improvement of range of motion and getting more comfortable -Will start outpatient referral for exertional therapy  PLAN: 1. Continue the great care you are doing 2. Outpatient referral to Occupational Therapy 3. Can increase activity slowly as tolerated 4. I will see you in 2 weeks for wound follow-up but contact us if you have any concern    Patient  Stated Goals Want to get my motion in my shoulder better that I can return to do all the activities I done before - and prevent lymphedema    Currently in Pain? No/denies                 LYMPHEDEMA/ONCOLOGY QUESTIONNAIRE - 06/08/22 0001       Right Upper Extremity Lymphedema   15 cm Proximal to Olecranon Process 30 cm    10 cm Proximal to Olecranon Process 28 cm    Olecranon Process 24.5 cm    15 cm Proximal to Ulnar Styloid Process 22.5 cm    10 cm Proximal to Ulnar Styloid Process 20 cm    Just Proximal to Ulnar Styloid Process 16 cm    Across Hand at [facility] 18 cm      Left Upper Extremity Lymphedema   15 cm Proximal to Olecranon Process 29.4 cm    10 cm Proximal to Olecranon Process 26.5 cm    Olecranon Process 24 cm    15 cm Proximal to Ulnar Styloid Process 22.5 cm    10 cm Proximal to Ulnar Styloid Process 19 cm    Just Proximal to Ulnar Styloid Process 16 cm             R shoulder ABD 135, flexion 140 L shoulder ABD125, flexion 145  Reassess progress  in bilateral shoulder AROM -and circumference same as prior and after surgery No signs or symptoms of lymphedema Provided pt with pulleys last time - pt to use 2 x day 20 reps for shoulder ABD and flexion  Keep pain under 2/10  Supine using wand for shoulder flexion and Abd - AAROM 12 reps pain free And gentle ext rotation in supine Add this date corner gentle stretch for ext rotation 10 reps hold 5 sec                 OT Education - 06/08/22 1903     Education Details progress and ROM    Person(s) Educated Patient    Methods Explanation;Demonstration;Tactile cues;Verbal cues    Comprehension Verbal cues required;Returned demonstration;Verbalized understanding                 OT Long Term Goals - 06/03/22 2138       OT LONG TERM GOAL #1   Title Pt to be ind in HEP for AAROM and PROM to increase bilateral shoulder AROM WNL to pull garments over head    Baseline SHoulder  flexion 120-130 and ABD 110 -115 - pull in pect and axilla    Time 6    Period Weeks    Status New    Target Date 07/15/22      OT LONG TERM GOAL #2   Title Pt to be ind in HEP  to decrease scar tissue to increase AROM for bilateral shoulder WNL to return to [place] symptoms free    Baseline Pt 4 wks s/p - sterri strips still in place- bilateral shoulder AROM decrease - not very active except basic ADL's    Time 8    Period Weeks    Status New    Target Date 07/29/22      OT LONG TERM GOAL #3   Title Bilateral UE strength return to prior level for pt to return to exercise activities and playing with grandkids symptoms free    Baseline circumference in bilateral UE WNL- no sign and symptoms of lymphedema-small seroma L axilla  - basic ADL's doing - no exercise classes - tight and pull in axilla and pect    Time 8    Status New    Target Date 07/29/22                   Plan - 06/08/22 1905     Clinical Impression Statement Pt present at OT eval with diagnosis of bilateral mastectomy on 05/09/22 - Pt follow up doing HEP for about week - great progress in bilateral shoulder flexion, ABD and ext rotation- pt to cont with same HEP but add gentle PROM for ext rotation painfree. Pt with with some edema in L axilla and thoracic but improving. BIlateral UE circumference cont to be WNL compare to prior to surgery - pt limited in use of bilateral UE in ADL's and IADL's over head activities- pt can benefit from skilled OT services to increase motion and strength in bilateral UE and monitor signs and symptoms of lymphedema .    OT Occupational Profile and History Problem Focused Assessment - Including review of records relating to presenting problem    Occupational performance deficits (Please refer to evaluation for details): ADL's;IADL's;Play;Social Participation;Leisure    Body Structure / Function / Physical Skills ADL;Flexibility;ROM;UE functional use;Scar mobility;Strength;Pain;IADL;Edema     Rehab Potential Good    Clinical Decision Making Limited treatment options, no task modification necessary    Comorbidities Affecting Occupational Performance: None    Modification or Assistance to Complete Evaluation  No modification of tasks or assist necessary to complete eval    OT Frequency 1x / week    OT Duration 8 weeks    OT Treatment/Interventions Self-care/ADL training;Therapeutic exercise;Patient/family education;Scar mobilization;Manual lymph drainage;Compression bandaging;Manual Therapy;Passive range of motion    Consulted and Agree with Plan of Care Patient             Patient will benefit from skilled therapeutic intervention in order to improve the following deficits and impairments:   Body Structure / Function / Physical Skills: ADL, Flexibility, ROM, UE functional use, Scar mobility, Strength, Pain, IADL, Edema       Visit Diagnosis: History of bilateral mastectomy    Problem List Patient Active Problem List   Diagnosis Date Noted   BRCA gene mutation positive 05/09/2022   Family history of pancreatic cancer 01/27/2022   Indeterminate colitis    Goals of care, counseling/discussion 08/19/2019   History of fatty infiltration of liver 09/18/2018   Parkinsonism 09/18/2018   Tremor 08/10/2017   Hypercalcemia 02/15/2017   Intermittent vertigo 11/05/2016   Nonalcoholic steatohepatitis (NASH) 11/01/2016   Osteopenia 02/14/2016   Family history of breast cancer    Family history of colon cancer    Family history of prostate cancer    History of breast cancer 09/30/2015   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 09/30/2015  Crohn's disease (HCC) 09/30/2015   Mixed hyperlipidemia 09/30/2015   Cancer of right breast with BRCA2 gene mutation (HCC) 09/09/2015   Benign essential hypertension 08/27/2014   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, [place], [address] Phone: [phone]   Fax:  [phone]  Name: Kelly Munoz MRN: [id number] Date of Birth: [birth year]

## 2022-06-15 ENCOUNTER — Inpatient Hospital Stay: Payer: BLUE CROSS/BLUE SHIELD | Attending: Internal Medicine | Admitting: Occupational Therapy

## 2022-06-15 DIAGNOSIS — Z9013 Acquired absence of bilateral breasts and nipples: Secondary | ICD-10-CM | POA: Insufficient documentation

## 2022-06-15 NOTE — Therapy (Signed)
[facility] [address] [place], [place], [address] Phone: [phone]   Fax:  [phone]  Occupational Therapy Treatment  Patient Details  Name: Kelly Munoz MRN: [id number] Date of Birth: 01-Jan-1961 Referring Provider (OT): DR Maia Plan   Encounter Date: 06/15/2022   OT End of Session - 06/15/22 1845     Visit Number 3    Number of Visits 6    Date for OT Re-Evaluation 07/29/22    OT Start Time 1130    OT Stop Time 1203    OT Time Calculation (min) 33 min    Activity Tolerance Patient tolerated treatment well    Behavior During Therapy [facility] for tasks assessed/performed             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    Breast cancer (HCC)    Breast cancer, right (HCC) 06/08/2011   Crohn's disease (HCC)    Family history of breast cancer    Family history of colon cancer    Family history of prostate cancer    Heart murmur    History of IBS    History of kidney stones    Hypertension    Lymphocytosis 08/19/2019   Mixed hyperlipidemia    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(HCC)    Nonalcoholic steatohepatitis (NASH)    Parkinson disease 2020   Parkinsonism    Personal history of chemotherapy 2013   RIGHT lumpectomy   Personal history of radiation therapy 2013   RIGHT lumpectomy   PONV (postoperative nausea and vomiting)    nausea   Raynaud's disease    Skin cancer     Past Surgical History:  Procedure Laterality Date   ABDOMINAL HYSTERECTOMY Bilateral    oophorectomy   BREAST BIOPSY Right 2013   lumpectomy chem and rad   BREAST LUMPECTOMY Right 2013   COLONOSCOPY WITH PROPOFOL N/A 02/06/2017   Procedure: COLONOSCOPY WITH PROPOFOL;  Surgeon: Christena Deem, MD;  Location: [facility] ENDOSCOPY;  Service: Endoscopy;  Laterality: N/A;   COLONOSCOPY WITH PROPOFOL N/A 08/02/2021   Procedure:  COLONOSCOPY WITH PROPOFOL;  Surgeon: Toney Reil, MD;  Location: [facility] ENDOSCOPY;  Service: Gastroenterology;  Laterality: N/A;   CYSTOSCOPY N/A 11/09/2015   Procedure: CYSTOSCOPY;  Surgeon: Elenora Fender Ward, MD;  Location: [facility] ORS;  Service: Gynecology;  Laterality: N/A;   parotid stone      Removal   parotid stones Left    PRIMARY CLOSURE Bilateral 05/09/2022   Procedure: ADVANCED CLOSURE;  Surgeon: Peggye Form, DO;  Location: [facility] ORS;  Service: Plastics;  Laterality: Bilateral;   REDUCTION MAMMAPLASTY Bilateral 02/2013   TONSILLECTOMY     TOTAL MASTECTOMY Bilateral 05/09/2022   Procedure: TOTAL MASTECTOMY;  Surgeon: Carolan Shiver, MD;  Location: [facility] ORS;  Service: General;  Laterality: Bilateral;    There were no vitals filed for this visit.   Subjective Assessment - 06/15/22 1843     Subjective  Doing better- no pain - just tightness- tried the corner stretch but did not feel it really - when can I start back the boxing classes for Parkinsons    Pertinent History DR CIntron note from 05/26/22 - CHEST: Right breast without any significant fluid. No swelling. Great range of motion of the right upper extremity. Left chest with small seroma in the medial aspect. Also small amount of fluid on  the lateral aspect. No erythema. Good range of motion but still limited.   IMPRESSION:  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 (Z15.01, Z15.09) -S/p bilateral mastectomies with flat aesthetic closure done 05/09/22 -Small seroma of the left chest, currently asymptomatic. Will continue with observation. -Patient with significant improvement of range of motion and getting more comfortable -Will start outpatient referral for exertional therapy  PLAN: 1. Continue the great care you are doing 2. Outpatient referral to Occupational Therapy 3. Can increase activity slowly as tolerated 4. I will see you in 2 weeks for wound follow-up but contact us if you have any concern    Patient Stated Goals Want to get my  motion in my shoulder better that I can return to do all the activities I done before - and prevent lymphedema    Currently in Pain? No/denies                 LYMPHEDEMA/ONCOLOGY QUESTIONNAIRE - 06/15/22 0001       Right Upper Extremity Lymphedema   15 cm Proximal to Olecranon Process 30 cm    10 cm Proximal to Olecranon Process 28.5 cm    Olecranon Process 25 cm      Left Upper Extremity Lymphedema   15 cm Proximal to Olecranon Process 29 cm    10 cm Proximal to Olecranon Process 27 cm    Olecranon Process 24 cm               R shoulder ABD 140, flexion 145 L shoulder ABD135, flexion 150   Reassess progress  in bilateral shoulder AROM -and circumference same as prior than after surgery No signs or symptoms of lymphedema Provided pt with pulleys  at eval -use 2 x day 20 reps for shoulder ABD and flexion  Keep pain under 2/10  Supine using wand for shoulder flexion and Abd - AAROM 12 reps pain free And gentle ext rotation in supine Reviewed again gentle stretch for ext rotation in corner 10 reps hold 5 sec As well as add child pose Initiate strengthening for retraction and shoulder extention 12 reps - RTB  2 sets  1 x day                 OT Education - 06/15/22 1844     Education Details progress and ROM    Person(s) Educated Patient    Methods Explanation;Demonstration;Tactile cues;Verbal cues    Comprehension Verbal cues required;Returned demonstration;Verbalized understanding                 OT Long Term Goals - 06/03/22 2138       OT LONG TERM GOAL #1   Title Pt to be ind in HEP for AAROM and PROM to increase bilateral shoulder AROM WNL to pull garments over head    Baseline SHoulder flexion 120-130 and ABD 110 -115 - pull in pect and axilla    Time 6    Period Weeks    Status New    Target Date 07/15/22      OT LONG TERM GOAL #2   Title Pt to be ind in HEP to decrease scar tissue to increase AROM for bilateral shoulder WNL  to return to Tai [place] symptoms free    Baseline Pt 4 wks s/p - sterri strips still in place- bilateral shoulder AROM decrease - not very active except basic ADL's    Time 8    Period Weeks    Status New    Target Date 07/29/22  OT LONG TERM GOAL #3   Title Bilateral UE strength return to prior level for pt to return to exercise activities and playing with grandkids symptoms free    Baseline circumference in bilateral UE WNL- no sign and symptoms of lymphedema-small seroma L axilla  - basic ADL's doing - no exercise classes - tight and pull in axilla and pect    Time 8    Status New    Target Date 07/29/22                   Plan - 06/15/22 1845     Clinical Impression Statement Pt present at OT eval with diagnosis of bilateral mastectomy on 05/09/22 - Pt follow up doing HEP for about  2 wks  Pt is 5 1/2 wks s/p- cont to make great progress in bilateral shoulder flexion, ABD and ext rotation- pt to cont with same HEP but reviewed again ext rotation stretch painfree. As well as add this date childs pose. Also initiate strengthening for scapula retraction and shoulder ext 2 x12 reps with RTB. Can start back boxing classes for Parkinsons but no contact punching.  Pt with with some edema in L axilla and thoracic but improving. BIlateral UE circumference cont to be WNL compare to prior to surgery - pt limited in use of bilateral UE in ADL's and IADL's over head activities- pt can benefit from skilled OT services to increase motion and strength in bilateral UE and monitor signs and symptoms of lymphedema .    OT Occupational Profile and History Problem Focused Assessment - Including review of records relating to presenting problem    Occupational performance deficits (Please refer to evaluation for details): ADL's;IADL's;Play;Social Participation;Leisure    Body Structure / Function / Physical Skills ADL;Flexibility;ROM;UE functional use;Scar mobility;Strength;Pain;IADL;Edema    Rehab  Potential Good    Clinical Decision Making Limited treatment options, no task modification necessary    Comorbidities Affecting Occupational Performance: None    Modification or Assistance to Complete Evaluation  No modification of tasks or assist necessary to complete eval    OT Frequency 1x / week    OT Duration 8 weeks    OT Treatment/Interventions Self-care/ADL training;Therapeutic exercise;Patient/family education;Scar mobilization;Manual lymph drainage;Compression bandaging;Manual Therapy;Passive range of motion    Consulted and Agree with Plan of Care Patient             Patient will benefit from skilled therapeutic intervention in order to improve the following deficits and impairments:   Body Structure / Function / Physical Skills: ADL, Flexibility, ROM, UE functional use, Scar mobility, Strength, Pain, IADL, Edema       Visit Diagnosis: History of bilateral mastectomy    Problem List Patient Active Problem List   Diagnosis Date Noted   BRCA gene mutation positive 05/09/2022   Family history of pancreatic cancer 01/27/2022   Indeterminate colitis    Goals of care, counseling/discussion 08/19/2019   History of fatty infiltration of liver 09/18/2018   Parkinsonism 09/18/2018   Tremor 08/10/2017   Hypercalcemia 02/15/2017   Intermittent vertigo 11/05/2016   Nonalcoholic steatohepatitis (NASH) 11/01/2016   Osteopenia 02/14/2016   Family history of breast cancer    Family history of colon cancer    Family history of prostate cancer    History of breast cancer 09/30/2015   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 09/30/2015   Crohn's disease (HCC) 09/30/2015   Mixed hyperlipidemia 09/30/2015   Cancer of right breast with BRCA2 gene mutation (HCC) 09/09/2015   Benign  essential hypertension 08/27/2014   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, [place], [address] Phone: [phone]   Fax:  [phone]  Name: Kelly Munoz MRN: [id number] Date of Birth: 04-09-1960

## 2022-06-29 ENCOUNTER — Ambulatory Visit: Payer: BLUE CROSS/BLUE SHIELD | Attending: General Surgery | Admitting: Occupational Therapy

## 2022-06-29 DIAGNOSIS — Z9013 Acquired absence of bilateral breasts and nipples: Secondary | ICD-10-CM | POA: Insufficient documentation

## 2022-06-29 DIAGNOSIS — M25612 Stiffness of left shoulder, not elsewhere classified: Secondary | ICD-10-CM | POA: Insufficient documentation

## 2022-06-29 DIAGNOSIS — M25611 Stiffness of right shoulder, not elsewhere classified: Secondary | ICD-10-CM | POA: Diagnosis present

## 2022-06-29 NOTE — Therapy (Signed)
[facility] [address] [place], [place], [address] Phone: [phone]   Fax:  [phone]  Occupational Therapy Treatment  Patient Details  Name: Kelly Munoz MRN: [id number] Date of Birth: Dec 14, 1960 Referring Provider (OT): DR Maia Plan   Encounter Date: 06/29/2022   OT End of Session - 06/29/22 1701     Visit Number 4    Number of Visits 6    Date for OT Re-Evaluation 07/29/22    OT Start Time 1035    OT Stop Time 1100    OT Time Calculation (min) 25 min    Activity Tolerance Patient tolerated treatment well    Behavior During Therapy [facility] for tasks assessed/performed             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    Breast cancer (HCC)    Breast cancer, right (HCC) 06/08/2011   Crohn's disease (HCC)    Family history of breast cancer    Family history of colon cancer    Family history of prostate cancer    Heart murmur    History of IBS    History of kidney stones    Hypertension    Lymphocytosis 08/19/2019   Mixed hyperlipidemia    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(HCC)    Nonalcoholic steatohepatitis (NASH)    Parkinson disease 2020   Parkinsonism    Personal history of chemotherapy 2013   RIGHT lumpectomy   Personal history of radiation therapy 2013   RIGHT lumpectomy   PONV (postoperative nausea and vomiting)    nausea   Raynaud's disease    Skin cancer     Past Surgical History:  Procedure Laterality Date   ABDOMINAL HYSTERECTOMY Bilateral    oophorectomy   BREAST BIOPSY Right 2013   lumpectomy chem and rad   BREAST LUMPECTOMY Right 2013   COLONOSCOPY WITH PROPOFOL N/A 02/06/2017   Procedure: COLONOSCOPY WITH PROPOFOL;  Surgeon: Christena Deem, MD;  Location: [facility] ENDOSCOPY;  Service: Endoscopy;  Laterality: N/A;   COLONOSCOPY WITH PROPOFOL N/A 08/02/2021   Procedure:  COLONOSCOPY WITH PROPOFOL;  Surgeon: Toney Reil, MD;  Location: [facility] ENDOSCOPY;  Service: Gastroenterology;  Laterality: N/A;   CYSTOSCOPY N/A 11/09/2015   Procedure: CYSTOSCOPY;  Surgeon: Elenora Fender Ward, MD;  Location: [facility] ORS;  Service: Gynecology;  Laterality: N/A;   parotid stone      Removal   parotid stones Left    PRIMARY CLOSURE Bilateral 05/09/2022   Procedure: ADVANCED CLOSURE;  Surgeon: Peggye Form, DO;  Location: [facility] ORS;  Service: Plastics;  Laterality: Bilateral;   REDUCTION MAMMAPLASTY Bilateral 02/2013   TONSILLECTOMY     TOTAL MASTECTOMY Bilateral 05/09/2022   Procedure: TOTAL MASTECTOMY;  Surgeon: Carolan Shiver, MD;  Location: [facility] ORS;  Service: General;  Laterality: Bilateral;    There were no vitals filed for this visit.   Subjective Assessment - 06/29/22 1658     Subjective  I don't have pain - just tight -and the L worse than the R - we thought the R will be worse because of having in past lumpectomy and radiation. In the am stiff.    Pertinent History DR CIntron note from 05/26/22 - CHEST: Right breast without any significant fluid. No swelling. Great range of motion of the right upper extremity. Left chest with small seroma in the medial aspect.  Also small amount of fluid on the lateral aspect. No erythema. Good range of motion but still limited.   IMPRESSION:  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 (Z15.01, Z15.09) -S/p bilateral mastectomies with flat aesthetic closure done 05/09/22 -Small seroma of the left chest, currently asymptomatic. Will continue with observation. -Patient with significant improvement of range of motion and getting more comfortable -Will start outpatient referral for exertional therapy  PLAN: 1. Continue the great care you are doing 2. Outpatient referral to Occupational Therapy 3. Can increase activity slowly as tolerated 4. I will see you in 2 weeks for wound follow-up but contact us if you have any concern    Patient Stated Goals Want to  get my motion in my shoulder better that I can return to do all the activities I done before - and prevent lymphedema    Currently in Pain? No/denies                [facility] OT Assessment - 06/29/22 0001       AROM   Right Shoulder Flexion 160 Degrees    Right Shoulder ABduction 145 Degrees    Left Shoulder Flexion 150 Degrees    Left Shoulder ABduction 135 Degrees             LYMPHEDEMA/ONCOLOGY QUESTIONNAIRE - 06/29/22 0001       Right Upper Extremity Lymphedema   15 cm Proximal to Olecranon Process 30.4 cm    10 cm Proximal to Olecranon Process 27.5 cm    Olecranon Process 24.8 cm      Left Upper Extremity Lymphedema   15 cm Proximal to Olecranon Process 29.5 cm    10 cm Proximal to Olecranon Process 27.5 cm    Olecranon Process 24.5 cm                Reassess progress  in bilateral shoulder AROM patient continues to make great progress with left shoulder abduction and flexion endrange the tightest. Normal to feel stiff in the morning. Circumference within normal limits compared to each other.  Continue to monitor.  No signs or symptoms of lymphedema Patient to continue with pulleys provided at eval -use 2 x day 20 reps for shoulder ABD and flexion  Keep pain under 2/10  Supine using wand for shoulder flexion and Abd - AAROM 12 reps pain free And ext rotation in supine Continue with  child pose-can add external rotation for the right arm.  Hold 5 seconds 10 reps At this date also for patient lateral trunk and thoracic stretch in standing against wall or in supine. Stretch for latissimus dorsi 10 reps hold 5 seconds. Continue strengthening for retraction and shoulder extention 12 reps - RTB  2 sets  1 x day  She can initiate some 1 pound weight overhead as well as upper back exercises If no issues in about a week and go to 2 pounds. Still hold off on contact punching with boxing as well as picking up 61-year-old grandbaby. Follow-up with me in 2 to 3  weeks.               OT Education - 06/29/22 1701     Education Details progress and ROM    Person(s) Educated Patient    Methods Explanation;Demonstration;Tactile cues;Verbal cues    Comprehension Verbal cues required;Returned demonstration;Verbalized understanding                 OT Long Term Goals - 06/03/22 2138       OT  LONG TERM GOAL #1   Title Pt to be ind in HEP for AAROM and PROM to increase bilateral shoulder AROM WNL to pull garments over head    Baseline SHoulder flexion 120-130 and ABD 110 -115 - pull in pect and axilla    Time 6    Period Weeks    Status New    Target Date 07/15/22      OT LONG TERM GOAL #2   Title Pt to be ind in HEP to decrease scar tissue to increase AROM for bilateral shoulder WNL to return to [place] symptoms free    Baseline Pt 4 wks s/p - sterri strips still in place- bilateral shoulder AROM decrease - not very active except basic ADL's    Time 8    Period Weeks    Status New    Target Date 07/29/22      OT LONG TERM GOAL #3   Title Bilateral UE strength return to prior level for pt to return to exercise activities and playing with grandkids symptoms free    Baseline circumference in bilateral UE WNL- no sign and symptoms of lymphedema-small seroma L axilla  - basic ADL's doing - no exercise classes - tight and pull in axilla and pect    Time 8    Status New    Target Date 07/29/22                   Plan - 06/29/22 1702     Clinical Impression Statement Pt present at OT eval with diagnosis of bilateral mastectomy on 05/09/22 - Pt follow up doing HEP for about  2 wks  Pt is 7 1/2 wks s/p- cont to make great progress in bilateral shoulder flexion, ABD and ext rotation with L shoulder worse than R. Reviewed with pt HEP - cont to do child pose , but also Lat stretch on wall or in supine. Pt can initiate gradually 1 lbs for week and then 2 lbs for upper back and over head strengthening - and cont with RTB for scapula  retraction and shoulder ext 2 x12 reps . Cont boxing classes for Parkinsons but no contact punching.  Pt with with some edema in L axilla and thoracic but improving. BIlateral UE circumference cont to be WNL compare to prior to surgery  and each other. Pt to still hold off on contact punch in boxing class and picking up 2 yrs old grand daughter. Cont to be limited in use of bilateral UE in ADL's and IADL's over head activities. Pt can benefit from skilled OT services to increase motion and strength in bilateral UE and monitor signs and symptoms of lymphedema .    OT Occupational Profile and History Problem Focused Assessment - Including review of records relating to presenting problem    Occupational performance deficits (Please refer to evaluation for details): ADL's;IADL's;Play;Social Participation;Leisure    Body Structure / Function / Physical Skills ADL;Flexibility;ROM;UE functional use;Scar mobility;Strength;Pain;IADL;Edema    Rehab Potential Good    Clinical Decision Making Limited treatment options, no task modification necessary    Comorbidities Affecting Occupational Performance: None    Modification or Assistance to Complete Evaluation  No modification of tasks or assist necessary to complete eval    OT Frequency Biweekly    OT Duration 4 weeks    OT Treatment/Interventions Self-care/ADL training;Therapeutic exercise;Patient/family education;Scar mobilization;Manual lymph drainage;Compression bandaging;Manual Therapy;Passive range of motion    Consulted and Agree with Plan of Care Patient  Patient will benefit from skilled therapeutic intervention in order to improve the following deficits and impairments:   Body Structure / Function / Physical Skills: ADL, Flexibility, ROM, UE functional use, Scar mobility, Strength, Pain, IADL, Edema       Visit Diagnosis: History of bilateral mastectomy  Stiffness of left shoulder, not elsewhere classified  Stiffness of right  shoulder, not elsewhere classified    Problem List Patient Active Problem List   Diagnosis Date Noted   BRCA gene mutation positive 05/09/2022   Family history of pancreatic cancer 01/27/2022   Indeterminate colitis    Goals of care, counseling/discussion 08/19/2019   History of fatty infiltration of liver 09/18/2018   Parkinsonism 09/18/2018   Tremor 08/10/2017   Hypercalcemia 02/15/2017   Intermittent vertigo 11/05/2016   Nonalcoholic steatohepatitis (NASH) 11/01/2016   Osteopenia 02/14/2016   Family history of breast cancer    Family history of colon cancer    Family history of prostate cancer    History of breast cancer 09/30/2015   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 09/30/2015   Crohn's disease (HCC) 09/30/2015   Mixed hyperlipidemia 09/30/2015   Cancer of right breast with BRCA2 gene mutation (HCC) 09/09/2015   Benign essential hypertension 08/27/2014   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, [place], [address] Phone: [phone]   Fax:  [phone]  Name: ZABELLE LOISEL MRN: [id number] Date of Birth: 19-Jun-1960

## 2022-07-18 ENCOUNTER — Ambulatory Visit: Payer: 59 | Attending: General Surgery | Admitting: Occupational Therapy

## 2022-07-18 ENCOUNTER — Telehealth: Payer: Self-pay | Admitting: Occupational Therapy

## 2022-07-18 DIAGNOSIS — Z9013 Acquired absence of bilateral breasts and nipples: Secondary | ICD-10-CM | POA: Insufficient documentation

## 2022-07-18 DIAGNOSIS — M25611 Stiffness of right shoulder, not elsewhere classified: Secondary | ICD-10-CM | POA: Diagnosis present

## 2022-07-18 DIAGNOSIS — M25612 Stiffness of left shoulder, not elsewhere classified: Secondary | ICD-10-CM | POA: Diagnosis present

## 2022-07-18 NOTE — Therapy (Signed)
[facility] [address] [place], [place], [address] Phone: [phone]   Fax:  [phone]  Occupational Therapy Treatment  Patient Details  Name: Kelly Munoz MRN: [id number] Date of Birth: 12-08-1960 Referring Provider (OT): DR Maia Plan   Encounter Date: 07/18/2022   OT End of Session - 07/18/22 1422     Visit Number 5    Number of Visits 6    Date for OT Re-Evaluation 07/29/22    OT Start Time 1336    OT Stop Time 1418    OT Time Calculation (min) 42 min    Activity Tolerance Patient tolerated treatment well    Behavior During Therapy [facility] for tasks assessed/performed             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    Breast cancer (HCC)    Breast cancer, right (HCC) 06/08/2011   Crohn's disease (HCC)    Family history of breast cancer    Family history of colon cancer    Family history of prostate cancer    Heart murmur    History of IBS    History of kidney stones    Hypertension    Lymphocytosis 08/19/2019   Mixed hyperlipidemia    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(HCC)    Nonalcoholic steatohepatitis (NASH)    Parkinson disease 2020   Parkinsonism    Personal history of chemotherapy 2013   RIGHT lumpectomy   Personal history of radiation therapy 2013   RIGHT lumpectomy   PONV (postoperative nausea and vomiting)    nausea   Raynaud's disease    Skin cancer     Past Surgical History:  Procedure Laterality Date   ABDOMINAL HYSTERECTOMY Bilateral    oophorectomy   BREAST BIOPSY Right 2013   lumpectomy chem and rad   BREAST LUMPECTOMY Right 2013   COLONOSCOPY WITH PROPOFOL N/A 02/06/2017   Procedure: COLONOSCOPY WITH PROPOFOL;  Surgeon: Christena Deem, MD;  Location: [facility] ENDOSCOPY;  Service: Endoscopy;  Laterality: N/A;   COLONOSCOPY WITH PROPOFOL N/A 08/02/2021   Procedure:  COLONOSCOPY WITH PROPOFOL;  Surgeon: Toney Reil, MD;  Location: [facility] ENDOSCOPY;  Service: Gastroenterology;  Laterality: N/A;   CYSTOSCOPY N/A 11/09/2015   Procedure: CYSTOSCOPY;  Surgeon: Elenora Fender Ward, MD;  Location: [facility] ORS;  Service: Gynecology;  Laterality: N/A;   parotid stone      Removal   parotid stones Left    PRIMARY CLOSURE Bilateral 05/09/2022   Procedure: ADVANCED CLOSURE;  Surgeon: Peggye Form, DO;  Location: [facility] ORS;  Service: Plastics;  Laterality: Bilateral;   REDUCTION MAMMAPLASTY Bilateral 02/2013   TONSILLECTOMY     TOTAL MASTECTOMY Bilateral 05/09/2022   Procedure: TOTAL MASTECTOMY;  Surgeon: Carolan Shiver, MD;  Location: [facility] ORS;  Service: General;  Laterality: Bilateral;    There were no vitals filed for this visit.   Subjective Assessment - 07/18/22 1420     Subjective  It still feels tight in the am especilally but then after I do the motion -it feels better- there are area on the L that is extra skin surgeon said- done the bands and 1 lbs    Pertinent History DR CIntron note from 05/26/22 - CHEST: Right breast without any significant fluid. No swelling. Great range of motion of the right upper extremity. Left chest with small seroma in the  medial aspect. Also small amount of fluid on the lateral aspect. No erythema. Good range of motion but still limited.   IMPRESSION:  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 (Z15.01, Z15.09) -S/p bilateral mastectomies with flat aesthetic closure done 05/09/22 -Small seroma of the left chest, currently asymptomatic. Will continue with observation. -Patient with significant improvement of range of motion and getting more comfortable -Will start outpatient referral for exertional therapy  PLAN: 1. Continue the great care you are doing 2. Outpatient referral to Occupational Therapy 3. Can increase activity slowly as tolerated 4. I will see you in 2 weeks for wound follow-up but contact us if you have any concern    Patient Stated  Goals Want to get my motion in my shoulder better that I can return to do all the activities I done before - and prevent lymphedema    Currently in Pain? No/denies                [facility] OT Assessment - 07/18/22 0001       AROM   Right Shoulder Flexion 165 Degrees    Right Shoulder ABduction 160 Degrees    Left Shoulder Flexion 165 Degrees    Left Shoulder ABduction 160 Degrees             LYMPHEDEMA/ONCOLOGY QUESTIONNAIRE - 07/18/22 0001       Right Upper Extremity Lymphedema   15 cm Proximal to Olecranon Process 30 cm    10 cm Proximal to Olecranon Process 28 cm    Olecranon Process 25 cm      Left Upper Extremity Lymphedema   15 cm Proximal to Olecranon Process 29 cm    10 cm Proximal to Olecranon Process 27 cm    Olecranon Process 24 cm             Reassess progress  in bilateral shoulder AROM WFL - close to normal - tight at end range ABD and flexion Stiffness mostly in the am Circumference  WNL  compared to each other.  Continue to monitor.  No signs or symptoms of lymphedema  Patient cont to do  pulleys  20 reps for shoulder ABD and flexion  Followed by child pose  Supine using wand for shoulder flexion and Abd in supine - pt can add hip rotation opposite side for extra stretch  And ext rotation in supine single and then bilateral   Upgrade to strengthening for retraction and shoulder extention 12 reps - GTB  2 sets  1 x day  She done 1 lbs - but do not have 2 lbs Add and review RTB for shoulder D1 and D2 patterns - for shoulder - pain free 12 reps 1 x day Can increase to 2nd and 3rd set the next 3 wks  She can get back to light punching bag during her Parkinson's boxing classes  Follow up in 3 wks                 OT Education - 07/18/22 1421     Education Details progress and changes to HEP    Person(s) Educated Patient    Methods Explanation;Demonstration;Tactile cues;Verbal cues    Comprehension Verbal cues required;Returned  demonstration;Verbalized understanding                 OT Long Term Goals - 06/03/22 2138       OT LONG TERM GOAL #1   Title Pt to be ind in HEP for AAROM and PROM to increase bilateral shoulder  AROM WNL to pull garments over head    Baseline SHoulder flexion 120-130 and ABD 110 -115 - pull in pect and axilla    Time 6    Period Weeks    Status New    Target Date 07/15/22      OT LONG TERM GOAL #2   Title Pt to be ind in HEP to decrease scar tissue to increase AROM for bilateral shoulder WNL to return to Tai chi symptoms free    Baseline Pt 4 wks s/p - sterri strips still in place- bilateral shoulder AROM decrease - not very active except basic ADL's    Time 8    Period Weeks    Status New    Target Date 07/29/22      OT LONG TERM GOAL #3   Title Bilateral UE strength return to prior level for pt to return to exercise activities and playing with grandkids symptoms free    Baseline circumference in bilateral UE WNL- no sign and symptoms of lymphedema-small seroma L axilla  - basic ADL's doing - no exercise classes - tight and pull in axilla and pect    Time 8    Status New    Target Date 07/29/22                   Plan - 07/18/22 1422     Clinical Impression Statement Pt present at OT eval with diagnosis of bilateral mastectomy on 05/09/22.   Pt is 10 wks s/p- cont to make great progress in bilateral shoulder flexion, ABD  WFL as well as ext rotation - tigthtness in lsat 10 degrees and bilateral ext rotation. Pt to cont with Pulley's and  child pose , but can also do in supine -shoulder flexion with hip rotation opposite side. Has beend doing 1 lbs weight and RTB for scapula retraction and shoulder ext 2-3 x12 reps .  HEP upgraded to GTB for scapula and shoulder HEP done RTB - pt can start back doing light contact in boxing classes for Parkinsons. BIlateral UE circumference cont to be WNL compare to prior to surgery  and each other.  Pt cont with increase sets and  activity at home- cont to be limited in use of bilateral UE in ADL's and IADL's over head activities. Pt can benefit from skilled OT services to increase motion and strength in bilateral UE and monitor signs and symptoms of lymphedema .    OT Occupational Profile and History Problem Focused Assessment - Including review of records relating to presenting problem    Occupational performance deficits (Please refer to evaluation for details): ADL's;IADL's;Play;Social Participation;Leisure    Body Structure / Function / Physical Skills ADL;Flexibility;ROM;UE functional use;Scar mobility;Strength;Pain;IADL;Edema    Rehab Potential Good    Clinical Decision Making Limited treatment options, no task modification necessary    Comorbidities Affecting Occupational Performance: None    Modification or Assistance to Complete Evaluation  No modification of tasks or assist necessary to complete eval    OT Frequency --   3 wks   OT Duration 6 weeks    OT Treatment/Interventions Self-care/ADL training;Therapeutic exercise;Patient/family education;Scar mobilization;Manual lymph drainage;Compression bandaging;Manual Therapy;Passive range of motion    Consulted and Agree with Plan of Care Patient             Patient will benefit from skilled therapeutic intervention in order to improve the following deficits and impairments:   Body Structure / Function / Physical Skills: ADL, Flexibility, ROM, UE functional  use, Scar mobility, Strength, Pain, IADL, Edema       Visit Diagnosis: History of bilateral mastectomy  Stiffness of left shoulder, not elsewhere classified  Stiffness of right shoulder, not elsewhere classified    Problem List Patient Active Problem List   Diagnosis Date Noted   BRCA gene mutation positive 05/09/2022   Family history of pancreatic cancer 01/27/2022   Indeterminate colitis    Goals of care, counseling/discussion 08/19/2019   History of fatty infiltration of liver 09/18/2018    Parkinsonism 09/18/2018   Tremor 08/10/2017   Hypercalcemia 02/15/2017   Intermittent vertigo 11/05/2016   Nonalcoholic steatohepatitis (NASH) 11/01/2016   Osteopenia 02/14/2016   Family history of breast cancer    Family history of colon cancer    Family history of prostate cancer    History of breast cancer 09/30/2015   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 09/30/2015   Crohn's disease (HCC) 09/30/2015   Mixed hyperlipidemia 09/30/2015   Cancer of right breast with BRCA2 gene mutation (HCC) 09/09/2015   Benign essential hypertension 08/27/2014   Disequilibrium 08/27/2014   Breast cancer, right (HCC) 06/08/2011    [name], [name],[name] 07/18/2022, 2:28 PM  [place]  [facility] [address] [place], [address] Phone: [phone]   Fax:  [phone]  Name: Kelly Munoz MRN: [id number] Date of Birth: [birth year]

## 2022-08-02 IMAGING — US US BIOPSY CORE LIVER
1 series · 9 of 9 positions shown · non-contrast
Comparison: none

INDICATION: 58-year-old female referred for medical liver biopsy

[Series 1: us biopsy · 9 of 9 slices shown]
[im 1/9]
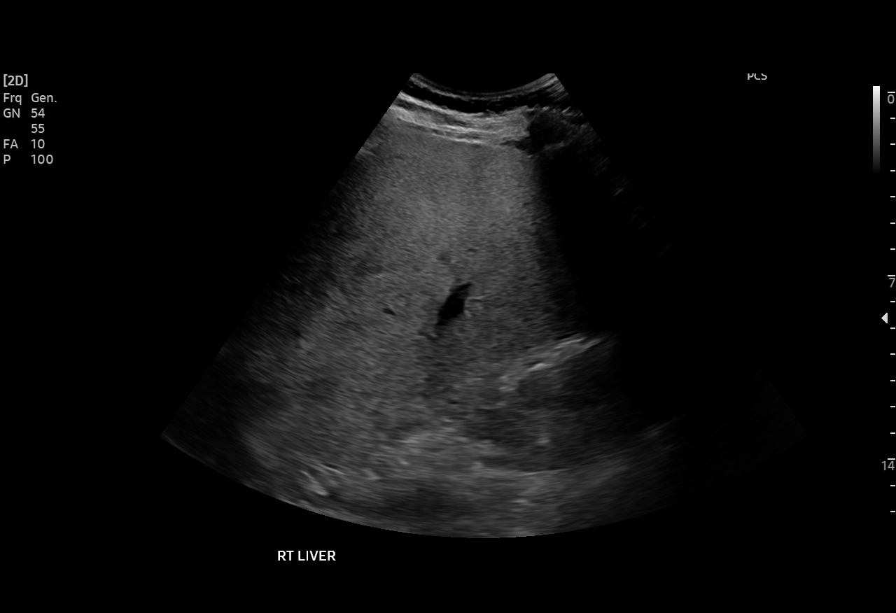
[im 2/9]
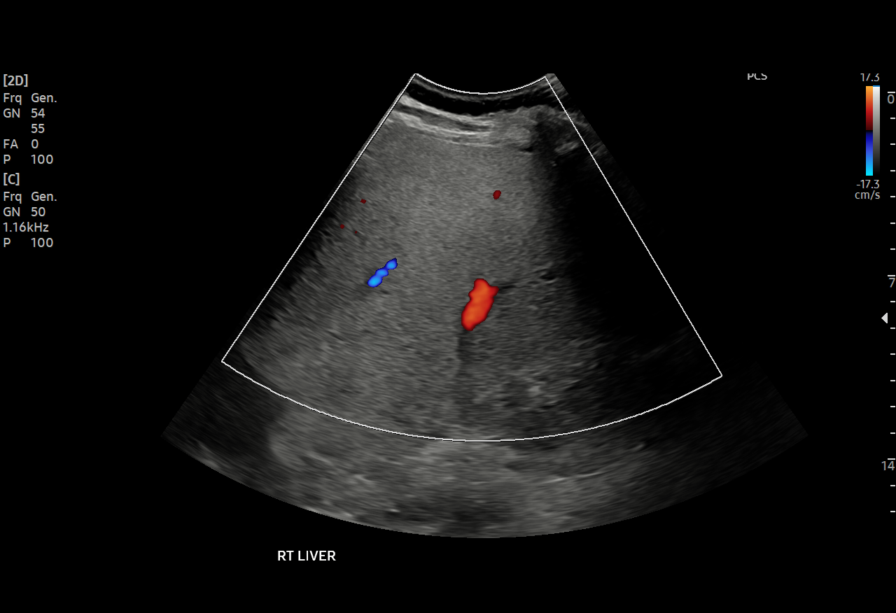
[im 3/9]
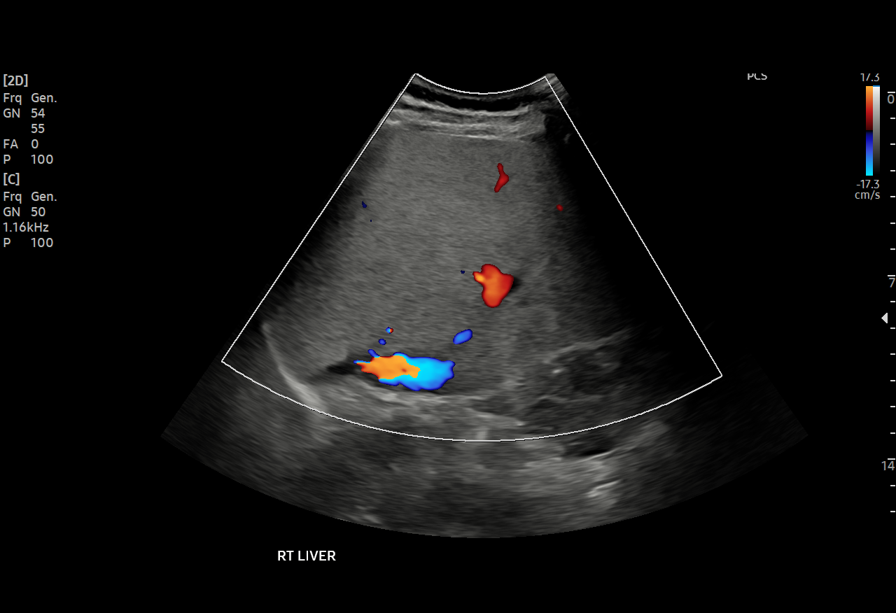
[im 4/9]
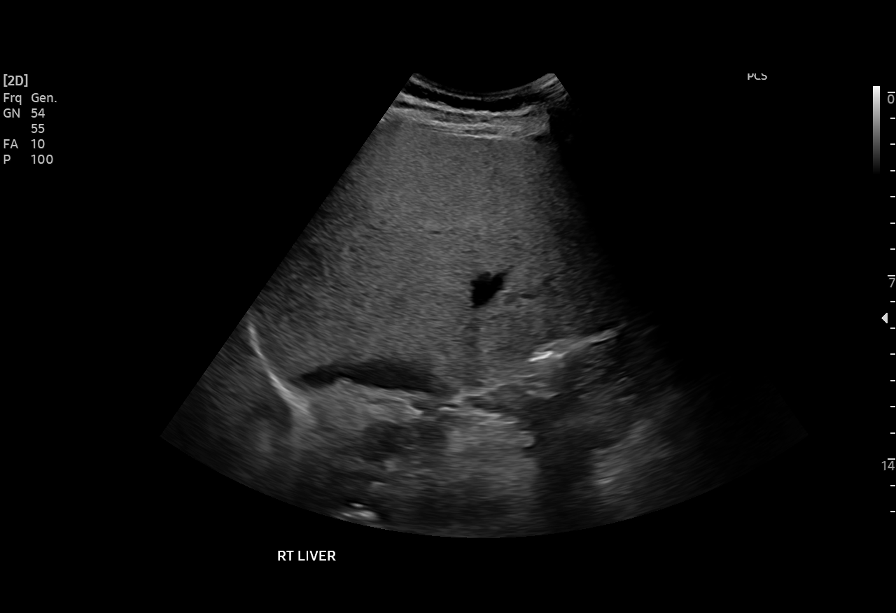
[im 5/9]
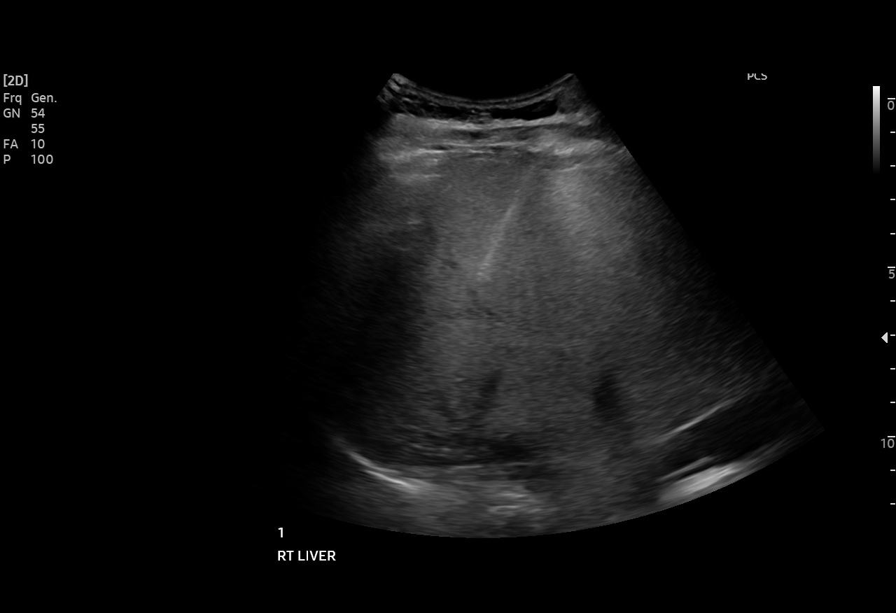
[im 6/9]
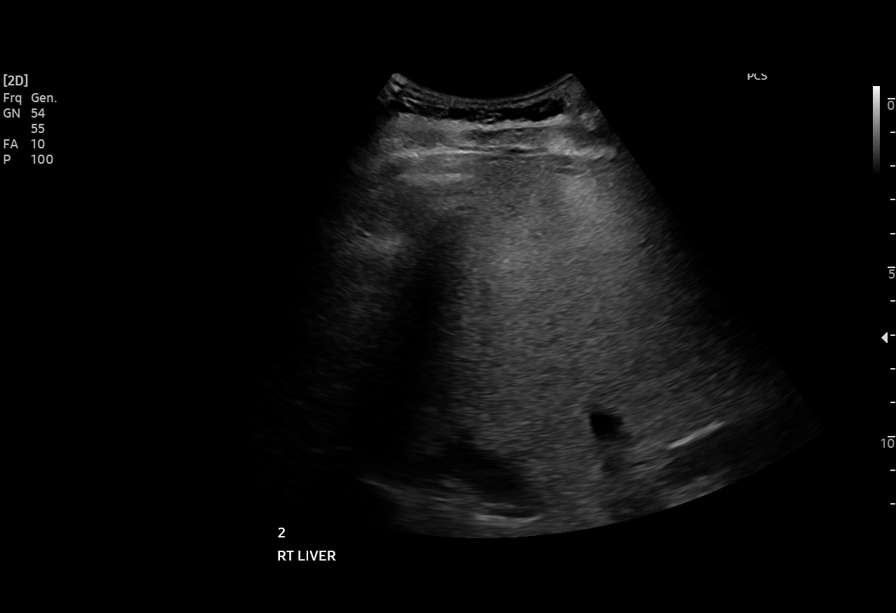
[im 7/9]
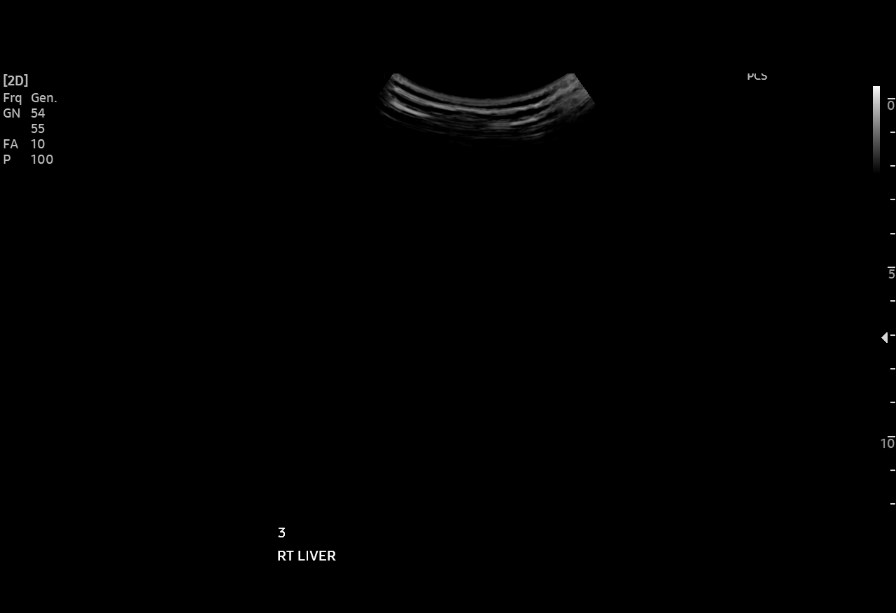
[im 8/9]
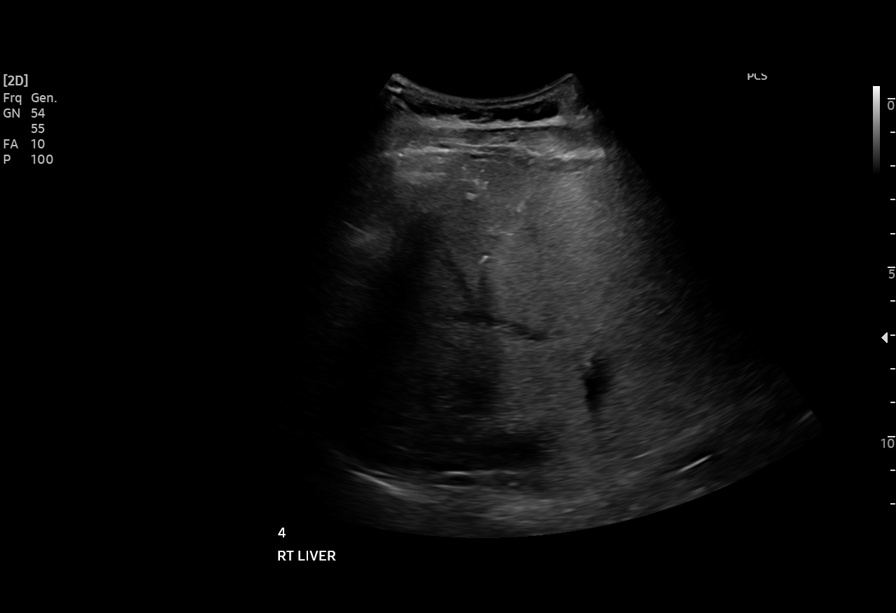
[im 9/9]
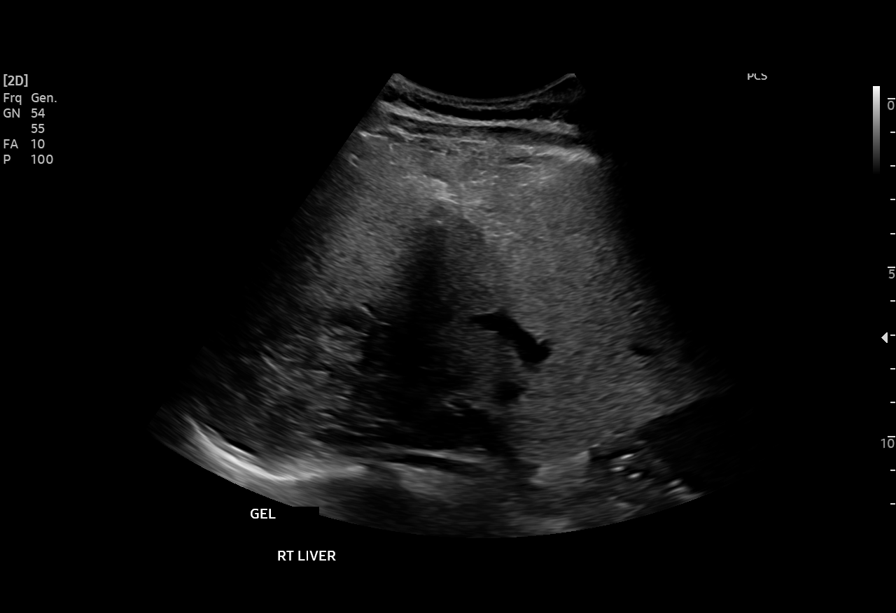

[9 of 9 positions shown; findings below may reference images not displayed]

EXAM:
IMAGE GUIDED MEDICAL LIVER BIOPSY

MEDICATIONS:
None.

ANESTHESIA/SEDATION:
Moderate (conscious) sedation was employed during this procedure. A
total of Versed 2.0 mg and Fentanyl 100 mcg was administered
intravenously.

Moderate Sedation Time: 10 minutes. The patient's level of
consciousness and vital signs were monitored continuously by
radiology nursing throughout the procedure under my direct
supervision.

FLUOROSCOPY TIME:  None

COMPLICATIONS:
None

PROCEDURE:
Informed written consent was obtained from the patient after a
thorough discussion of the procedural risks, benefits and
alternatives. All questions were addressed. Maximal Sterile Barrier
Technique was utilized including caps, mask, sterile gowns, sterile
gloves, sterile drape, hand hygiene and skin antiseptic. A timeout
was performed prior to the initiation of the procedure.

Ultrasound survey of the right liver lobe performed with images
stored and sent to PACs.

The right lower thorax/right upper abdomen was prepped with
chlorhexidine in a sterile fashion, and a sterile drape was applied
covering the operative field. A sterile gown and sterile gloves were
used for the procedure. Local anesthesia was provided with 1%
Lidocaine.

The patient was prepped and draped sterilely and the skin and
subcutaneous tissues were generously infiltrated with 1% lidocaine.

A 17 gauge introducer needle was then advanced under ultrasound
guidance in an intercostal location into the right liver lobe. The
stylet was removed, and multiple separate 18 gauge core biopsy were
retrieved. Samples were placed into formalin for transportation to
the lab.

Gel-Foam pledgets were then infused with a small amount of saline
for assistance with hemostasis.

The needle was removed, and a final ultrasound image was performed.

The patient tolerated the procedure well and remained
hemodynamically stable throughout.

No complications were encountered and no significant blood loss was
encounter.
IMPRESSION: Status post ultrasound-guided medical liver biopsy.

## 2022-08-08 ENCOUNTER — Ambulatory Visit: Payer: 59 | Admitting: Occupational Therapy

## 2022-08-08 DIAGNOSIS — Z9013 Acquired absence of bilateral breasts and nipples: Secondary | ICD-10-CM | POA: Diagnosis not present

## 2022-08-08 DIAGNOSIS — M25612 Stiffness of left shoulder, not elsewhere classified: Secondary | ICD-10-CM

## 2022-08-08 DIAGNOSIS — M25611 Stiffness of right shoulder, not elsewhere classified: Secondary | ICD-10-CM

## 2022-08-08 NOTE — Therapy (Signed)
[facility] [address] [place], [place], [address] Phone: [phone]   Fax:  [phone]  Occupational Therapy Treatment  Patient Details  Name: Kelly Munoz MRN: [id number] Date of Birth: [birth year] Referring Provider (OT): DR Maia Plan   Encounter Date: 08/08/2022   OT End of Session - 08/08/22 1418     Visit Number 6    Number of Visits 6    Date for OT Re-Evaluation 08/08/22    OT Start Time 1345    OT Stop Time 1410    OT Time Calculation (min) 25 min    Activity Tolerance Patient tolerated treatment well    Behavior During Therapy [facility] for tasks assessed/performed             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    Breast cancer (HCC)    Breast cancer, right (HCC) 06/08/2011   Crohn's disease (HCC)    Family history of breast cancer    Family history of colon cancer    Family history of prostate cancer    Heart murmur    History of IBS    History of kidney stones    Hypertension    Lymphocytosis 08/19/2019   Mixed hyperlipidemia    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(HCC)    Nonalcoholic steatohepatitis (NASH)    Parkinson disease 2020   Parkinsonism    Personal history of chemotherapy 2013   RIGHT lumpectomy   Personal history of radiation therapy 2013   RIGHT lumpectomy   PONV (postoperative nausea and vomiting)    nausea   Raynaud's disease    Skin cancer     Past Surgical History:  Procedure Laterality Date   ABDOMINAL HYSTERECTOMY Bilateral    oophorectomy   BREAST BIOPSY Right 2013   lumpectomy chem and rad   BREAST LUMPECTOMY Right 2013   COLONOSCOPY WITH PROPOFOL N/A 02/06/2017   Procedure: COLONOSCOPY WITH PROPOFOL;  Surgeon: Christena Deem, MD;  Location: [facility] ENDOSCOPY;  Service: Endoscopy;  Laterality: N/A;   COLONOSCOPY WITH PROPOFOL N/A 08/02/2021   Procedure:  COLONOSCOPY WITH PROPOFOL;  Surgeon: Toney Reil, MD;  Location: [facility] ENDOSCOPY;  Service: Gastroenterology;  Laterality: N/A;   CYSTOSCOPY N/A 11/09/2015   Procedure: CYSTOSCOPY;  Surgeon: Elenora Fender Ward, MD;  Location: [facility] ORS;  Service: Gynecology;  Laterality: N/A;   parotid stone      Removal   parotid stones Left    PRIMARY CLOSURE Bilateral 05/09/2022   Procedure: ADVANCED CLOSURE;  Surgeon: Peggye Form, DO;  Location: [facility] ORS;  Service: Plastics;  Laterality: Bilateral;   REDUCTION MAMMAPLASTY Bilateral 02/2013   TONSILLECTOMY     TOTAL MASTECTOMY Bilateral 05/09/2022   Procedure: TOTAL MASTECTOMY;  Surgeon: Carolan Shiver, MD;  Location: [facility] ORS;  Service: General;  Laterality: Bilateral;    There were no vitals filed for this visit.   Subjective Assessment - 08/08/22 1417     Subjective  It is still tight in the am - the L still worse than the R - that pect -but using it about 85% or more- not favoring it - back to my boxing classes for my parkinson's    Pertinent History DR CIntron note from 05/26/22 - CHEST: Right breast without any significant fluid. No swelling. Great range of motion of the right upper extremity. Left chest with small seroma in  the medial aspect. Also small amount of fluid on the lateral aspect. No erythema. Good range of motion but still limited.   IMPRESSION:  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 (Z15.01, Z15.09) -S/p bilateral mastectomies with flat aesthetic closure done 05/09/22 -Small seroma of the left chest, currently asymptomatic. Will continue with observation. -Patient with significant improvement of range of motion and getting more comfortable -Will start outpatient referral for exertional therapy  PLAN: 1. Continue the great care you are doing 2. Outpatient referral to Occupational Therapy 3. Can increase activity slowly as tolerated 4. I will see you in 2 weeks for wound follow-up but contact us if you have any concern    Patient Stated Goals  Want to get my motion in my shoulder better that I can return to do all the activities I done before - and prevent lymphedema    Currently in Pain? No/denies                 LYMPHEDEMA/ONCOLOGY QUESTIONNAIRE - 08/08/22 0001       Right Upper Extremity Lymphedema   15 cm Proximal to Olecranon Process 30 cm    10 cm Proximal to Olecranon Process 28 cm    Olecranon Process 24.2 cm      Left Upper Extremity Lymphedema   10 cm Proximal to Olecranon Process 27.5 cm    Olecranon Process 24 cm                 Reassess progress  in bilateral shoulder AROM WFL - close to normal - tight at end range ABD on the L Stiffness mostly in the am over pect Circumference  WNL  compare to prior to surgery - No signs or symptoms of lymphedema Risk for lymphedema low   Pt to cont with posture  Supine  shoulder flexion over head in combination with hip rotation opposite side for extra stretch for lats As well as on wall - shoulder ABD with lateral trunk stretch - can cross leg over away from wall    And ext rotation WNL    Pt is back to her boxing classes - punching bag during her Parkinson's boxing classes  Close to using normally  She can pick up 41 month old grand baby - but did not pick up the 2 yrs old yet- pt going out of country - helping with the grandkids  Pt can initiate wall pushups and planks - then to counter prior to floor over the next few months Keeping it symptoms free               OT Education - 08/08/22 1418     Education Details progress and changes to HEP    Person(s) Educated Patient    Methods Explanation;Demonstration;Tactile cues;Verbal cues    Comprehension Verbal cues required;Returned demonstration;Verbalized understanding                 OT Long Term Goals - 08/08/22 1425       OT LONG TERM GOAL #1   Title Pt to be ind in HEP for AAROM and PROM to increase bilateral shoulder AROM WNL to pull garments over head    Status Achieved       OT LONG TERM GOAL #2   Title Pt to be ind in HEP to decrease scar tissue to increase AROM for bilateral shoulder WNL to return to Tai chi symptoms free    Status Achieved      OT LONG TERM GOAL #3  Title Bilateral UE strength return to prior level for pt to return to exercise activities and playing with grandkids symptoms free    Baseline She started picking up the 55 monhts old but not the 61 yrs old yet -tight in the ame but back to boxing classes for parkinsons    Time 8    Period Weeks    Status On-going    Target Date 10/04/22                   Plan - 08/08/22 1419     Clinical Impression Statement Pt present at OT eval with diagnosis of bilateral mastectomy on 05/09/22.   Pt is now 3 months s/p- AROM and strenght increase greatly in bilateral shoulders. End range R shoulder ABD still feeling tight over pect- but doing her stretches in the am and back to doing her boxing clases for Parkinsons. Pt report back to doing most everything at home. Pt to cont with stretches for few more months - focus on supine shoulder flexion with hip rotation opposite side as well as lateral trunk stretch on wall with arm over head on wall. SHe can start wall pushups and planks- on elbow and then hands - transition gradually to counter and then floor- as long as symptoms free. BIlateral UE circumference cont to be WNL compare to prior to surgery.  Pt going out of country assisting her daughter with grandkids- pt to follow up with me if needed when back in country - otherwise will discharge her. She do participate in Tai Chi classess at the [employer].    OT Occupational Profile and History Problem Focused Assessment - Including review of records relating to presenting problem    Occupational performance deficits (Please refer to evaluation for details): ADL's;IADL's;Play;Social Participation;Leisure    Body Structure / Function / Physical Skills ADL;Flexibility;ROM;UE functional use;Scar  mobility;Strength;Pain;IADL;Edema    Rehab Potential Good    Clinical Decision Making Limited treatment options, no task modification necessary    Comorbidities Affecting Occupational Performance: None    Modification or Assistance to Complete Evaluation  No modification of tasks or assist necessary to complete eval    OT Frequency Monthly    OT Duration 4 weeks    OT Treatment/Interventions Self-care/ADL training;Therapeutic exercise;Patient/family education;Scar mobilization;Manual lymph drainage;Compression bandaging;Manual Therapy;Passive range of motion    Consulted and Agree with Plan of Care Patient             Patient will benefit from skilled therapeutic intervention in order to improve the following deficits and impairments:   Body Structure / Function / Physical Skills: ADL, Flexibility, ROM, UE functional use, Scar mobility, Strength, Pain, IADL, Edema       Visit Diagnosis: History of bilateral mastectomy  Stiffness of left shoulder, not elsewhere classified  Stiffness of right shoulder, not elsewhere classified    Problem List Patient Active Problem List   Diagnosis Date Noted   BRCA gene mutation positive 05/09/2022   Family history of pancreatic cancer 01/27/2022   Indeterminate colitis    Goals of care, counseling/discussion 08/19/2019   History of fatty infiltration of liver 09/18/2018   Parkinsonism 09/18/2018   Tremor 08/10/2017   Hypercalcemia 02/15/2017   Intermittent vertigo 11/05/2016   Nonalcoholic steatohepatitis (NASH) 11/01/2016   Osteopenia 02/14/2016   Family history of breast cancer    Family history of colon cancer    Family history of prostate cancer    History of breast cancer 09/30/2015   BRCA2 genetic  carrier 09/30/2015   Crohn's disease (HCC) 09/30/2015   Mixed hyperlipidemia 09/30/2015   Cancer of right breast with BRCA2 gene mutation (HCC) 09/09/2015   Benign essential hypertension 08/27/2014   Disequilibrium 08/27/2014    Breast cancer, right (HCC) 06/08/2011    [name], [name],[name] 08/08/2022, 2:27 PM  [facility] [address] [place], [address] Phone: [phone]   Fax:  [phone]  Name: Kelly Munoz MRN: [id number] Date of Birth: 09-Sep-1960

## 2022-08-15 ENCOUNTER — Inpatient Hospital Stay: Payer: 59 | Attending: Internal Medicine

## 2022-08-15 DIAGNOSIS — Z9013 Acquired absence of bilateral breasts and nipples: Secondary | ICD-10-CM | POA: Insufficient documentation

## 2022-08-15 DIAGNOSIS — Z923 Personal history of irradiation: Secondary | ICD-10-CM | POA: Diagnosis not present

## 2022-08-15 DIAGNOSIS — Z800 Family history of malignant neoplasm of digestive organs: Secondary | ICD-10-CM

## 2022-08-15 DIAGNOSIS — C50911 Malignant neoplasm of unspecified site of right female breast: Secondary | ICD-10-CM | POA: Insufficient documentation

## 2022-08-15 DIAGNOSIS — Z79810 Long term (current) use of selective estrogen receptor modulators (SERMs): Secondary | ICD-10-CM | POA: Insufficient documentation

## 2022-08-15 DIAGNOSIS — M8580 Other specified disorders of bone density and structure, unspecified site: Secondary | ICD-10-CM

## 2022-08-15 DIAGNOSIS — Z9221 Personal history of antineoplastic chemotherapy: Secondary | ICD-10-CM | POA: Diagnosis not present

## 2022-08-15 DIAGNOSIS — Z1509 Genetic susceptibility to other malignant neoplasm: Secondary | ICD-10-CM

## 2022-08-15 DIAGNOSIS — R978 Other abnormal tumor markers: Secondary | ICD-10-CM | POA: Insufficient documentation

## 2022-08-15 DIAGNOSIS — K7581 Nonalcoholic steatohepatitis (NASH): Secondary | ICD-10-CM

## 2022-08-15 LAB — CBC WITH DIFFERENTIAL/PLATELET
Abs Immature Granulocytes: 0.02 10*3/uL (ref 0.00–0.07)
Basophils Absolute: 0.1 10*3/uL (ref 0.0–0.1)
Basophils Relative: 1 %
Eosinophils Absolute: 0.1 10*3/uL (ref 0.0–0.5)
Eosinophils Relative: 1 %
HCT: 41.2 % (ref 36.0–46.0)
Hemoglobin: 13.4 g/dL (ref 12.0–15.0)
Immature Granulocytes: 0 %
Lymphocytes Relative: 32 %
Lymphs Abs: 2.4 10*3/uL (ref 0.7–4.0)
MCH: 29.5 pg (ref 26.0–34.0)
MCHC: 32.5 g/dL (ref 30.0–36.0)
MCV: 90.7 fL (ref 80.0–100.0)
Monocytes Absolute: 0.4 10*3/uL (ref 0.1–1.0)
Monocytes Relative: 6 %
Neutro Abs: 4.5 10*3/uL (ref 1.7–7.7)
Neutrophils Relative %: 60 %
Platelets: 201 10*3/uL (ref 150–400)
RBC: 4.54 MIL/uL (ref 3.87–5.11)
RDW: 12.8 % (ref 11.5–15.5)
WBC: 7.5 10*3/uL (ref 4.0–10.5)
nRBC: 0 % (ref 0.0–0.2)

## 2022-08-15 LAB — COMPREHENSIVE METABOLIC PANEL
ALT: 59 U/L — ABNORMAL HIGH (ref 0–44)
AST: 103 U/L — ABNORMAL HIGH (ref 15–41)
Albumin: 3.9 g/dL (ref 3.5–5.0)
Alkaline Phosphatase: 53 U/L (ref 38–126)
Anion gap: 7 (ref 5–15)
BUN: 12 mg/dL (ref 6–20)
CO2: 25 mmol/L (ref 22–32)
Calcium: 9.9 mg/dL (ref 8.9–10.3)
Chloride: 104 mmol/L (ref 98–111)
Creatinine, Ser: 0.55 mg/dL (ref 0.44–1.00)
GFR, Estimated: >60 mL/min (ref >60)
Glucose, Bld: 106 mg/dL — ABNORMAL HIGH (ref 70–99)
Potassium: 3.4 mmol/L — ABNORMAL LOW (ref 3.5–5.1)
Sodium: 136 mmol/L (ref 135–145)
Total Bilirubin: 0.4 mg/dL (ref 0.3–1.2)
Total Protein: 7 g/dL (ref 6.5–8.1)

## 2022-08-22 ENCOUNTER — Inpatient Hospital Stay: Payer: 59 | Admitting: Oncology

## 2022-08-22 ENCOUNTER — Encounter: Payer: Self-pay | Admitting: Oncology

## 2022-08-22 VITALS — BP 129/89 | HR 87 | Temp 98.4°F | Resp 18 | Wt 153.9 lb

## 2022-08-22 DIAGNOSIS — R7989 Other specified abnormal findings of blood chemistry: Secondary | ICD-10-CM

## 2022-08-22 DIAGNOSIS — M8580 Other specified disorders of bone density and structure, unspecified site: Secondary | ICD-10-CM

## 2022-08-22 DIAGNOSIS — Z1509 Genetic susceptibility to other malignant neoplasm: Secondary | ICD-10-CM

## 2022-08-22 DIAGNOSIS — Z170 Estrogen receptor positive status [ER+]: Secondary | ICD-10-CM

## 2022-08-22 DIAGNOSIS — C50911 Malignant neoplasm of unspecified site of right female breast: Secondary | ICD-10-CM

## 2022-08-22 DIAGNOSIS — Z1501 Genetic susceptibility to malignant neoplasm of breast: Secondary | ICD-10-CM | POA: Diagnosis not present

## 2022-08-22 DIAGNOSIS — Z800 Family history of malignant neoplasm of digestive organs: Secondary | ICD-10-CM

## 2022-08-22 DIAGNOSIS — K7581 Nonalcoholic steatohepatitis (NASH): Secondary | ICD-10-CM

## 2022-08-22 NOTE — Assessment & Plan Note (Signed)
#  BRCA2 +  S/p TAH -BSO- 11/09/2015  Stable normal CA125.

## 2022-08-22 NOTE — Progress Notes (Addendum)
Hematology/Oncology Progress note [phone] Fax:([phone]       Chief Complaint: Kelly Munoz is a 62 y.o. female with stage IIB Her2/neu + right breast cancer and a BRCA2 mutation who is seen for 6 month assessment.  ASSESSMENT & PLAN:   Cancer Staging  Breast cancer, right [facility]) Staging form: Breast, AJCC 7th Edition - Clinical stage from 08/11/2015: Stage IIB (T2, N1, M0) - Unsigned   Breast cancer, right (HCC) History of Stage IIB Triple positive breast cancer - 05/2011 S/p lumpectomy followed by adjvuant chemotherapy TCH x 6 followed by adjuvant RT, finished 1 year of transtuzumab.  On tamoxifen since 01/2012, Patient is postmenopausal, she prefers to stay on tamoxifen instead of switching to aromatase inhibitor. She also prefers to continue treatment beyond 10 years.  Stable CA 27-29 S/p bilateral risk reduction mastectomy, no need for mammogram and MRI breast.   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  Osteopenia 04/20/2020 DEXA right femur T score -1.7 Continue calcium and vitamin D supplementation.  Family history of pancreatic cancer CA 19.9 elevated slightly, non specific Her father was diagnosed with pancreatic cancer [although father is negative for BRCA2 mutation] She is interested in EUS screening. She continues to follow up with [facility] high risk program and is on protocol of MRI abdomen alternate with EUS every 6 months.   Nonalcoholic steatohepatitis (NASH) Chronically elevated LFT, monitor.  Previous liver biopsy on 11/05/2019 revealed benign hepatic parenchyma with moderate macrovesicular steatosis, mild portal inflammation with focal lobular activity, and focal ballooning degeneration. There was no evidence of malignancy. Follow up with GI  Orders Placed This Encounter  Procedures   Hemochromatosis DNA-PCR(c282y,h63d)    Standing Status:   Future    Standing Expiration Date:   08/22/2023    CBC with Differential (Cancer Center Only)    Standing Status:   Future    Standing Expiration Date:   08/22/2023   CMP (Cancer Center only)    Standing Status:   Future    Standing Expiration Date:   08/22/2023   Cancer antigen 15-3    Standing Status:   Future    Standing Expiration Date:   08/22/2023   Cancer antigen 27.29    Standing Status:   Future    Standing Expiration Date:   08/22/2023   Follow up 6 months All questions were answered. The patient knows to call the clinic with any problems, questions or concerns.  Rickard Patience, MD, PhD [facility] Hematology Oncology 08/22/2022    PERTINENT ONCOLOGY HISTORY Patient previously followed up by Dr.Corcoran, patient switched care to me on 08/27/20 Extensive medical record review was performed by me  06/08/2011. 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  There was solid, cribriform and comedo DCIS with necrosis.  One of 5 lymph nodes were positive.  There was a macro-metastasis in one intramammary lymph node.  Tumor was ER positive (88.8%), PR positive (90.93%), and Her2/neu 3+.  Ki-67 was 48%.   Pathologic stage was T2N1aM0.    Adjuvant chemotherapy  6 cycles of TCH chemotherapy beginning 07/11/2011.  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  She completed 1 year of adjuvant Herceptin.  per Dr.Corcoran's note  her EF decreased (no records available).  EF returned to normal per patient  report (last echo was in 2014).   Adjuvant radiation (completed before the end of 2013).     01/2012 Tamoxifen   My Risk Genetic testing on 07/13/2015 revealed a BRCA2 mutation (c.5946del). 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   Patient has been on breast MRI alternate with mammogram every 6 months.   # she had TAH-  BSO  # Patient follows up with gastroenterology, and had colonoscopy and EGD in 2019.  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 Adriana Simas.  # She was diagnosed with early stage Parkinson's disease.  Tremors have improved on Sinemet.  Paraneoplastic antibody panel was negative on 08/11/2017.  She has been followed by Dr. Malvin Johns, neurologist.  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  # She has a history of lymphocytosis.  Flow cytometry on 08/05/2019 revealed no diagnostic immunophenotypic abnormality. There was absolute lymphocytosis. B-cell clonality was unable to be performed secondary to nonspecific light chain binding. Consider B cell gene rearrangement studies.  08/16/2021, bilateral screening mammogram negative for malignancy.    INTERVAL HISTORY Kelly Munoz is a 62 y.o. female who has above history reviewed by me today presents for follow up visit for history of breast cancer.  05/09/2022 s/p bilateral mastectomy, negative for malignancy Patient takes tamoxifen 20 mg daily.  Patient tolerates well with manageable side effect She has Parkinson's disease, symptoms are stable.    Review of Systems  Constitutional:  Negative for appetite change, chills, fatigue and fever.  HENT:   Negative for hearing loss and voice change.   Eyes:  Negative for eye problems.  Respiratory:  Negative for chest tightness and cough.   Cardiovascular:  Negative for chest pain.  Gastrointestinal:  Negative for abdominal distention, abdominal pain and blood in stool.  Endocrine: Positive for hot flashes.  Genitourinary:  Negative for difficulty urinating and frequency.   Musculoskeletal:  Positive for arthralgias.  Skin:  Negative for itching and rash.  Neurological:  Negative for extremity weakness.  Hematological:  Negative for adenopathy.  Psychiatric/Behavioral:  Negative for confusion.      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    Breast cancer (HCC)    Breast cancer, right (HCC) 06/08/2011   Crohn's disease (HCC)    Family history of breast cancer    Family history of colon cancer    Family history of prostate cancer    Heart murmur    History of IBS    History of kidney stones    Hypertension    Lymphocytosis 08/19/2019   Mixed hyperlipidemia    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(HCC)    Nonalcoholic steatohepatitis (NASH)    Parkinson disease 2020   Parkinsonism    Personal history of chemotherapy 2013   RIGHT lumpectomy   Personal history of radiation therapy 2013   RIGHT lumpectomy   PONV (postoperative nausea and vomiting)    nausea   Raynaud's disease    Skin cancer     Past Surgical History:  Procedure Laterality Date   ABDOMINAL HYSTERECTOMY Bilateral    oophorectomy   BREAST BIOPSY Right 2013   lumpectomy chem and rad   BREAST LUMPECTOMY Right 2013   COLONOSCOPY WITH PROPOFOL N/A 02/06/2017   Procedure: COLONOSCOPY WITH PROPOFOL;  Surgeon: Christena Deem, MD;  Location: [facility] ENDOSCOPY;  Service: Endoscopy;  Laterality: N/A;   COLONOSCOPY WITH PROPOFOL N/A 08/02/2021   Procedure: COLONOSCOPY WITH PROPOFOL;  Surgeon: Toney Reil, MD;  Location: [facility] ENDOSCOPY;  Service: Gastroenterology;  Laterality: N/A;   CYSTOSCOPY N/A 11/09/2015   Procedure: CYSTOSCOPY;  Surgeon: Elenora Fender Ward, MD;  Location: [facility] ORS;  Service: Gynecology;  Laterality: N/A;   parotid stone      Removal   parotid stones Left    PRIMARY CLOSURE Bilateral 05/09/2022   Procedure: ADVANCED CLOSURE;  Surgeon: Peggye Form, DO;  Location: [facility] ORS;  Service: Plastics;  Laterality: Bilateral;   REDUCTION MAMMAPLASTY Bilateral 02/2013   TONSILLECTOMY     TOTAL MASTECTOMY Bilateral 05/09/2022   Procedure: TOTAL MASTECTOMY;  Surgeon: Carolan Shiver, MD;  Location: [facility] ORS;  Service: General;  Laterality: Bilateral;    Family History  Problem Relation Age of Onset   Breast cancer Mother 50   Kidney cancer Father        dx in his mid 91s   Prostate cancer Maternal Uncle        dx in mid 8s   Stomach cancer Paternal Aunt        dx in mid 56s   Diabetes Maternal Grandmother    Heart disease Maternal Grandmother    Prostate cancer Maternal Grandfather    Heart disease Paternal Grandmother    Heart attack Paternal Grandfather    Prostate cancer Maternal Uncle        dx in mid 69s   Prostate cancer Maternal Uncle        dx in mid 4s   Colon cancer Maternal Uncle        dx in mid 51s   Colon cancer Cousin        paternal first cousin dx in his mid 64s   Colon cancer Cousin        paternal first cousin dx in his mid 5s    Social History:  reports that she has never smoked. She has never used smokeless tobacco. She reports that she does not drink alcohol and does not use drugs. She is from [place].  She moved to [place] at the end of 03/2015 to be with her family.  Her parents live in [place].  She lives with her husband.  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    Allergies:  Allergies  Allergen Reactions   Cat Hair Extract Other (See Comments)    Sneezing,watery eyes, throat closes   Zithromax [Azithromycin] Diarrhea   Clindamycin/Lincomycin Rash   Erythromycin Rash    Current Medications: Current Outpatient Medications  Medication Sig Dispense Refill   APRISO 0.375 g 24 hr capsule TAKE 1 CAPSULE BY MOUTH IN THE  MORNING AND AT BEDTIME (Patient taking differently: 375 mg daily.) 180 capsule 3   carbidopa-levodopa (SINEMET IR) 25-100 MG tablet Take 2 tablets by mouth 3 (three) times daily. Take as directed.     Cholecalciferol (VITAMIN D3) 5000 units CAPS Take 5,000 Units by mouth daily.     cyanocobalamin (VITAMIN B12) 500 MCG tablet Take 500 mcg by mouth daily.     fluticasone (FLONASE)  50 MCG/ACT nasal spray Place 1-2 sprays into both nostrils daily as needed for allergies or rhinitis.     metoprolol succinate (TOPROL-XL) 25 MG 24 hr tablet Take 25 mg by mouth daily.     tamoxifen (NOLVADEX) 20 MG tablet TAKE  1 TABLET BY MOUTH DAILY 90 tablet 0   COVID-19 mRNA bivalent vaccine, Pfizer, (PFIZER COVID-19 VAC BIVALENT) injection Inject into the muscle. 0.3 mL 0   HYDROcodone-acetaminophen (NORCO/VICODIN) 5-325 MG tablet Take 1 tablet by mouth every 4 (four) hours as needed for moderate pain. (Patient not taking: Reported on 08/22/2022) 30 tablet 0   lidocaine (LIDODERM) 5 % 1 patch as needed. (Patient not taking: Reported on 08/22/2022)     tretinoin (RETIN-A) 0.05 % cream 1 application  as needed. (Patient not taking: Reported on 08/22/2022)     No current facility-administered medications for this visit.     Performance status (ECOG):  0-1  Vitals Blood pressure 129/89, pulse 87, temperature 98.4 F (36.9 C), resp. rate 18, weight 153 lb 14.4 oz (69.8 kg).   Physical Exam Constitutional:      General: She is not in acute distress.    Appearance: She is not diaphoretic.  Eyes:     General: No scleral icterus. Cardiovascular:     Rate and Rhythm: Normal rate.     Heart sounds: No murmur heard. Pulmonary:     Effort: Pulmonary effort is normal. No respiratory distress.     Breath sounds: No wheezing.  Abdominal:     General: Bowel sounds are normal. There is no distension.     Palpations: Abdomen is soft.  Musculoskeletal:        General: Normal range of motion.     Cervical back: Normal range of motion.  Skin:    General: Skin is warm and dry.  Neurological:     Mental Status: She is alert and oriented to person, place, and time. Mental status is at baseline.     Cranial Nerves: No cranial nerve deficit.     Motor: No abnormal muscle tone.  Psychiatric:        Mood and Affect: Mood and affect normal.     RADIOGRAPHIC STUDIES: I have personally reviewed the  radiological images as listed and agreed with the findings in the report. No results found.  Laboratory Studies.  Patient gets blood work done at [facility].  Results are reviewed and scanned into EMR

## 2022-08-22 NOTE — Progress Notes (Signed)
Pt here for follow up. Pt had bilateral mastectomy the end of April.

## 2022-08-22 NOTE — Assessment & Plan Note (Signed)
04/20/2020 DEXA right femur T score -1.7 Continue calcium and vitamin D supplementation.

## 2022-08-22 NOTE — Assessment & Plan Note (Signed)
Chronically elevated LFT, monitor.  Previous liver biopsy on 11/05/2019 revealed benign hepatic parenchyma with moderate macrovesicular steatosis, mild portal inflammation with focal lobular activity, and focal ballooning degeneration. There was no evidence of malignancy. Follow up with GI

## 2022-08-22 NOTE — Addendum Note (Signed)
Addended by: Rickard Patience on: 08/22/2022 08:11 PM   Modules accepted: Orders

## 2022-08-22 NOTE — Assessment & Plan Note (Signed)
CA 19.9 elevated slightly, non specific Her father was diagnosed with pancreatic cancer [although father is negative for BRCA2 mutation] She is interested in EUS screening. She continues to follow up with [facility] high risk program and is on protocol of MRI abdomen alternate with EUS every 6 months.

## 2022-08-22 NOTE — Assessment & Plan Note (Addendum)
History of Stage IIB Triple positive breast cancer - 05/2011 S/p lumpectomy followed by adjvuant chemotherapy TCH x 6 followed by adjuvant RT, finished 1 year of transtuzumab.  On tamoxifen since 01/2012, Patient is postmenopausal, she prefers to stay on tamoxifen instead of switching to aromatase inhibitor. She also prefers to continue treatment beyond 10 years.  Stable CA 27-29 S/p bilateral risk reduction mastectomy, no need for mammogram and MRI breast.

## 2022-09-06 ENCOUNTER — Other Ambulatory Visit: Payer: Self-pay | Admitting: Oncology

## 2022-11-14 IMAGING — MR MR BREAST BILAT WO/W CM
2 of 8 series · 11 of 48 positions shown · IV contrast (7ml Gadavist)
Comparison: Previous exam(s).

CLINICAL DATA: 59-year-old female who is BRCA 2 positive. History
of treated right breast cancer in 4828 with lumpectomy and
radiation.

LABS:  None performed on site.
EXAM:
BILATERAL BREAST MRI WITH AND WITHOUT CONTRAST
TECHNIQUE: Multiplanar, multisequence MR images of both breasts were obtained
prior to and following the intravenous administration of 7 ml of
Gadavist.

[Series 2: T1 · axial · B · 1.5mm · 1.02mm/px · z∈[-93,+74]mm · 7 of 112 slices shown]
[im 1/112]
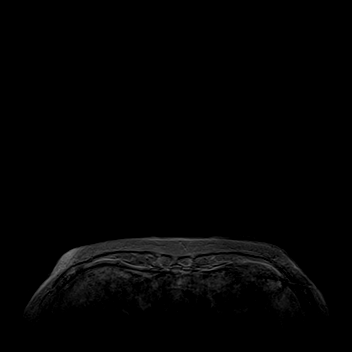
[im 19/112]
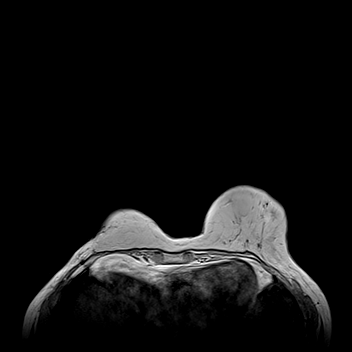
[im 38/112]
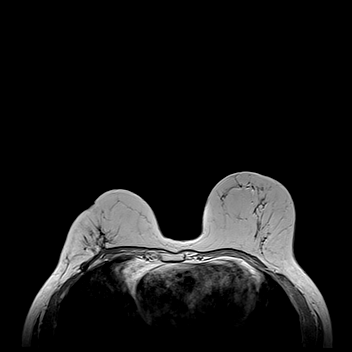
[im 56/112]
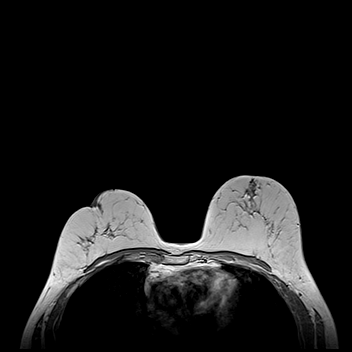
[im 75/112]
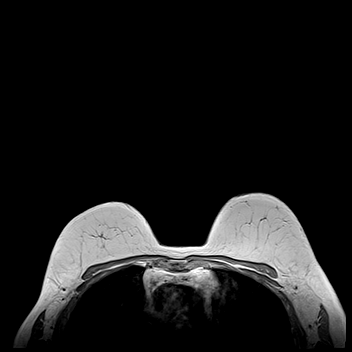
[im 93/112]
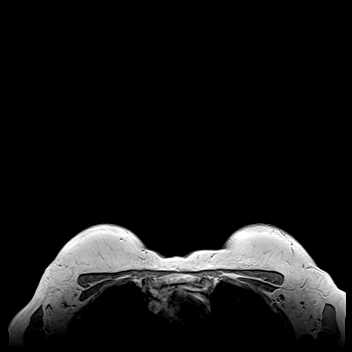
[im 112/112]
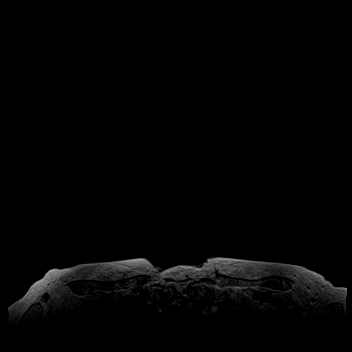

[Series 3: T2 · axial · B · 3.0mm · 1.02mm/px · z∈[-91,+71]mm · 4 of 45 slices shown]
[im 1/45]
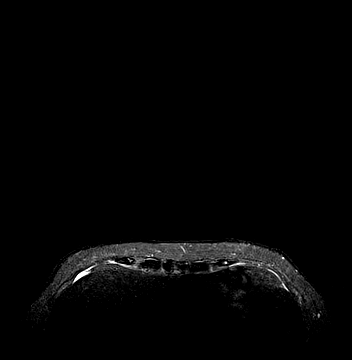
[im 15/45]
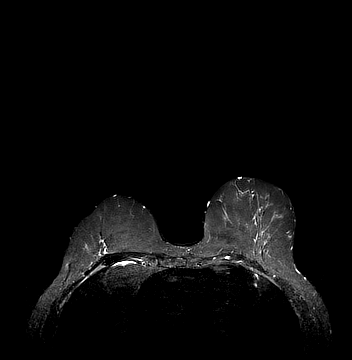
[im 30/45]
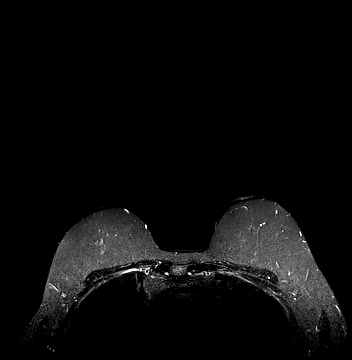
[im 45/45]
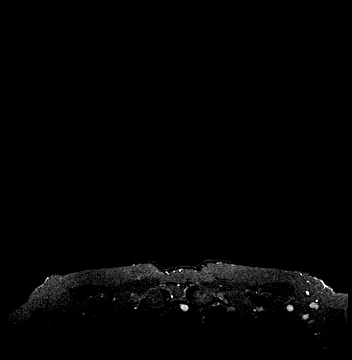

[11 of 48 positions shown; findings below may reference images not displayed]

Three-dimensional MR images were rendered by post-processing of the
original MR data on an independent workstation. The
three-dimensional MR images were interpreted, and findings are
reported in the following complete MRI report for this study. Three
dimensional images were evaluated at the independent interpreting
workstation using the DynaCAD thin client.
FINDINGS: Breast composition: b. Scattered fibroglandular tissue.

Background parenchymal enhancement: Minimal.

Right breast: Stable post lumpectomy scarring in the lower outer
quadrant. No suspicious mass or abnormal enhancement.

Left breast: No suspicious mass or abnormal enhancement.

Lymph nodes: No abnormal appearing lymph nodes.

Ancillary findings:  None.
IMPRESSION: 1. No MRI evidence of malignancy in either breast.
2. Stable right breast postsurgical changes.

RECOMMENDATION:
Routine annual screening. The patient is due for her next bilateral
mammogram in August 2020.

BI-RADS CATEGORY  2: Benign.

## 2022-11-21 ENCOUNTER — Encounter: Payer: Self-pay | Admitting: Oncology

## 2022-11-23 ENCOUNTER — Inpatient Hospital Stay: Payer: 59 | Admitting: Oncology

## 2022-11-23 ENCOUNTER — Encounter: Payer: Self-pay | Admitting: Oncology

## 2022-11-23 ENCOUNTER — Inpatient Hospital Stay: Payer: 59 | Attending: Internal Medicine

## 2022-11-23 VITALS — BP 142/87 | HR 89 | Temp 98.1°F | Resp 18 | Wt 155.3 lb

## 2022-11-23 DIAGNOSIS — Z800 Family history of malignant neoplasm of digestive organs: Secondary | ICD-10-CM | POA: Insufficient documentation

## 2022-11-23 DIAGNOSIS — Z1509 Genetic susceptibility to other malignant neoplasm: Secondary | ICD-10-CM | POA: Insufficient documentation

## 2022-11-23 DIAGNOSIS — Z170 Estrogen receptor positive status [ER+]: Secondary | ICD-10-CM | POA: Insufficient documentation

## 2022-11-23 DIAGNOSIS — Z923 Personal history of irradiation: Secondary | ICD-10-CM | POA: Insufficient documentation

## 2022-11-23 DIAGNOSIS — Z1501 Genetic susceptibility to malignant neoplasm of breast: Secondary | ICD-10-CM

## 2022-11-23 DIAGNOSIS — Z9221 Personal history of antineoplastic chemotherapy: Secondary | ICD-10-CM | POA: Insufficient documentation

## 2022-11-23 DIAGNOSIS — K7581 Nonalcoholic steatohepatitis (NASH): Secondary | ICD-10-CM | POA: Insufficient documentation

## 2022-11-23 DIAGNOSIS — C50911 Malignant neoplasm of unspecified site of right female breast: Secondary | ICD-10-CM | POA: Insufficient documentation

## 2022-11-23 DIAGNOSIS — M8580 Other specified disorders of bone density and structure, unspecified site: Secondary | ICD-10-CM | POA: Diagnosis not present

## 2022-11-23 DIAGNOSIS — R222 Localized swelling, mass and lump, trunk: Secondary | ICD-10-CM | POA: Insufficient documentation

## 2022-11-23 DIAGNOSIS — R7989 Other specified abnormal findings of blood chemistry: Secondary | ICD-10-CM | POA: Insufficient documentation

## 2022-11-23 DIAGNOSIS — Z79810 Long term (current) use of selective estrogen receptor modulators (SERMs): Secondary | ICD-10-CM | POA: Diagnosis not present

## 2022-11-23 DIAGNOSIS — M7989 Other specified soft tissue disorders: Secondary | ICD-10-CM

## 2022-11-23 LAB — CBC WITH DIFFERENTIAL (CANCER CENTER ONLY)
Abs Immature Granulocytes: 0.03 10*3/uL (ref 0.00–0.07)
Basophils Absolute: 0.1 10*3/uL (ref 0.0–0.1)
Basophils Relative: 1 %
Eosinophils Absolute: 0.1 10*3/uL (ref 0.0–0.5)
Eosinophils Relative: 2 %
HCT: 41.6 % (ref 36.0–46.0)
Hemoglobin: 13.5 g/dL (ref 12.0–15.0)
Immature Granulocytes: 0 %
Lymphocytes Relative: 34 %
Lymphs Abs: 2.6 10*3/uL (ref 0.7–4.0)
MCH: 29.7 pg (ref 26.0–34.0)
MCHC: 32.5 g/dL (ref 30.0–36.0)
MCV: 91.6 fL (ref 80.0–100.0)
Monocytes Absolute: 0.5 10*3/uL (ref 0.1–1.0)
Monocytes Relative: 7 %
Neutro Abs: 4.5 10*3/uL (ref 1.7–7.7)
Neutrophils Relative %: 56 %
Platelet Count: 196 10*3/uL (ref 150–400)
RBC: 4.54 MIL/uL (ref 3.87–5.11)
RDW: 13 % (ref 11.5–15.5)
WBC Count: 7.8 10*3/uL (ref 4.0–10.5)
nRBC: 0 % (ref 0.0–0.2)

## 2022-11-23 LAB — CMP (CANCER CENTER ONLY)
ALT: 13 U/L (ref 0–44)
AST: 96 U/L — ABNORMAL HIGH (ref 15–41)
Albumin: 3.9 g/dL (ref 3.5–5.0)
Alkaline Phosphatase: 60 U/L (ref 38–126)
Anion gap: 8 (ref 5–15)
BUN: 9 mg/dL (ref 8–23)
CO2: 26 mmol/L (ref 22–32)
Calcium: 9.5 mg/dL (ref 8.9–10.3)
Chloride: 103 mmol/L (ref 98–111)
Creatinine: 0.45 mg/dL (ref 0.44–1.00)
GFR, Estimated: >60 mL/min (ref >60)
Glucose, Bld: 106 mg/dL — ABNORMAL HIGH (ref 70–99)
Potassium: 3.5 mmol/L (ref 3.5–5.1)
Sodium: 137 mmol/L (ref 135–145)
Total Bilirubin: 0.4 mg/dL (ref <1.2)
Total Protein: 7.2 g/dL (ref 6.5–8.1)

## 2022-11-23 NOTE — Assessment & Plan Note (Signed)
Chronically elevated LFT, monitor.  Previous liver biopsy on 11/05/2019 revealed benign hepatic parenchyma with moderate macrovesicular steatosis, mild portal inflammation with focal lobular activity, and focal ballooning degeneration. There was no evidence of malignancy. Follow up with GI

## 2022-11-23 NOTE — Assessment & Plan Note (Signed)
04/20/2020 DEXA right femur T score -1.7 Continue calcium and vitamin D supplementation.

## 2022-11-23 NOTE — Progress Notes (Signed)
Hematology/Oncology Progress note [phone] [phone]       Chief Complaint: stage IIB Her2/neu + right breast cancer and a BRCA2 mutation   ASSESSMENT & PLAN:   Cancer Staging  Breast cancer, right (HCC) Staging form: Breast, AJCC 7th Edition - Clinical stage from 08/11/2015: Stage IIB (T2, N1, M0) - Unsigned   Breast cancer, right (HCC) History of Stage IIB Triple positive breast cancer - 05/2011 S/p lumpectomy followed by adjvuant chemotherapy TCH x 6 followed by adjuvant RT, finished 1 year of transtuzumab.  On tamoxifen since 01/2012, Patient is postmenopausal, she prefers to stay on tamoxifen instead of switching to aromatase inhibitor. She also prefers to continue treatment beyond 10 years.  Stable CA 27-29 S/p bilateral risk reduction mastectomy, no need for mammogram and MRI breast.  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  Osteopenia 04/20/2020 DEXA right femur T score -1.7 Continue calcium and vitamin D supplementation.  Nonalcoholic steatohepatitis (NASH) Chronically elevated LFT, monitor.  Previous liver biopsy on 11/05/2019 revealed benign hepatic parenchyma with moderate macrovesicular steatosis, mild portal inflammation with focal lobular activity, and focal ballooning degeneration. There was no evidence of malignancy. Follow up with GI  Family history of pancreatic cancer CA 19.9 elevated slightly, non specific Her father was diagnosed with pancreatic cancer [although father is negative for BRCA2 mutation] She is interested in EUS screening. She continues to follow up with [facility] high risk program and is on protocol of MRI abdomen alternate with EUS every 6 months.  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 No evidence of pancreatic ductal dilation.   Chest wall mass Physical examination showed a small smooth  lymph node like lump I recommend [place] evaluation    Orders Placed This Encounter  Procedures   [place] CHEST SOFT TISSUE    Standing Status:   Future    Standing Expiration Date:   11/23/2023    Order Specific Question:   Reason for Exam (SYMPTOM  OR DIAGNOSIS REQUIRED)    Answer:   left chest wall soft tissue mass    Order Specific Question:   Preferred imaging location?    Answer:   [facility]   Cancer antigen 19-9    Standing Status:   Future    Standing Expiration Date:   11/23/2023   Follow up TBD - depending on her work up results.   All questions were answered. The patient knows to call the clinic with any problems, questions or concerns.  Rickard Patience, MD, PhD [facility] Hematology Oncology 11/23/2022    PERTINENT ONCOLOGY HISTORY Patient previously followed up by Dr.Corcoran, patient switched care to me on 08/27/20 Extensive medical record review was performed by me  06/08/2011. 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  There was solid, cribriform and comedo DCIS with necrosis.  One of 5 lymph nodes were positive.  There was a macro-metastasis in one intramammary lymph node.  Tumor was ER positive (88.8%), PR positive (90.93%), and Her2/neu 3+.  Ki-67 was 48%.   Pathologic stage was T2N1aM0.    Adjuvant chemotherapy  6 cycles of TCH chemotherapy beginning 07/11/2011.  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  She completed 1  year of adjuvant Herceptin.  per Dr.Corcoran's note  her EF decreased (no records available).  EF returned to normal per patient report (last echo was in 2014).   Adjuvant radiation (completed before the end of 2013).     01/2012 Tamoxifen   My Risk Genetic testing on 07/13/2015 revealed a BRCA2 mutation (c.5946del). In addition, she has an APC gene variant  of uncertain significance (c.[id number]).   Patient has been on breast MRI alternate with mammogram every 6 months.   # she had TAH- BSO  # Patient follows up with gastroenterology, and had colonoscopy and EGD in 2019.  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 Adriana Simas.  # She was diagnosed with early stage Parkinson's disease.  Tremors have improved on Sinemet.  Paraneoplastic antibody panel was negative on 08/11/2017.  She has been followed by Dr. Malvin Johns, neurologist.  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  # She has a history of lymphocytosis.  Flow cytometry on 08/05/2019 revealed no diagnostic immunophenotypic abnormality. There was absolute lymphocytosis. B-cell clonality was unable to be performed secondary to nonspecific light chain binding. Consider B cell gene rearrangement studies.  08/16/2021, bilateral screening mammogram negative for malignancy.   05/09/2022 s/p bilateral mastectomy, negative for malignancy  INTERVAL HISTORY Kelly Munoz is a 62 y.o. female who has above history reviewed by me today presents for follow up for evaluation of left chest wall lump.  11/18/2022 patient noticed a small lump on the left upper anterior chest wall. Lump is slightly tender. No recent injury/wound infection. No fever, chills. No unintentional weight loss. She has hot flash and chronic sweating episodes.  Patient takes tamoxifen 20 mg daily.   Patient tolerates well with manageable side effect She has Parkinson's disease, symptoms are stable.    Review of Systems  Constitutional:  Negative for appetite change, chills, fatigue and fever.  HENT:   Negative for hearing loss and voice change.   Eyes:  Negative for eye problems.  Respiratory:  Negative for chest tightness and cough.   Cardiovascular:  Negative for chest pain.  Gastrointestinal:  Negative for abdominal distention,  abdominal pain and blood in stool.  Endocrine: Positive for hot flashes.  Genitourinary:  Negative for difficulty urinating and frequency.   Musculoskeletal:  Positive for arthralgias.  Skin:  Negative for itching and rash.  Neurological:  Negative for extremity weakness.  Hematological:  Negative for adenopathy.  Psychiatric/Behavioral:  Negative for confusion.     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    Breast cancer (HCC)    Breast cancer, right (HCC) 06/08/2011   Crohn's disease (HCC)    Family history of breast cancer    Family history of colon cancer    Family history of prostate cancer    Heart murmur    History of IBS    History of kidney stones    Hypertension    Lymphocytosis 08/19/2019   Mixed hyperlipidemia    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(HCC)    Nonalcoholic steatohepatitis (NASH)    Parkinson disease 2020   Parkinsonism    Personal history of chemotherapy 2013   RIGHT lumpectomy   Personal history of radiation therapy 2013   RIGHT lumpectomy   PONV (postoperative nausea and vomiting)    nausea  Raynaud's disease    Skin cancer     Past Surgical History:  Procedure Laterality Date   ABDOMINAL HYSTERECTOMY Bilateral    oophorectomy   BREAST BIOPSY Right 2013   lumpectomy chem and rad   BREAST LUMPECTOMY Right 2013   COLONOSCOPY WITH PROPOFOL N/A 02/06/2017   Procedure: COLONOSCOPY WITH PROPOFOL;  Surgeon: Christena Deem, MD;  Location[facility] ENDOSCOPY;  Service: Endoscopy;  Laterality: N/A;   COLONOSCOPY WITH PROPOFOL N/A 08/02/2021   Procedure: COLONOSCOPY WITH PROPOFOL;  Surgeon: Toney Reil, MD;  Location: [facility] ENDOSCOPY;  Service: Gastroenterology;  Laterality: N/A;   CYSTOSCOPY N/A 11/09/2015   Procedure: CYSTOSCOPY;  Surgeon: Elenora Fender Ward, MD;  Location: [facility] ORS;  Service: Gynecology;  Laterality: N/A;   parotid stone       Removal   parotid stones Left    PRIMARY CLOSURE Bilateral 05/09/2022   Procedure: ADVANCED CLOSURE;  Surgeon: Peggye Form, DO;  Location: [facility] ORS;  Service: Plastics;  Laterality: Bilateral;   REDUCTION MAMMAPLASTY Bilateral 02/2013   TONSILLECTOMY     TOTAL MASTECTOMY Bilateral 05/09/2022   Procedure: TOTAL MASTECTOMY;  Surgeon: Carolan Shiver, MD;  Location: [facility] ORS;  Service: General;  Laterality: Bilateral;    Family History  Problem Relation Age of Onset   Breast cancer Mother 69   Kidney cancer Father        dx in his mid 48s   Prostate cancer Maternal Uncle        dx in mid 56s   Stomach cancer Paternal Aunt        dx in mid 9s   Diabetes Maternal Grandmother    Heart disease Maternal Grandmother    Prostate cancer Maternal Grandfather    Heart disease Paternal Grandmother    Heart attack Paternal Grandfather    Prostate cancer Maternal Uncle        dx in mid 32s   Prostate cancer Maternal Uncle        dx in mid 59s   Colon cancer Maternal Uncle        dx in mid 101s   Colon cancer Cousin        paternal first cousin dx in his mid 47s   Colon cancer Cousin        paternal first cousin dx in his mid 17s    Social History:  reports that she has never smoked. She has never used smokeless tobacco. She reports that she does not drink alcohol and does not use drugs. She is from [place].  She moved to [place] at the end of 03/2015 to be with her family. Her parents live in [place].  She lives with her husband.  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    Allergies:  Allergies  Allergen Reactions   Cat Hair Extract Other (See Comments)    Sneezing,watery eyes, throat closes   Zithromax [Azithromycin] Diarrhea   Clindamycin/Lincomycin Rash   Erythromycin Rash    Current Medications: Current Outpatient Medications  Medication Sig Dispense Refill   APRISO 0.375 g 24 hr capsule TAKE 1 CAPSULE BY MOUTH IN THE  MORNING AND AT BEDTIME  (Patient taking differently: 375 mg daily.) 180 capsule 3   carbidopa-levodopa (SINEMET IR) 25-100 MG tablet Take 2 tablets by mouth 3 (three) times daily. Take as directed.     Cholecalciferol (VITAMIN D3) 5000 units CAPS Take 5,000 Units by mouth daily.     COVID-19 mRNA bivalent vaccine, Pfizer, (  PFIZER COVID-19 VAC BIVALENT) injection Inject into the muscle. 0.3 mL 0   cyanocobalamin (VITAMIN B12) 500 MCG tablet Take 500 mcg by mouth daily.     fluticasone (FLONASE) 50 MCG/ACT nasal spray Place 1-2 sprays into both nostrils daily as needed for allergies or rhinitis.     metoprolol succinate (TOPROL-XL) 25 MG 24 hr tablet Take 25 mg by mouth daily.     tamoxifen (NOLVADEX) 20 MG tablet TAKE 1 TABLET BY MOUTH DAILY 90 tablet 3   HYDROcodone-acetaminophen (NORCO/VICODIN) 5-325 MG tablet Take 1 tablet by mouth every 4 (four) hours as needed for moderate pain. (Patient not taking: Reported on 08/22/2022) 30 tablet 0   lidocaine (LIDODERM) 5 % 1 patch as needed. (Patient not taking: Reported on 08/22/2022)     tretinoin (RETIN-A) 0.05 % cream 1 application  as needed. (Patient not taking: Reported on 08/22/2022)     No current facility-administered medications for this visit.     Performance status (ECOG):  0-1  Vitals Blood pressure (!) 142/87, pulse 89, temperature 98.1 F (36.7 C), temperature source Tympanic, resp. rate 18, weight 155 lb 4.8 oz (70.4 kg), SpO2 98%.   Physical Exam Constitutional:      General: She is not in acute distress.    Appearance: She is not diaphoretic.  Eyes:     General: No scleral icterus. Cardiovascular:     Rate and Rhythm: Normal rate.     Heart sounds: No murmur heard. Pulmonary:     Effort: Pulmonary effort is normal. No respiratory distress.     Breath sounds: No wheezing.  Abdominal:     General: Bowel sounds are normal. There is no distension.     Palpations: Abdomen is soft.  Musculoskeletal:        General: Normal range of motion.      Cervical back: Normal range of motion.  Skin:    General: Skin is warm and dry.  Neurological:     Mental Status: She is alert and oriented to person, place, and time. Mental status is at baseline.     Cranial Nerves: No cranial nerve deficit.     Motor: No abnormal muscle tone.  Psychiatric:        Mood and Affect: Mood and affect normal.   Breast exam is performed in seated and lying down position.  Patient is status post bilateral mastectomy with well-healed surgical scars.   No palpable axillary adenopathy bilaterally.  There is a small smooth mobile lymph node like mass on the lest anterior upper chest wall, right above her removed medi port site.   RADIOGRAPHIC STUDIES: I have personally reviewed the radiological images as listed and agreed with the findings in the report. No results found.  Laboratory Studies.     Latest Ref Rng & Units 11/23/2022    8:14 AM 08/15/2022   10:28 AM 05/10/2022    5:29 AM  CBC  WBC 4.0 - 10.5 K/uL 7.8  7.5  15.4   Hemoglobin 12.0 - 15.0 g/dL 65.7  84.6  96.2   Hematocrit 36.0 - 46.0 % 41.6  41.2  39.6   Platelets 150 - 400 K/uL 196  201  203       Latest Ref Rng & Units 11/23/2022    8:14 AM 08/15/2022   10:28 AM 05/10/2022    5:29 AM  CMP  Glucose 70 - 99 mg/dL 952  841  324   BUN 8 - 23 mg/dL 9  12  11   Creatinine 0.44 - 1.00 mg/dL 2.84  1.32  4.40   Sodium 135 - 145 mmol/L 137  136  139   Potassium 3.5 - 5.1 mmol/L 3.5  3.4  4.1   Chloride 98 - 111 mmol/L 103  104  108   CO2 22 - 32 mmol/L 26  25  24    Calcium 8.9 - 10.3 mg/dL 9.5  9.9  9.5   Total Protein 6.5 - 8.1 g/dL 7.2  7.0    Total Bilirubin <1.2 mg/dL 0.4  0.4    Alkaline Phos 38 - 126 U/L 60  53    AST 15 - 41 U/L 96  103    ALT 0 - 44 U/L 13  59

## 2022-11-23 NOTE — Assessment & Plan Note (Addendum)
History of Stage IIB Triple positive breast cancer - 05/2011 S/p lumpectomy followed by adjvuant chemotherapy TCH x 6 followed by adjuvant RT, finished 1 year of transtuzumab.  On tamoxifen since 01/2012, Patient is postmenopausal, she prefers to stay on tamoxifen instead of switching to aromatase inhibitor. She also prefers to continue treatment beyond 10 years.  Stable CA 27-29 S/p bilateral risk reduction mastectomy, no need for mammogram and MRI breast.

## 2022-11-23 NOTE — Assessment & Plan Note (Signed)
Physical examination showed a small smooth lymph node like lump I recommend [place] evaluation

## 2022-11-23 NOTE — Assessment & Plan Note (Addendum)
CA 19.9 elevated slightly, non specific Her father was diagnosed with pancreatic cancer [although father is negative for BRCA2 mutation] She is interested in EUS screening. She continues to follow up with [facility] high risk program and is on protocol of MRI abdomen alternate with EUS every 6 months.  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 No evidence of pancreatic ductal dilation.

## 2022-11-23 NOTE — Assessment & Plan Note (Signed)
#  BRCA2 +  S/p TAH -BSO- 11/09/2015  Stable normal CA125.

## 2022-11-24 LAB — CANCER ANTIGEN 27.29: CA 27.29: 18.5 U/mL (ref 0.0–38.6)

## 2022-11-24 LAB — CANCER ANTIGEN 15-3: CA 15-3: 17.4 U/mL (ref 0.0–25.0)

## 2022-11-30 ENCOUNTER — Ambulatory Visit
Admission: RE | Admit: 2022-11-30 | Discharge: 2022-11-30 | Disposition: A | Discharge status: Home / Self Care | Payer: 59 | Source: Ambulatory Visit | Attending: Oncology | Admitting: Oncology

## 2022-11-30 DIAGNOSIS — M7989 Other specified soft tissue disorders: Secondary | ICD-10-CM | POA: Diagnosis present

## 2022-12-01 ENCOUNTER — Other Ambulatory Visit: Payer: Self-pay

## 2022-12-01 ENCOUNTER — Telehealth: Payer: Self-pay

## 2022-12-01 DIAGNOSIS — C50911 Malignant neoplasm of unspecified site of right female breast: Secondary | ICD-10-CM

## 2022-12-01 DIAGNOSIS — R222 Localized swelling, mass and lump, trunk: Secondary | ICD-10-CM

## 2022-12-01 NOTE — Telephone Encounter (Signed)
-----   Message from Rickard Patience sent at 11/30/2022  9:21 PM EST ----- Please let Kelly Munoz know that the chest wall mass has benign appearance.  I recommend her to repeat US in 3 months for follow up .  Follow up with me in 6 months. Lab MD cbc cmp CA 27.29, CA 15.3, CA19.9

## 2022-12-01 NOTE — Telephone Encounter (Signed)
Called patient and let her know that the chest wall mass has benign appearance. I informed her that Dr, Cathie Hoops recommends her to repeat US in 3 months for follow up . Also follow up with Dr. Cathie Hoops in 6 months. Lab MD cbc cmp CA 27.29, CA 15.3, CA19.9  I have entered [place] order and lab orders.

## 2022-12-02 LAB — HEMOCHROMATOSIS DNA-PCR(C282Y,H63D)

## 2023-02-13 ENCOUNTER — Ambulatory Visit
Admission: RE | Admit: 2023-02-13 | Discharge: 2023-02-13 | Disposition: A | Discharge status: Home / Self Care | Payer: 59 | Source: Ambulatory Visit | Attending: Oncology | Admitting: Oncology

## 2023-02-13 DIAGNOSIS — R222 Localized swelling, mass and lump, trunk: Secondary | ICD-10-CM | POA: Diagnosis present

## 2023-02-20 ENCOUNTER — Other Ambulatory Visit: Payer: 59

## 2023-02-27 ENCOUNTER — Ambulatory Visit: Payer: 59 | Admitting: Oncology

## 2023-04-13 ENCOUNTER — Telehealth: Payer: Self-pay | Admitting: Gastroenterology

## 2023-04-13 NOTE — Telephone Encounter (Addendum)
 The patient called to inquire about the status of her colonoscopy. I reviewed her records and informed her that there is no indication for a repeat colonoscopy at this time. The patient mentioned that she will check her MyChart and will contact us again if she has any further questions.

## 2023-05-31 ENCOUNTER — Ambulatory Visit: Payer: 59 | Admitting: Oncology

## 2023-05-31 ENCOUNTER — Other Ambulatory Visit: Payer: 59

## 2023-06-07 ENCOUNTER — Ambulatory Visit: Attending: Physician Assistant

## 2023-06-07 DIAGNOSIS — R41841 Cognitive communication deficit: Secondary | ICD-10-CM | POA: Insufficient documentation

## 2023-06-07 DIAGNOSIS — R471 Dysarthria and anarthria: Secondary | ICD-10-CM | POA: Diagnosis present

## 2023-06-07 DIAGNOSIS — R490 Dysphonia: Secondary | ICD-10-CM | POA: Insufficient documentation

## 2023-06-07 NOTE — Therapy (Signed)
 OUTPATIENT SPEECH LANGUAGE PATHOLOGY PARKINSON'S EVALUATION   Patient Name: Kelly Munoz MRN: [id number] DOB:[birth year], 63 y.o., female Today's Date: 06/07/2023  PCP: Kelly Blotter, MD REFERRING PROVIDER: Blanchard Bunk, PA   End of Session - 06/07/23 1025     Visit Number 1    Number of Visits 24    Date for SLP Re-Evaluation 08/30/23    SLP Start Time 0845    SLP Stop Time  0930    SLP Time Calculation (min) 45 min    Activity Tolerance Patient tolerated treatment well             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    Breast cancer (HCC)    Breast cancer, right (HCC) 06/08/2011   Crohn's disease (HCC)    Family history of breast cancer    Family history of colon cancer    Family history of prostate cancer    Heart murmur    History of IBS    History of kidney stones    Hypertension    Lymphocytosis 08/19/2019   Mixed hyperlipidemia    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(HCC)    Nonalcoholic steatohepatitis (NASH)    Parkinson disease (HCC) 2020   Parkinsonism (HCC)    Personal history of chemotherapy 2013   RIGHT lumpectomy   Personal history of radiation therapy 2013   RIGHT lumpectomy   PONV (postoperative nausea and vomiting)    nausea   Raynaud's disease    Skin cancer    Past Surgical History:  Procedure Laterality Date   ABDOMINAL HYSTERECTOMY Bilateral    oophorectomy   BREAST BIOPSY Right 2013   lumpectomy chem and rad   BREAST LUMPECTOMY Right 2013   COLONOSCOPY WITH PROPOFOL  N/A 02/06/2017   Procedure: COLONOSCOPY WITH PROPOFOL ;  Surgeon: Kelly Fly, MD;  Location: [facility] ENDOSCOPY;  Service: Endoscopy;  Laterality: N/A;   COLONOSCOPY WITH PROPOFOL  N/A 08/02/2021   Procedure: COLONOSCOPY WITH PROPOFOL ;  Surgeon: Kelly Daily, MD;  Location: [facility] ENDOSCOPY;  Service: Gastroenterology;  Laterality: N/A;    CYSTOSCOPY N/A 11/09/2015   Procedure: CYSTOSCOPY;  Surgeon: Kelly Shay Ward, MD;  Location: [facility] ORS;  Service: Gynecology;  Laterality: N/A;   parotid stone      Removal   parotid stones Left    PRIMARY CLOSURE Bilateral 05/09/2022   Procedure: ADVANCED CLOSURE;  Surgeon: Kelly Flirt, DO;  Location: [facility] ORS;  Service: Plastics;  Laterality: Bilateral;   REDUCTION MAMMAPLASTY Bilateral 02/2013   TONSILLECTOMY     TOTAL MASTECTOMY Bilateral 05/09/2022   Procedure: TOTAL MASTECTOMY;  Surgeon: Kelly Grego, MD;  Location: [facility] ORS;  Service: General;  Laterality: Bilateral;   Patient Active Problem List   Diagnosis Date Noted   Chest wall mass 11/23/2022   BRCA gene mutation positive 05/09/2022   Family history of pancreatic cancer 01/27/2022   Indeterminate colitis    Goals of care, counseling/discussion 08/19/2019   History of fatty infiltration of liver 09/18/2018   Parkinsonism (HCC) 09/18/2018   Tremor 08/10/2017   Hypercalcemia 02/15/2017   Intermittent vertigo 11/05/2016   Nonalcoholic steatohepatitis (NASH) 11/01/2016   Osteopenia 02/14/2016   Family history of breast cancer    Family history of colon cancer    Family history of prostate cancer    History of breast cancer 09/30/2015   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 09/30/2015   Crohn's disease (HCC) 09/30/2015  Mixed hyperlipidemia 09/30/2015   Cancer of right breast with BRCA2 gene mutation (HCC) 09/09/2015   Benign essential hypertension 08/27/2014   Disequilibrium 08/27/2014   Breast cancer, right (HCC) 06/08/2011    ONSET DATE: 05/18/23 (referral date); diagnosed with Parkinson's in 2020  REFERRING DIAG: Parkinson's disease without dyskinesia, without fluctuating manifestations; hypophonia  THERAPY DIAG:  Cognitive communication deficits  Hypophonia with hoarseness  Dysarthria and anarthria  Rationale for Evaluation and Treatment Rehabilitation  SUBJECTIVE:   SUBJECTIVE STATEMENT: Pt alert,  pleasant, and cooperative.  Pt accompanied by: self  PERTINENT HISTORY: Per Neurology, "Per Neurology note, 01/31/23, "Initial History of Present Illness:Kelly Munoz is a 63 y.o. right handed female who is seen today for evaluation of the above problem. She has a past medical history of BRCA gene mutation positive in female, Breast cancer (CMS/HHS-HCC) (2013), Calculus of parotid gland, Colon polyp, Crohn's disease (CMS/HHS-HCC), Hypertension (2011), IBS (irritable bowel syndrome), Inflammatory bowel disease, Kidney stones (1987), Motion sickness, Parkinsonism (CMS/HHS-HCC), PONV (postoperative nausea and vomiting), and Reynolds syndrome (CMS/HHS-HCC)... Consider Speech Therapy for word finding difficulties."  DIAGNOSTIC FINDINGS: MRI brain, "1. No acute intracranial abnormality identified. 2. Nonspecific T2 FLAIR hyperintense white matter foci probably represent microvascular ischemic changes particularly in the setting of diabetes or hypertension with some differential considerations including migraine headache or sequelae of prior infectious/inflammatory process. No specific findings of demyelination. 3. 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 No  FALLS: Has patient fallen in last 6 months?  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 The total  score is 100 with higher scores indicating better cognitive functioning. Cut off scores of 88 and 82 are recommended for suspicion of dementia (88 has sensitivity of 1.00 and specificity of 0.96, 82 has sensitivity of 0.93 and specificity of 1.00). American Version A  Attention 18/18  Memory 16/26  Fluency 10/14  Language 26/26  Visuospatial 14/16  TOTAL ACE- III Score 84/100      MOTOR SPEECH: Overall motor speech: impaired Level of impairment: all levels Respiration: clavicular breathing Phonation: hoarse Resonance: WFL Articulation: Appears intact Intelligibility: Intelligible Motor planning: Appears intact  ORAL MOTOR EXAMINATION WFL; x voice  OBJECTIVE VOICE ASSESSMENT: Average fundamental frequency:211 Hz   (1 SD below average of  244 Hz +/- 27 for gender)  Oral reading (passage) loudness average: 63-82 dB Oral reading (passage) loudness average: 75 dB Oral reading pitch range: 128-295 Hz Oral reading pitch average: 211 Hz Conversational loudness average: 76 dB Conversational loudness range: 62-86 dB Conversational pitch average: 195 Hz Conversational pitch range: 84-315 Hz Voice quality: hoarse and low vocal intensity; reduced pitch   Patient and/or family report difficulty swallowing which warrants further evaluation; however, pt politely declined instrumental evaluation of swallowing which would be necessary for any direct swallowing treatment. Pt would like to defer at this time and will continue to monitor s/sx.   PATIENT REPORTED OUTCOME MEASURES (PROM):  The Communication Effectiveness Survey is a patient-reported outcome measure in  which the patient rates their own effectiveness in different communication situations. A higher score indicates greater effectiveness.   Pt's self-rating was 20/32.   Having a conversation with a family member or friends at home. 3 Participating in conversation with strangers in a quiet place. 2 Conversing with a familiar  person over the telephone. 3 Conversing with a stranger over the telephone. 2 Being part of a conversation in a noisy environment (social gathering). 2 Speaking to a friend when you are emotionally upset or you are angry. 2 Having a conversation while traveling in a car. 3 Having a conversation with someone at a distance (across a room). 3  TODAY'S TREATMENT:  Pt educated re: role of SLP, rationale for assessment, results of assessment, changes to cognitive-communication and speech in Parkinson's Disease, rationale for treatment, and SLP POC. Pt shown EMST and educated on potential benefit to both speech and swallowing.    PATIENT EDUCATION: Education details: as above Person educated: Patient Education method: Explanation Education comprehension: verbalized understanding; needs reinforcement   HOME EXERCISE PROGRAM: To be provided in upcoming sessions as appropriate   GOALS: Goals reviewed with patient? Yes  SHORT TERM GOALS: Target date: 10 sessions  With Min A, patient will complete a semantic feature analysis with at least 4 relevant features for 4/5 target words to improve word-finding skills.  Baseline: Goal status: INITIAL  2.  With Min A, pt will complete 3/3 vocal ex's (sustained phonation, pitch glides, phrases) at with an average of ~85 dB SPL to improve voice loudness and clarity.   Baseline:  Goal status: INITIAL  3.  To improve carry-over and establish a home exercise program, the patient will complete recommended home exercises 5/7 days per week per patient log.  Baseline:  Goal status: INITIAL  4.   Pt will ID x3 strategies to promote functional attention and memory with min cues.  Baseline:  Goal status: INITIAL    LONG TERM GOALS: Target date: 12 weeks  Pt will use compensatory strategies for anomia with rare cues at the conversation level.  Baseline:  Goal status: INITIAL  2.  Pt will be indep in HEP for voice/dysarthria.  Baseline:  Goal  status: INITIAL  3.  Pt will report improved communication via PROM and/or subjective reports. Baseline: CES 20/32 Goal status: INITIAL    ASSESSMENT:  CLINICAL IMPRESSION: Patient is a 63 y.o. female  who was seen today for Parkinson's Evaluation. Pt with reports of wordfinding difficulty and changes to voice (hypophonia, hoarseness) which are impacting pt socially. Pt reports worsening s/sx at end of the day or when she is "tired." Pt also endorsed changes to swallowing including intermittent globus sensation and coughing like "something went down the wrong way." Pt politely declines instrumental evaluation of swallowing at this time. Assessment completed this date via informal means, acoustic measurements, formal assessment (ACE-III), and PROM (CES). Based on today's assessment, pt presents with mild cognitive-communication difficulty affecting memory, verbal fluency, and visuospatial abilities. Suspect pt with higher level attention deficits not appreciated well on today's assessment given reports of losing train of thought. Pt also with mild hypokinetic dysarthria c/b perceptual and acoustic features of hoarseness, hypophonia, and lower than average pitch. Recommend course of ST to address the above deficits   OBJECTIVE IMPAIRMENTS include memory, expressive language, and dysarthria. These impairments are limiting patient from effectively communicating at home and in community. Factors affecting potential to achieve goals and functional outcome are co-morbidities. Patient will benefit from skilled SLP services to  address above impairments and improve overall function.  REHAB POTENTIAL: Good  PLAN: SLP FREQUENCY: 1-2x/week  SLP DURATION: 12 weeks  PLANNED INTERVENTIONS: Language facilitation, Cueing hierachy, Internal/external aids, Functional tasks, SLP instruction and feedback, Compensatory strategies, and Patient/family education   Dia Forget, M.S., CCC-SLP Speech-Language  Pathologist [place] - [facility] [phone] [address] [place]man)   [facility] at [facility] [address] [place], [place], [address] Phone: [phone]   Fax:  [phone]

## 2023-06-12 ENCOUNTER — Ambulatory Visit: Attending: Physician Assistant

## 2023-06-12 DIAGNOSIS — R490 Dysphonia: Secondary | ICD-10-CM | POA: Diagnosis present

## 2023-06-12 DIAGNOSIS — R471 Dysarthria and anarthria: Secondary | ICD-10-CM | POA: Insufficient documentation

## 2023-06-12 DIAGNOSIS — R41841 Cognitive communication deficit: Secondary | ICD-10-CM | POA: Diagnosis present

## 2023-06-12 NOTE — Therapy (Signed)
 OUTPATIENT SPEECH LANGUAGE PATHOLOGY PARKINSON'S TREATMENT   Patient Name: Kelly Munoz MRN: [id number] DOB:04-02-1960, 63 y.o., female Today's Date: 06/12/2023  PCP: Overton Blotter, MD REFERRING PROVIDER: Blanchard Bunk, PA   End of Session - 06/12/23 1008     Visit Number 2    Number of Visits 24    Date for SLP Re-Evaluation 08/30/23    SLP Start Time 1010    SLP Stop Time  1055    SLP Time Calculation (min) 45 min    Activity Tolerance Patient tolerated treatment well             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    Breast cancer (HCC)    Breast cancer, right (HCC) 06/08/2011   Crohn's disease (HCC)    Family history of breast cancer    Family history of colon cancer    Family history of prostate cancer    Heart murmur    History of IBS    History of kidney stones    Hypertension    Lymphocytosis 08/19/2019   Mixed hyperlipidemia    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(HCC)    Nonalcoholic steatohepatitis (NASH)    Parkinson disease (HCC) 2020   Parkinsonism (HCC)    Personal history of chemotherapy 2013   RIGHT lumpectomy   Personal history of radiation therapy 2013   RIGHT lumpectomy   PONV (postoperative nausea and vomiting)    nausea   Raynaud's disease    Skin cancer    Past Surgical History:  Procedure Laterality Date   ABDOMINAL HYSTERECTOMY Bilateral    oophorectomy   BREAST BIOPSY Right 2013   lumpectomy chem and rad   BREAST LUMPECTOMY Right 2013   COLONOSCOPY WITH PROPOFOL  N/A 02/06/2017   Procedure: COLONOSCOPY WITH PROPOFOL ;  Surgeon: Deveron Fly, MD;  Location: [facility] ENDOSCOPY;  Service: Endoscopy;  Laterality: N/A;   COLONOSCOPY WITH PROPOFOL  N/A 08/02/2021   Procedure: COLONOSCOPY WITH PROPOFOL ;  Surgeon: Selena Daily, MD;  Location: [facility] ENDOSCOPY;  Service: Gastroenterology;  Laterality: N/A;    CYSTOSCOPY N/A 11/09/2015   Procedure: CYSTOSCOPY;  Surgeon: Margarie Shay Ward, MD;  Location: [facility] ORS;  Service: Gynecology;  Laterality: N/A;   parotid stone      Removal   parotid stones Left    PRIMARY CLOSURE Bilateral 05/09/2022   Procedure: ADVANCED CLOSURE;  Surgeon: Thornell Flirt, DO;  Location: [facility] ORS;  Service: Plastics;  Laterality: Bilateral;   REDUCTION MAMMAPLASTY Bilateral 02/2013   TONSILLECTOMY     TOTAL MASTECTOMY Bilateral 05/09/2022   Procedure: TOTAL MASTECTOMY;  Surgeon: Eldred Grego, MD;  Location: [facility] ORS;  Service: General;  Laterality: Bilateral;   Patient Active Problem List   Diagnosis Date Noted   Chest wall mass 11/23/2022   BRCA gene mutation positive 05/09/2022   Family history of pancreatic cancer 01/27/2022   Indeterminate colitis    Goals of care, counseling/discussion 08/19/2019   History of fatty infiltration of liver 09/18/2018   Parkinsonism (HCC) 09/18/2018   Tremor 08/10/2017   Hypercalcemia 02/15/2017   Intermittent vertigo 11/05/2016   Nonalcoholic steatohepatitis (NASH) 11/01/2016   Osteopenia 02/14/2016   Family history of breast cancer    Family history of colon cancer    Family history of prostate cancer    History of breast cancer 09/30/2015   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 09/30/2015   Crohn's disease (HCC) 09/30/2015  Mixed hyperlipidemia 09/30/2015   Cancer of right breast with BRCA2 gene mutation (HCC) 09/09/2015   Benign essential hypertension 08/27/2014   Disequilibrium 08/27/2014   Breast cancer, right (HCC) 06/08/2011    ONSET DATE: 05/18/23 (referral date); diagnosed with Parkinson's in 2020  REFERRING DIAG: Parkinson's disease without dyskinesia, without fluctuating manifestations; hypophonia  THERAPY DIAG:  Cognitive communication deficits  Hypophonia with hoarseness  Dysarthria and anarthria  Rationale for Evaluation and Treatment Rehabilitation  SUBJECTIVE:   SUBJECTIVE STATEMENT: Pt alert,  pleasant, and cooperative.  Pt accompanied by: self  PERTINENT HISTORY: Per Neurology, "Per Neurology note, 01/31/23, "Initial History of Present Illness:Kelly Munoz is a 63 y.o. right handed female who is seen today for evaluation of the above problem. She has a past medical history of BRCA gene mutation positive in female, Breast cancer (CMS/HHS-HCC) (2013), Calculus of parotid gland, Colon polyp, Crohn's disease (CMS/HHS-HCC), Hypertension (2011), IBS (irritable bowel syndrome), Inflammatory bowel disease, Kidney stones (1987), Motion sickness, Parkinsonism (CMS/HHS-HCC), PONV (postoperative nausea and vomiting), and Reynolds syndrome (CMS/HHS-HCC)... Consider Speech Therapy for word finding difficulties."  DIAGNOSTIC FINDINGS: MRI brain, "1. No acute intracranial abnormality identified. 2. Nonspecific T2 FLAIR hyperintense white matter foci probably represent microvascular ischemic changes particularly in the setting of diabetes or hypertension with some differential considerations including migraine headache or sequelae of prior infectious/inflammatory process. No specific findings of demyelination. 3. 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 No  FALLS: Has patient fallen in last 6 months?  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 Education completed via rationale as well as changes to speech in setting of Parkinsonism. Pt endorsed monopitch while reading aloud to granddaughter. Pt hopeful that HEP will help with pitch, loudness, and vocal quality.  Pt completed the  following with min/mod cues Sustained /a/: x10, 84 dB Ascending pitch glides x10, 80 dB Descending pitch glides: x10, 85 db   Pt read x10 functional phrases twice through with loud and intentional voice, averaging 85 dB.   Pt benefited from use of biofeeback using Voice Analyst app.    Introduced EMST for improved breath support. Training threshold obtained. Pt completed 5 sets of 5 breaths at 55cmH20.  Introduced Engineer, agricultural for improved wordfinding. Pt utlized during structured task with min cueing.   PATIENT EDUCATION: Education details: as above Person educated: Patient Education method: Explanation Education comprehension: verbalized understanding; needs reinforcement   HOME EXERCISE PROGRAM: Intensity based HEP EMST   GOALS: Goals reviewed with patient? Yes  SHORT TERM GOALS: Target date: 10 sessions  With Min A, patient will complete a semantic feature analysis with at least 4 relevant features for 4/5 target words to improve word-finding skills.  Baseline: Goal status: INITIAL  2.  With Min A, pt will complete 3/3 vocal ex's (sustained phonation, pitch glides, phrases) at with an average of ~85 dB SPL to improve voice loudness and clarity.   Baseline:  Goal status: INITIAL  3.  To improve carry-over and establish a home exercise program, the patient will complete recommended home exercises 5/7 days per week per patient log.  Baseline:  Goal status: INITIAL  4.  Pt will ID x3 strategies to promote functional attention and memory with min cues.  Baseline:  Goal status: INITIAL    LONG TERM GOALS: Target date: 12 weeks  Pt will use compensatory strategies for anomia with rare cues at the conversation level.  Baseline:  Goal status: INITIAL  2.  Pt will be indep in HEP for voice/dysarthria.  Baseline:  Goal status: INITIAL  3.  Pt will report improved communication via PROM and/or subjective reports. Baseline: CES 20/32 Goal status:  INITIAL    ASSESSMENT:  CLINICAL IMPRESSION: Patient is a 63 y.o. female  who was seen today for cognitive-communication treatment in setting of Parkinsonism. Pt with reports of wordfinding difficulty and changes to voice (hypophonia, hoarseness) which are impacting pt socially. Pt reports worsening s/sx at end of the day or when she is "tired." Pt also endorsed changes to swallowing including intermittent globus sensation and coughing like "something went down the wrong way." Pt politely declines instrumental evaluation of swallowing at this time. Initial assessment completed via informal means, acoustic measurements, formal assessment (ACE-III), and PROM (CES). Based on initial assessment, pt presents with mild cognitive-communication difficulty affecting memory, verbal fluency, and visuospatial abilities. Suspect pt with higher level attention deficits not appreciated well on today's assessment given reports of losing train of thought. Pt also with mild hypokinetic dysarthria c/b perceptual and acoustic features of hoarseness, hypophonia, and lower than average pitch. Recommend course of ST to address the above deficits.   OBJECTIVE IMPAIRMENTS include memory, expressive language, and dysarthria. These impairments are limiting patient from effectively communicating at home and in community. Factors affecting potential to achieve goals and functional outcome are co-morbidities. Patient will benefit from skilled SLP services to address above impairments and improve overall function.  REHAB POTENTIAL: Good  PLAN: SLP FREQUENCY: 1-2x/week  SLP DURATION: 12 weeks  PLANNED INTERVENTIONS: Language facilitation, Cueing hierachy, Internal/external aids, Functional tasks, SLP instruction and feedback, Compensatory strategies, and Patient/family education   Dia Forget, M.S., CCC-SLP Speech-Language Pathologist [place] - [facility] [phone] [place]man)   [facility] at [facility] [address] [place], [place], [address] Phone: [phone]   Fax:  [phone]

## 2023-06-14 ENCOUNTER — Inpatient Hospital Stay: Attending: Oncology

## 2023-06-14 ENCOUNTER — Ambulatory Visit: Attending: Physician Assistant

## 2023-06-14 ENCOUNTER — Encounter: Payer: Self-pay | Admitting: Oncology

## 2023-06-14 ENCOUNTER — Inpatient Hospital Stay (HOSPITAL_BASED_OUTPATIENT_CLINIC_OR_DEPARTMENT_OTHER): Admitting: Oncology

## 2023-06-14 VITALS — BP 143/91 | HR 90 | Temp 98.8°F | Resp 16 | Wt 156.0 lb

## 2023-06-14 DIAGNOSIS — C50911 Malignant neoplasm of unspecified site of right female breast: Secondary | ICD-10-CM | POA: Insufficient documentation

## 2023-06-14 DIAGNOSIS — Z90710 Acquired absence of both cervix and uterus: Secondary | ICD-10-CM | POA: Insufficient documentation

## 2023-06-14 DIAGNOSIS — Z79810 Long term (current) use of selective estrogen receptor modulators (SERMs): Secondary | ICD-10-CM | POA: Diagnosis not present

## 2023-06-14 DIAGNOSIS — R471 Dysarthria and anarthria: Secondary | ICD-10-CM | POA: Diagnosis present

## 2023-06-14 DIAGNOSIS — Z79899 Other long term (current) drug therapy: Secondary | ICD-10-CM | POA: Diagnosis not present

## 2023-06-14 DIAGNOSIS — R222 Localized swelling, mass and lump, trunk: Secondary | ICD-10-CM | POA: Diagnosis not present

## 2023-06-14 DIAGNOSIS — Z1509 Genetic susceptibility to other malignant neoplasm: Secondary | ICD-10-CM | POA: Insufficient documentation

## 2023-06-14 DIAGNOSIS — Z1731 Human epidermal growth factor receptor 2 positive status: Secondary | ICD-10-CM | POA: Insufficient documentation

## 2023-06-14 DIAGNOSIS — Z1501 Genetic susceptibility to malignant neoplasm of breast: Secondary | ICD-10-CM | POA: Insufficient documentation

## 2023-06-14 DIAGNOSIS — Z9221 Personal history of antineoplastic chemotherapy: Secondary | ICD-10-CM | POA: Diagnosis not present

## 2023-06-14 DIAGNOSIS — Z170 Estrogen receptor positive status [ER+]: Secondary | ICD-10-CM

## 2023-06-14 DIAGNOSIS — Z800 Family history of malignant neoplasm of digestive organs: Secondary | ICD-10-CM

## 2023-06-14 DIAGNOSIS — M8580 Other specified disorders of bone density and structure, unspecified site: Secondary | ICD-10-CM | POA: Diagnosis not present

## 2023-06-14 DIAGNOSIS — K7581 Nonalcoholic steatohepatitis (NASH): Secondary | ICD-10-CM

## 2023-06-14 DIAGNOSIS — R41841 Cognitive communication deficit: Secondary | ICD-10-CM | POA: Diagnosis present

## 2023-06-14 DIAGNOSIS — R978 Other abnormal tumor markers: Secondary | ICD-10-CM | POA: Insufficient documentation

## 2023-06-14 DIAGNOSIS — R490 Dysphonia: Secondary | ICD-10-CM | POA: Diagnosis present

## 2023-06-14 DIAGNOSIS — Z1721 Progesterone receptor positive status: Secondary | ICD-10-CM | POA: Insufficient documentation

## 2023-06-14 DIAGNOSIS — Z923 Personal history of irradiation: Secondary | ICD-10-CM | POA: Diagnosis not present

## 2023-06-14 DIAGNOSIS — Z90722 Acquired absence of ovaries, bilateral: Secondary | ICD-10-CM | POA: Diagnosis not present

## 2023-06-14 LAB — CBC WITH DIFFERENTIAL (CANCER CENTER ONLY)
Abs Immature Granulocytes: 0.02 10*3/uL (ref 0.00–0.07)
Basophils Absolute: 0.1 10*3/uL (ref 0.0–0.1)
Basophils Relative: 1 %
Eosinophils Absolute: 0.1 10*3/uL (ref 0.0–0.5)
Eosinophils Relative: 2 %
HCT: 42.4 % (ref 36.0–46.0)
Hemoglobin: 13.5 g/dL (ref 12.0–15.0)
Immature Granulocytes: 0 %
Lymphocytes Relative: 36 %
Lymphs Abs: 2.8 10*3/uL (ref 0.7–4.0)
MCH: 29.2 pg (ref 26.0–34.0)
MCHC: 31.8 g/dL (ref 30.0–36.0)
MCV: 91.8 fL (ref 80.0–100.0)
Monocytes Absolute: 0.5 10*3/uL (ref 0.1–1.0)
Monocytes Relative: 6 %
Neutro Abs: 4.4 10*3/uL (ref 1.7–7.7)
Neutrophils Relative %: 55 %
Platelet Count: 193 10*3/uL (ref 150–400)
RBC: 4.62 MIL/uL (ref 3.87–5.11)
RDW: 13 % (ref 11.5–15.5)
WBC Count: 7.9 10*3/uL (ref 4.0–10.5)
nRBC: 0 % (ref 0.0–0.2)

## 2023-06-14 LAB — CMP (CANCER CENTER ONLY)
ALT: 24 U/L (ref 0–44)
AST: 153 U/L — ABNORMAL HIGH (ref 15–41)
Albumin: 3.9 g/dL (ref 3.5–5.0)
Alkaline Phosphatase: 62 U/L (ref 38–126)
Anion gap: 9 (ref 5–15)
BUN: 12 mg/dL (ref 8–23)
CO2: 26 mmol/L (ref 22–32)
Calcium: 9.9 mg/dL (ref 8.9–10.3)
Chloride: 103 mmol/L (ref 98–111)
Creatinine: 0.59 mg/dL (ref 0.44–1.00)
GFR, Estimated: >60 mL/min (ref >60)
Glucose, Bld: 108 mg/dL — ABNORMAL HIGH (ref 70–99)
Potassium: 3.6 mmol/L (ref 3.5–5.1)
Sodium: 138 mmol/L (ref 135–145)
Total Bilirubin: 0.6 mg/dL (ref 0.0–1.2)
Total Protein: 7 g/dL (ref 6.5–8.1)

## 2023-06-14 NOTE — Assessment & Plan Note (Signed)
CA 19.9 elevated slightly, non specific Her father was diagnosed with pancreatic cancer [although father is negative for BRCA2 mutation] She is interested in EUS screening. She continues to follow up with [facility] high risk program and is on protocol of MRI abdomen alternate with EUS every 6 months.

## 2023-06-14 NOTE — Therapy (Signed)
 OUTPATIENT SPEECH LANGUAGE PATHOLOGY PARKINSON'S TREATMENT   Patient Name: Kelly Munoz MRN: [id number] DOB:[birth year], 63 y.o., female Today's Date: 06/14/2023  PCP: Kelly Blotter, MD REFERRING PROVIDER: Blanchard Bunk, PA   End of Session - 06/14/23 1023     Visit Number 3    Number of Visits 24    Date for SLP Re-Evaluation 08/30/23    SLP Start Time 0930    SLP Stop Time  1015    SLP Time Calculation (min) 45 min    Activity Tolerance Patient tolerated treatment well             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    Breast cancer (HCC)    Breast cancer, right (HCC) 06/08/2011   Crohn's disease (HCC)    Family history of breast cancer    Family history of colon cancer    Family history of prostate cancer    Heart murmur    History of IBS    History of kidney stones    Hypertension    Lymphocytosis 08/19/2019   Mixed hyperlipidemia    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(HCC)    Nonalcoholic steatohepatitis (NASH)    Parkinson disease (HCC) 2020   Parkinsonism (HCC)    Personal history of chemotherapy 2013   RIGHT lumpectomy   Personal history of radiation therapy 2013   RIGHT lumpectomy   PONV (postoperative nausea and vomiting)    nausea   Raynaud's disease    Skin cancer    Past Surgical History:  Procedure Laterality Date   ABDOMINAL HYSTERECTOMY Bilateral    oophorectomy   BREAST BIOPSY Right 2013   lumpectomy chem and rad   BREAST LUMPECTOMY Right 2013   COLONOSCOPY WITH PROPOFOL  N/A 02/06/2017   Procedure: COLONOSCOPY WITH PROPOFOL ;  Surgeon: Kelly Fly, MD;  Location: [facility] ENDOSCOPY;  Service: Endoscopy;  Laterality: N/A;   COLONOSCOPY WITH PROPOFOL  N/A 08/02/2021   Procedure: COLONOSCOPY WITH PROPOFOL ;  Surgeon: Kelly Daily, MD;  Location: [facility] ENDOSCOPY;  Service: Gastroenterology;  Laterality: N/A;    CYSTOSCOPY N/A 11/09/2015   Procedure: CYSTOSCOPY;  Surgeon: Kelly Shay Ward, MD;  Location: [facility] ORS;  Service: Gynecology;  Laterality: N/A;   parotid stone      Removal   parotid stones Left    PRIMARY CLOSURE Bilateral 05/09/2022   Procedure: ADVANCED CLOSURE;  Surgeon: Kelly Flirt, DO;  Location: [facility] ORS;  Service: Plastics;  Laterality: Bilateral;   REDUCTION MAMMAPLASTY Bilateral 02/2013   TONSILLECTOMY     TOTAL MASTECTOMY Bilateral 05/09/2022   Procedure: TOTAL MASTECTOMY;  Surgeon: Kelly Grego, MD;  Location: [facility] ORS;  Service: General;  Laterality: Bilateral;   Patient Active Problem List   Diagnosis Date Noted   Chest wall mass 11/23/2022   BRCA gene mutation positive 05/09/2022   Family history of pancreatic cancer 01/27/2022   Indeterminate colitis    Goals of care, counseling/discussion 08/19/2019   History of fatty infiltration of liver 09/18/2018   Parkinsonism (HCC) 09/18/2018   Tremor 08/10/2017   Hypercalcemia 02/15/2017   Intermittent vertigo 11/05/2016   Nonalcoholic steatohepatitis (NASH) 11/01/2016   Osteopenia 02/14/2016   Family history of breast cancer    Family history of colon cancer    Family history of prostate cancer    History of breast cancer 09/30/2015   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 09/30/2015   Crohn's disease (HCC) 09/30/2015  Mixed hyperlipidemia 09/30/2015   Cancer of right breast with BRCA2 gene mutation (HCC) 09/09/2015   Benign essential hypertension 08/27/2014   Disequilibrium 08/27/2014   Breast cancer, right (HCC) 06/08/2011    ONSET DATE: 05/18/23 (referral date); diagnosed with Parkinson's in 2020  REFERRING DIAG: Parkinson's disease without dyskinesia, without fluctuating manifestations; hypophonia  THERAPY DIAG:  Cognitive communication deficits  Hypophonia with hoarseness  Dysarthria and anarthria  Rationale for Evaluation and Treatment Rehabilitation  SUBJECTIVE:   SUBJECTIVE STATEMENT: Pt alert,  pleasant, and cooperative.  Pt accompanied by: self  PERTINENT HISTORY: Per Neurology, "Per Neurology note, 01/31/23, "Initial History of Present Illness:Kelly Munoz is a 63 y.o. right handed female who is seen today for evaluation of the above problem. She has a past medical history of BRCA gene mutation positive in female, Breast cancer (CMS/HHS-HCC) (2013), Calculus of parotid gland, Colon polyp, Crohn's disease (CMS/HHS-HCC), Hypertension (2011), IBS (irritable bowel syndrome), Inflammatory bowel disease, Kidney stones (1987), Motion sickness, Parkinsonism (CMS/HHS-HCC), PONV (postoperative nausea and vomiting), and Reynolds syndrome (CMS/HHS-HCC)... Consider Speech Therapy for word finding difficulties."  DIAGNOSTIC FINDINGS: MRI brain, "1. No acute intracranial abnormality identified. 2. Nonspecific T2 FLAIR hyperintense white matter foci probably represent microvascular ischemic changes particularly in the setting of diabetes or hypertension with some differential considerations including migraine headache or sequelae of prior infectious/inflammatory process. No specific findings of demyelination. 3. 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 No  FALLS: Has patient fallen in last 6 months?  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 Continued education completed via rationale as well as changes to speech in setting of Parkinsonism.   Pt completed the following with min/mod cues Sustained /a/: x10, 84 dB Ascending pitch glides x10, 84 dB Descending pitch glides: x10, 85 db    Pt read x10 functional phrases twice through with loud and intentional voice, averaging 84 dB.   Pt benefited from use of biofeeback using Voice Analyst app.    Introduced EMST for improved breath support. Pt completed 5 sets of 5 breaths at 55cmH20.  Reviewed semantic features analysis for improved wordfinding. Pt utlized during structured task with no more than min cueing.   PATIENT EDUCATION: Education details: as above Person educated: Patient Education method: Explanation Education comprehension: verbalized understanding; needs reinforcement   HOME EXERCISE PROGRAM: Intensity based HEP EMST   GOALS: Goals reviewed with patient? Yes  SHORT TERM GOALS: Target date: 10 sessions  With Min A, patient will complete a semantic feature analysis with at least 4 relevant features for 4/5 target words to improve word-finding skills.  Baseline: Goal status: INITIAL  2.  With Min A, pt will complete 3/3 vocal ex's (sustained phonation, pitch glides, phrases) at with an average of ~85 dB SPL to improve voice loudness and clarity.   Baseline:  Goal status: INITIAL  3.  To improve carry-over and establish a home exercise program, the patient will complete recommended home exercises 5/7 days per week per patient log.  Baseline:  Goal status: INITIAL  4.   Pt will ID x3 strategies to promote functional attention and memory with min cues.  Baseline:  Goal  status: INITIAL    LONG TERM GOALS: Target date: 12 weeks  Pt will use compensatory strategies for anomia with rare cues at the conversation level.  Baseline:  Goal status: INITIAL  2.  Pt will be indep in HEP for voice/dysarthria.  Baseline:  Goal status: INITIAL  3.  Pt will report improved communication via PROM and/or subjective reports. Baseline: CES 20/32 Goal status: INITIAL    ASSESSMENT:  CLINICAL IMPRESSION: Patient is a 63 y.o. female  who was seen today for cognitive-communication treatment in setting  of Parkinsonism. Pt with reports of wordfinding difficulty and changes to voice (hypophonia, hoarseness) which are impacting pt socially. Pt reports worsening s/sx at end of the day or when she is "tired." Pt also endorsed changes to swallowing including intermittent globus sensation and coughing like "something went down the wrong way." Pt politely declines instrumental evaluation of swallowing at this time. Initial assessment completed via informal means, acoustic measurements, formal assessment (ACE-III), and PROM (CES). Based on initial assessment, pt presents with mild cognitive-communication difficulty affecting memory, verbal fluency, and visuospatial abilities. Suspect pt with higher level attention deficits not appreciated well on today's assessment given reports of losing train of thought. Pt also with mild hypokinetic dysarthria c/b perceptual and acoustic features of hoarseness, hypophonia, and lower than average pitch. Recommend course of ST to address the above deficits.   OBJECTIVE IMPAIRMENTS include memory, expressive language, and dysarthria. These impairments are limiting patient from effectively communicating at home and in community. Factors affecting potential to achieve goals and functional outcome are co-morbidities. Patient will benefit from skilled SLP services to address above impairments and improve overall function.  REHAB POTENTIAL: Good  PLAN: SLP FREQUENCY: 1-2x/week  SLP DURATION: 12 weeks  PLANNED INTERVENTIONS: Language facilitation, Cueing hierachy, Internal/external aids, Functional tasks, SLP instruction and feedback, Compensatory strategies, and Patient/family education   Dia Forget, M.S., CCC-SLP Speech-Language Pathologist [facility] [phone] [place])   [facility] [address] [place], [place], [address] Phone: [phone]   Fax:   [phone]

## 2023-06-14 NOTE — Assessment & Plan Note (Signed)
Chronically elevated LFT, monitor.  Previous liver biopsy on 11/05/2019 revealed benign hepatic parenchyma with moderate macrovesicular steatosis, mild portal inflammation with focal lobular activity, and focal ballooning degeneration. There was no evidence of malignancy. Follow up with GI

## 2023-06-14 NOTE — Assessment & Plan Note (Addendum)
 History of Stage IIB Triple positive breast cancer - 05/2011 S/p lumpectomy followed by adjvuant chemotherapy TCH x 6 followed by adjuvant RT, finished 1 year of transtuzumab.  On tamoxifen  since 01/2012, Patient is postmenopausal, she prefers to stay on tamoxifen  instead of switching to aromatase inhibitor.   S/p bilateral risk reduction mastectomy, no need for mammogram and MRI breast. We discussed about the minimal benefit of tamoxifen  use beyond 10 years, especially after bilateral mastectomy.  She prefers to continue Tamoxifen  treatment.  Stable CA 27-29

## 2023-06-14 NOTE — Progress Notes (Signed)
 Hematology/Oncology Progress note [phone] [phone]       Chief Complaint: stage IIB Her2/neu + right breast cancer and a BRCA2 mutation   ASSESSMENT & PLAN:   Cancer Staging  Breast cancer, right (HCC) Staging form: Breast, AJCC 7th Edition - Clinical stage from 08/11/2015: Stage IIB (T2, N1, M0) - Unsigned   Breast cancer, right (HCC) History of Stage IIB Triple positive breast cancer - 05/2011 S/p lumpectomy followed by adjvuant chemotherapy TCH x 6 followed by adjuvant RT, finished 1 year of transtuzumab.  On tamoxifen  since 01/2012, Patient is postmenopausal, she prefers to stay on tamoxifen  instead of switching to aromatase inhibitor.   S/p bilateral risk reduction mastectomy, no need for mammogram and MRI breast. We discussed about the minimal benefit of tamoxifen  use beyond 10 years, especially after bilateral mastectomy.  She prefers to continue Tamoxifen  treatment.  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  Chest wall mass Previous ultrasound showed benign-appearing cystic lesion on her chest wall.  Size remained stable on repeat ultrasound. will refer patient to surgery for evaluation of excisional biopsy.   Family history of pancreatic cancer CA 19.9 elevated slightly, non specific Her father was diagnosed with pancreatic cancer [although father is negative for BRCA2 mutation] She is interested in EUS screening. She continues to follow up with [facility] high risk program and is on protocol of MRI abdomen alternate with EUS every 6 months.    Nonalcoholic steatohepatitis (NASH) Chronically elevated LFT, monitor.  Previous liver biopsy on 11/05/2019 revealed benign hepatic parenchyma with moderate macrovesicular steatosis, mild portal inflammation with focal lobular activity, and focal ballooning degeneration. There was no evidence of malignancy. Follow up with  GI  Osteopenia 04/20/2020 DEXA right femur T score -1.7 Continue calcium and vitamin D supplementation.   Orders Placed This Encounter  Procedures   Cancer antigen 15-3    Standing Status:   Future    Expected Date:   06/17/2023    Expiration Date:   06/13/2024   Cancer antigen 27.29    Standing Status:   Future    Expected Date:   06/17/2023    Expiration Date:   06/13/2024   CBC with Differential (Cancer Center Only)    Standing Status:   Future    Expected Date:   06/17/2023    Expiration Date:   06/13/2024   CMP (Cancer Center only)    Standing Status:   Future    Expected Date:   06/17/2023    Expiration Date:   06/13/2024   Cancer antigen 19-9    Standing Status:   Future    Expected Date:   06/17/2023    Expiration Date:   06/13/2024   Follow up 6 months.   All questions were answered. The patient knows to call the clinic with any problems, questions or concerns.  [name], MD, PhD [facility] Hematology Oncology 06/14/2023    PERTINENT ONCOLOGY HISTORY Patient previously followed up by Dr.Corcoran, patient switched care to me on 08/27/20 Extensive medical record review was performed by me  06/08/2011. 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  There was solid, cribriform and comedo DCIS with necrosis.  One of 5 lymph nodes were positive.  There was a macro-metastasis in one intramammary lymph node.  Tumor was ER positive (88.8%), PR positive (90.93%), and Her2/neu 3+.  Ki-67 was 48%.   Pathologic stage was T2N1aM0.    Adjuvant chemotherapy  6 cycles of TCH chemotherapy beginning 07/11/2011.  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  She completed 1 year of adjuvant Herceptin.  per Dr.Corcoran's note  her EF decreased (no records available).  EF returned to normal per patient report (last  echo was in 2014).   Adjuvant radiation (completed before the end of 2013).     01/2012 Tamoxifen    My Risk Genetic testing on 07/13/2015 revealed a BRCA2 mutation (c.5946del). 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   Patient has been on breast MRI alternate with mammogram every 6 months.   # she had TAH- BSO  # Patient follows up with gastroenterology, and had colonoscopy and EGD in 2019.  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 Debrah Fan.  # She was diagnosed with early stage Parkinson's disease.  Tremors have improved on Sinemet .  Paraneoplastic antibody panel was negative on 08/11/2017.  She has been followed by Dr. Walden Guise, neurologist.  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  # She has a history of lymphocytosis.  Flow cytometry on 08/05/2019 revealed no diagnostic immunophenotypic abnormality. There was absolute lymphocytosis. B-cell clonality was unable to be performed secondary to nonspecific light chain binding. Consider B cell gene rearrangement studies.  08/16/2021, bilateral screening mammogram negative for malignancy.   05/09/2022 s/p bilateral mastectomy, negative for malignancy  INTERVAL HISTORY Kelly Munoz is a 63 y.o. female who has above history reviewed by me today presents for follow up for evaluation of left chest wall lump.  She has hot flash and chronic sweating episodes.  Patient takes tamoxifen  20 mg daily.   Patient tolerates well with manageable side effect She has Parkinson's disease, symptoms are stable.  11/18/2022 patient noticed a small lump on the left upper anterior chest wall. Lump is slightly tender. No recent injury/wound infection. No fever, chills.  11/202/205 US  chest soft tissue showed 1.6 x 1.2 cm benign appearing left chest wall mass.  02/13/2023 repeat US  showed Stable complex lesion within the  left chest wall. No significant change from prior imaging dating back to November 2024. Patient reports that she has not noticed any size chang of this mass.   Review of Systems  Constitutional:  Negative for appetite change, chills, fatigue and fever.  HENT:   Negative for hearing loss and voice change.   Eyes:  Negative for eye problems.  Respiratory:  Negative for chest tightness and cough.   Cardiovascular:  Negative for chest pain.  Gastrointestinal:  Negative for abdominal distention, abdominal pain and blood in stool.  Endocrine: Positive for hot flashes.  Genitourinary:  Negative for difficulty urinating and frequency.   Musculoskeletal:  Positive for arthralgias.  Skin:  Negative for itching and rash.  Neurological:  Negative for extremity weakness.  Hematological:  Negative for adenopathy.  Psychiatric/Behavioral:  Negative for confusion.     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    Breast cancer (HCC)    Breast cancer, right (HCC) 06/08/2011   Crohn's disease (HCC)    Family history of breast cancer    Family history of  colon cancer    Family history of prostate cancer    Heart murmur    History of IBS    History of kidney stones    Hypertension    Lymphocytosis 08/19/2019   Mixed hyperlipidemia    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(HCC)    Nonalcoholic steatohepatitis (NASH)    Parkinson disease (HCC) 2020   Parkinsonism (HCC)    Personal history of chemotherapy 2013   RIGHT lumpectomy   Personal history of radiation therapy 2013   RIGHT lumpectomy   PONV (postoperative nausea and vomiting)    nausea   Raynaud's disease    Skin cancer     Past Surgical History:  Procedure Laterality Date   ABDOMINAL HYSTERECTOMY Bilateral    oophorectomy   BREAST BIOPSY Right 2013   lumpectomy chem and rad   BREAST LUMPECTOMY Right 2013   COLONOSCOPY WITH  PROPOFOL  N/A 02/06/2017   Procedure: COLONOSCOPY WITH PROPOFOL ;  Surgeon: Deveron Fly, MD;  Location: [facility] ENDOSCOPY;  Service: Endoscopy;  Laterality: N/A;   COLONOSCOPY WITH PROPOFOL  N/A 08/02/2021   Procedure: COLONOSCOPY WITH PROPOFOL ;  Surgeon: Selena Daily, MD;  Location: [facility] ENDOSCOPY;  Service: Gastroenterology;  Laterality: N/A;   CYSTOSCOPY N/A 11/09/2015   Procedure: CYSTOSCOPY;  Surgeon: Margarie Shay Ward, MD;  Location: [facility] ORS;  Service: Gynecology;  Laterality: N/A;   parotid stone      Removal   parotid stones Left    PRIMARY CLOSURE Bilateral 05/09/2022   Procedure: ADVANCED CLOSURE;  Surgeon: Thornell Flirt, DO;  Location: [facility] ORS;  Service: Plastics;  Laterality: Bilateral;   REDUCTION MAMMAPLASTY Bilateral 02/2013   TONSILLECTOMY     TOTAL MASTECTOMY Bilateral 05/09/2022   Procedure: TOTAL MASTECTOMY;  Surgeon: Eldred Grego, MD;  Location: [facility] ORS;  Service: General;  Laterality: Bilateral;    Family History  Problem Relation Age of Onset   Breast cancer Mother 109   Kidney cancer Father        dx in his mid 61s   Prostate cancer Maternal Uncle        dx in mid 62s   Stomach cancer Paternal Aunt        dx in mid 76s   Diabetes Maternal Grandmother    Heart disease Maternal Grandmother    Prostate cancer Maternal Grandfather    Heart disease Paternal Grandmother    Heart attack Paternal Grandfather    Prostate cancer Maternal Uncle        dx in mid 53s   Prostate cancer Maternal Uncle        dx in mid 62s   Colon cancer Maternal Uncle        dx in mid 44s   Colon cancer Cousin        paternal first cousin dx in his mid 23s   Colon cancer Cousin        paternal first cousin dx in his mid 56s    Social History:  reports that she has never smoked. She has never used smokeless tobacco. She reports that she does not drink alcohol and does not use drugs. She is from [place] .  She moved to [place]  at the end of 03/2015 to be  with her family. Her parents live in [place].  She lives with her husband.  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    Allergies:  Allergies  Allergen Reactions   Cat Dander Other (See Comments)    Sneezing,watery  eyes, throat closes   Zithromax [Azithromycin] Diarrhea   Clindamycin/Lincomycin Rash   Erythromycin Rash    Current Medications: Current Outpatient Medications  Medication Sig Dispense Refill   APRISO  0.375 g 24 hr capsule TAKE 1 CAPSULE BY MOUTH IN THE  MORNING AND AT BEDTIME (Patient taking differently: 375 mg daily.) 180 capsule 3   carbidopa -levodopa  (SINEMET  IR) 25-100 MG tablet Take 2 tablets by mouth 3 (three) times daily. Take as directed.     Cholecalciferol  (VITAMIN D3) 5000 units CAPS Take 5,000 Units by mouth daily.     COVID-19 mRNA bivalent vaccine, Pfizer, (PFIZER COVID-19 VAC BIVALENT) injection Inject into the muscle. 0.3 mL 0   cyanocobalamin (VITAMIN B12) 500 MCG tablet Take 500 mcg by mouth daily.     fluticasone  (FLONASE ) 50 MCG/ACT nasal spray Place 1-2 sprays into both nostrils daily as needed for allergies or rhinitis.     metoprolol  succinate (TOPROL -XL) 25 MG 24 hr tablet Take 25 mg by mouth daily.     tamoxifen  (NOLVADEX ) 20 MG tablet TAKE 1 TABLET BY MOUTH DAILY 90 tablet 3   HYDROcodone -acetaminophen  (NORCO/VICODIN) 5-325 MG tablet Take 1 tablet by mouth every 4 (four) hours as needed for moderate pain. (Patient not taking: Reported on 06/14/2023) 30 tablet 0   lidocaine  (LIDODERM ) 5 % 1 patch as needed. (Patient not taking: Reported on 08/22/2022)     tretinoin (RETIN-A) 0.05 % cream 1 application  as needed. (Patient not taking: Reported on 06/14/2023)     No current facility-administered medications for this visit.     Performance status (ECOG):  0-1  Vitals Blood pressure (!) 143/91, pulse 90, temperature 98.8 F (37.1 C), temperature source Tympanic, resp. rate 16, weight 156 lb (70.8 kg), SpO2 98%.   Physical  Exam Constitutional:      General: She is not in acute distress.    Appearance: She is not diaphoretic.  Eyes:     General: No scleral icterus. Cardiovascular:     Rate and Rhythm: Normal rate.     Heart sounds: No murmur heard. Pulmonary:     Effort: Pulmonary effort is normal. No respiratory distress.     Breath sounds: No wheezing.  Abdominal:     General: Bowel sounds are normal. There is no distension.     Palpations: Abdomen is soft.  Musculoskeletal:        General: Normal range of motion.     Cervical back: Normal range of motion.  Skin:    General: Skin is warm and dry.  Neurological:     Mental Status: She is alert and oriented to person, place, and time. Mental status is at baseline.     Cranial Nerves: No cranial nerve deficit.     Motor: No abnormal muscle tone.  Psychiatric:        Mood and Affect: Mood and affect normal.   Breast exam is performed in seated and lying down position.  Patient is status post bilateral mastectomy with well-healed surgical scars.   No palpable axillary adenopathy bilaterally.  There is a small smooth mobile lymph node like mass on the lest anterior upper chest wall, right above her removed medi port site.   RADIOGRAPHIC STUDIES: I have personally reviewed the radiological images as listed and agreed with the findings in the report. No results found.  Laboratory Studies.     Latest Ref Rng & Units 06/14/2023   10:35 AM 11/23/2022    8:14 AM 08/15/2022   10:28 AM  CBC  WBC 4.0 - 10.5 K/uL 7.9  7.8  7.5   Hemoglobin 12.0 - 15.0 g/dL 30.8  65.7  84.6   Hematocrit 36.0 - 46.0 % 42.4  41.6  41.2   Platelets 150 - 400 K/uL 193  196  201       Latest Ref Rng & Units 06/14/2023   10:35 AM 11/23/2022    8:14 AM 08/15/2022   10:28 AM  CMP  Glucose 70 - 99 mg/dL 962  952  841   BUN 8 - 23 mg/dL 12  9  12    Creatinine 0.44 - 1.00 mg/dL 3.24  4.01  0.27   Sodium 135 - 145 mmol/L 138  137  136   Potassium 3.5 - 5.1 mmol/L 3.6  3.5  3.4    Chloride 98 - 111 mmol/L 103  103  104   CO2 22 - 32 mmol/L 26  26  25    Calcium 8.9 - 10.3 mg/dL 9.9  9.5  9.9   Total Protein 6.5 - 8.1 g/dL 7.0  7.2  7.0   Total Bilirubin 0.0 - 1.2 mg/dL 0.6  0.4  0.4   Alkaline Phos 38 - 126 U/L 62  60  53   AST 15 - 41 U/L 153  96  103   ALT 0 - 44 U/L 24  13  59

## 2023-06-14 NOTE — Assessment & Plan Note (Signed)
#  BRCA2 +  S/p TAH -BSO- 11/09/2015  Stable normal CA125.

## 2023-06-14 NOTE — Assessment & Plan Note (Signed)
 Previous ultrasound showed benign-appearing cystic lesion on her chest wall.  Size remained stable on repeat ultrasound. will refer patient to surgery for evaluation of excisional biopsy.

## 2023-06-14 NOTE — Assessment & Plan Note (Signed)
04/20/2020 DEXA right femur T score -1.7 Continue calcium and vitamin D supplementation.

## 2023-06-15 LAB — CANCER ANTIGEN 15-3: CA 15-3: 14.8 U/mL (ref 0.0–25.0)

## 2023-06-15 LAB — CANCER ANTIGEN 27.29: CA 27.29: 14.9 U/mL (ref 0.0–38.6)

## 2023-06-15 LAB — CANCER ANTIGEN 19-9: CA 19-9: 47 U/mL — ABNORMAL HIGH (ref 0–35)

## 2023-06-16 ENCOUNTER — Other Ambulatory Visit: Payer: Self-pay | Admitting: General Surgery

## 2023-06-16 DIAGNOSIS — R222 Localized swelling, mass and lump, trunk: Secondary | ICD-10-CM

## 2023-06-19 ENCOUNTER — Ambulatory Visit

## 2023-06-19 DIAGNOSIS — R41841 Cognitive communication deficit: Secondary | ICD-10-CM

## 2023-06-19 DIAGNOSIS — R471 Dysarthria and anarthria: Secondary | ICD-10-CM

## 2023-06-19 DIAGNOSIS — R490 Dysphonia: Secondary | ICD-10-CM

## 2023-06-19 NOTE — Therapy (Signed)
 OUTPATIENT SPEECH LANGUAGE PATHOLOGY PARKINSON'S TREATMENT   Patient Name: Kelly Munoz MRN: [id number] DOB:02-27-1960, 63 y.o., female Today's Date: 06/19/2023  PCP: Overton Blotter, MD REFERRING PROVIDER: Blanchard Bunk, PA   End of Session - 06/19/23 1406     Visit Number 4    Number of Visits 24    Date for SLP Re-Evaluation 08/30/23    SLP Start Time 1406    SLP Stop Time  1450    SLP Time Calculation (min) 44 min    Activity Tolerance Patient tolerated treatment well             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    Breast cancer (HCC)    Breast cancer, right (HCC) 06/08/2011   Crohn's disease (HCC)    Family history of breast cancer    Family history of colon cancer    Family history of prostate cancer    Heart murmur    History of IBS    History of kidney stones    Hypertension    Lymphocytosis 08/19/2019   Mixed hyperlipidemia    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(HCC)    Nonalcoholic steatohepatitis (NASH)    Parkinson disease (HCC) 2020   Parkinsonism (HCC)    Personal history of chemotherapy 2013   RIGHT lumpectomy   Personal history of radiation therapy 2013   RIGHT lumpectomy   PONV (postoperative nausea and vomiting)    nausea   Raynaud's disease    Skin cancer    Past Surgical History:  Procedure Laterality Date   ABDOMINAL HYSTERECTOMY Bilateral    oophorectomy   BREAST BIOPSY Right 2013   lumpectomy chem and rad   BREAST LUMPECTOMY Right 2013   COLONOSCOPY WITH PROPOFOL  N/A 02/06/2017   Procedure: COLONOSCOPY WITH PROPOFOL ;  Surgeon: Deveron Fly, MD;  Location: [facility] ENDOSCOPY;  Service: Endoscopy;  Laterality: N/A;   COLONOSCOPY WITH PROPOFOL  N/A 08/02/2021   Procedure: COLONOSCOPY WITH PROPOFOL ;  Surgeon: Selena Daily, MD;  Location: [facility] ENDOSCOPY;  Service: Gastroenterology;  Laterality: N/A;    CYSTOSCOPY N/A 11/09/2015   Procedure: CYSTOSCOPY;  Surgeon: Margarie Shay Ward, MD;  Location: [facility] ORS;  Service: Gynecology;  Laterality: N/A;   parotid stone      Removal   parotid stones Left    PRIMARY CLOSURE Bilateral 05/09/2022   Procedure: ADVANCED CLOSURE;  Surgeon: Thornell Flirt, DO;  Location: [facility] ORS;  Service: Plastics;  Laterality: Bilateral;   REDUCTION MAMMAPLASTY Bilateral 02/2013   TONSILLECTOMY     TOTAL MASTECTOMY Bilateral 05/09/2022   Procedure: TOTAL MASTECTOMY;  Surgeon: Eldred Grego, MD;  Location: [facility] ORS;  Service: General;  Laterality: Bilateral;   Patient Active Problem List   Diagnosis Date Noted   Chest wall mass 11/23/2022   BRCA gene mutation positive 05/09/2022   Family history of pancreatic cancer 01/27/2022   Indeterminate colitis    Goals of care, counseling/discussion 08/19/2019   History of fatty infiltration of liver 09/18/2018   Parkinsonism (HCC) 09/18/2018   Tremor 08/10/2017   Hypercalcemia 02/15/2017   Intermittent vertigo 11/05/2016   Nonalcoholic steatohepatitis (NASH) 11/01/2016   Osteopenia 02/14/2016   Family history of breast cancer    Family history of colon cancer    Family history of prostate cancer    History of breast cancer 09/30/2015   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 09/30/2015   Crohn's disease (HCC) 09/30/2015  Mixed hyperlipidemia 09/30/2015   Cancer of right breast with BRCA2 gene mutation (HCC) 09/09/2015   Benign essential hypertension 08/27/2014   Disequilibrium 08/27/2014   Breast cancer, right (HCC) 06/08/2011    ONSET DATE: 05/18/23 (referral date); diagnosed with Parkinson's in 2020  REFERRING DIAG: Parkinson's disease without dyskinesia, without fluctuating manifestations; hypophonia  THERAPY DIAG:  Cognitive communication deficits  Hypophonia with hoarseness  Dysarthria and anarthria  Rationale for Evaluation and Treatment Rehabilitation  SUBJECTIVE:   SUBJECTIVE STATEMENT: Pt alert,  pleasant, and cooperative.  Pt accompanied by: self  PERTINENT HISTORY: Per Neurology, "Per Neurology note, 01/31/23, "Initial History of Present Illness:[name] is a 63 y.o. right handed female who is seen today for evaluation of the above problem. She has a past medical history of BRCA gene mutation positive in female, Breast cancer (CMS/HHS-HCC) (2013), Calculus of parotid gland, Colon polyp, Crohn's disease (CMS/HHS-HCC), Hypertension (2011), IBS (irritable bowel syndrome), Inflammatory bowel disease, Kidney stones (1987), Motion sickness, Parkinsonism (CMS/HHS-HCC), PONV (postoperative nausea and vomiting), and Reynolds syndrome (CMS/HHS-HCC)... Consider Speech Therapy for word finding difficulties."  DIAGNOSTIC FINDINGS: MRI brain, "1. No acute intracranial abnormality identified. 2. Nonspecific T2 FLAIR hyperintense white matter foci probably represent microvascular ischemic changes particularly in the setting of diabetes or hypertension with some differential considerations including migraine headache or sequelae of prior infectious/inflammatory process. No specific findings of demyelination. 3. 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 No  FALLS: Has patient fallen in last 6 months?  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 Continued education completed via rationale as well as changes to speech in setting of Parkinsonism.   Pt completed the following with min/mod cues Sustained /a/: x10, 84 dB Ascending pitch glides x10, 84 dB Descending pitch glides: x10, 85 db    Pt read x10 functional phrases twice through with loud and intentional voice, averaging 84 dB.   Pt benefited from use of biofeeback using Voice Analyst app.    Reviewed  EMST for improved breath support. Pt completed 5 sets of 5 breaths at 55cmH20.  Reviewed semantic features analysis for improved wordfinding. Pt utlized during structured task with no more than min cueing.   PATIENT EDUCATION: Education details: as above Person educated: Patient Education method: Explanation Education comprehension: verbalized understanding; needs reinforcement   HOME EXERCISE PROGRAM: Intensity based HEP EMST   GOALS: Goals reviewed with patient? Yes  SHORT TERM GOALS: Target date: 10 sessions  With Min A, patient will complete a semantic feature analysis with at least 4 relevant features for 4/5 target words to improve word-finding skills.  Baseline: Goal status: INITIAL  2.  With Min A, pt will complete 3/3 vocal ex's (sustained phonation, pitch glides, phrases) at with an average of ~85 dB SPL to improve voice loudness and clarity.   Baseline:  Goal status: INITIAL  3.  To improve carry-over and establish a home exercise program, the patient will complete recommended home exercises 5/7 days per week per patient log.  Baseline:  Goal status: INITIAL  4.   Pt will ID x3 strategies to promote functional attention and memory with min cues.  Baseline:  Goal status: INITIAL    LONG TERM GOALS: Target date: 12 weeks  Pt will use compensatory strategies for anomia with rare cues at the conversation level.  Baseline:  Goal status: INITIAL  2.  Pt will be indep in HEP for voice/dysarthria.  Baseline:  Goal status: INITIAL  3.  Pt will report improved communication via PROM and/or subjective reports. Baseline: CES 20/32 Goal status: INITIAL    ASSESSMENT:  CLINICAL IMPRESSION: Patient is a 64 y.o. female  who was seen today for cognitive-communication treatment in setting  of Parkinsonism. Pt with reports of wordfinding difficulty and changes to voice (hypophonia, hoarseness) which are impacting pt socially. Pt reports worsening s/sx at end of the day or when she is "tired." Pt also endorsed changes to swallowing including intermittent globus sensation and coughing like "something went down the wrong way." Pt politely declines instrumental evaluation of swallowing at this time. Initial assessment completed via informal means, acoustic measurements, formal assessment (ACE-III), and PROM (CES). Based on initial assessment, pt presents with mild cognitive-communication difficulty affecting memory, verbal fluency, and visuospatial abilities. Suspect pt with higher level attention deficits not appreciated well on today's assessment given reports of losing train of thought. Pt also with mild hypokinetic dysarthria c/b perceptual and acoustic features of hoarseness, hypophonia, and lower than average pitch. Recommend course of ST to address the above deficits.   OBJECTIVE IMPAIRMENTS include memory, expressive language, and dysarthria. These impairments are limiting patient from effectively communicating at home and in community. Factors affecting potential to achieve goals and functional outcome are co-morbidities. Patient will benefit from skilled SLP services to address above impairments and improve overall function.  REHAB POTENTIAL: Good  PLAN: SLP FREQUENCY: 1-2x/week  SLP DURATION: 12 weeks  PLANNED INTERVENTIONS: Language facilitation, Cueing hierachy, Internal/external aids, Functional tasks, SLP instruction and feedback, Compensatory strategies, and Patient/family education   Dia Forget, M.S., CCC-SLP Speech-Language Pathologist [place] [facility] [phone] [place] Clayman)    [facility] at [facility] [address] [place], [place], [address] Phone: [phone]   Fax:   [phone]

## 2023-06-20 ENCOUNTER — Ambulatory Visit: Payer: Self-pay | Admitting: Oncology

## 2023-06-20 DIAGNOSIS — R978 Other abnormal tumor markers: Secondary | ICD-10-CM

## 2023-06-20 DIAGNOSIS — Z1501 Genetic susceptibility to malignant neoplasm of breast: Secondary | ICD-10-CM

## 2023-06-20 NOTE — Telephone Encounter (Signed)
-----   Message from [name] sent at 06/20/2023 12:05 AM EDT ----- Elevated CA 19.9, BRCA2 mutation . please arrange pt to get MRI abdomen MRCP w wo contrast

## 2023-06-20 NOTE — Progress Notes (Signed)
 CA19.9 results faxed to [facility] @ [phone]

## 2023-06-20 NOTE — Telephone Encounter (Signed)
 Please schedule MRI Abdomen MRCP and notify pt of appt details.

## 2023-06-21 ENCOUNTER — Ambulatory Visit

## 2023-06-21 DIAGNOSIS — R490 Dysphonia: Secondary | ICD-10-CM

## 2023-06-21 DIAGNOSIS — R41841 Cognitive communication deficit: Secondary | ICD-10-CM | POA: Diagnosis not present

## 2023-06-21 DIAGNOSIS — R471 Dysarthria and anarthria: Secondary | ICD-10-CM

## 2023-06-21 NOTE — Therapy (Addendum)
 OUTPATIENT SPEECH LANGUAGE PATHOLOGY PARKINSON'S TREATMENT   Patient Name: Kelly Munoz MRN: [id number] DOB:April 08, 1960, 63 y.o., female Today's Date: 06/21/2023  PCP: Overton Blotter, MD REFERRING PROVIDER: Blanchard Bunk, PA   End of Session - 06/21/23 1311     Visit Number 5    Number of Visits 24    Date for SLP Re-Evaluation 08/30/23    SLP Start Time 1315    SLP Stop Time  1400    SLP Time Calculation (min) 45 min    Activity Tolerance Patient tolerated treatment well             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    Breast cancer (HCC)    Breast cancer, right (HCC) 06/08/2011   Crohn's disease (HCC)    Family history of breast cancer    Family history of colon cancer    Family history of prostate cancer    Heart murmur    History of IBS    History of kidney stones    Hypertension    Lymphocytosis 08/19/2019   Mixed hyperlipidemia    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(HCC)    Nonalcoholic steatohepatitis (NASH)    Parkinson disease (HCC) 2020   Parkinsonism (HCC)    Personal history of chemotherapy 2013   RIGHT lumpectomy   Personal history of radiation therapy 2013   RIGHT lumpectomy   PONV (postoperative nausea and vomiting)    nausea   Raynaud's disease    Skin cancer    Past Surgical History:  Procedure Laterality Date   ABDOMINAL HYSTERECTOMY Bilateral    oophorectomy   BREAST BIOPSY Right 2013   lumpectomy chem and rad   BREAST LUMPECTOMY Right 2013   COLONOSCOPY WITH PROPOFOL  N/A 02/06/2017   Procedure: COLONOSCOPY WITH PROPOFOL ;  Surgeon: Deveron Fly, MD;  Location: [facility] ENDOSCOPY;  Service: Endoscopy;  Laterality: N/A;   COLONOSCOPY WITH PROPOFOL  N/A 08/02/2021   Procedure: COLONOSCOPY WITH PROPOFOL ;  Surgeon: Selena Daily, MD;  Location: [facility] ENDOSCOPY;  Service: Gastroenterology;  Laterality: N/A;    CYSTOSCOPY N/A 11/09/2015   Procedure: CYSTOSCOPY;  Surgeon: Margarie Shay Ward, MD;  Location: [facility] ORS;  Service: Gynecology;  Laterality: N/A;   parotid stone      Removal   parotid stones Left    PRIMARY CLOSURE Bilateral 05/09/2022   Procedure: ADVANCED CLOSURE;  Surgeon: Thornell Flirt, DO;  Location: [facility] ORS;  Service: Plastics;  Laterality: Bilateral;   REDUCTION MAMMAPLASTY Bilateral 02/2013   TONSILLECTOMY     TOTAL MASTECTOMY Bilateral 05/09/2022   Procedure: TOTAL MASTECTOMY;  Surgeon: Eldred Grego, MD;  Location: [facility] ORS;  Service: General;  Laterality: Bilateral;   Patient Active Problem List   Diagnosis Date Noted   Chest wall mass 11/23/2022   BRCA gene mutation positive 05/09/2022   Family history of pancreatic cancer 01/27/2022   Indeterminate colitis    Goals of care, counseling/discussion 08/19/2019   History of fatty infiltration of liver 09/18/2018   Parkinsonism (HCC) 09/18/2018   Tremor 08/10/2017   Hypercalcemia 02/15/2017   Intermittent vertigo 11/05/2016   Nonalcoholic steatohepatitis (NASH) 11/01/2016   Osteopenia 02/14/2016   Family history of breast cancer    Family history of colon cancer    Family history of prostate cancer    History of breast cancer 09/30/2015   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 09/30/2015   Crohn's disease (HCC) 09/30/2015  Mixed hyperlipidemia 09/30/2015   Cancer of right breast with BRCA2 gene mutation (HCC) 09/09/2015   Benign essential hypertension 08/27/2014   Disequilibrium 08/27/2014   Breast cancer, right (HCC) 06/08/2011    ONSET DATE: 05/18/23 (referral date); diagnosed with Parkinson's in 2020  REFERRING DIAG: Parkinson's disease without dyskinesia, without fluctuating manifestations; hypophonia  THERAPY DIAG:  Cognitive communication deficits  Hypophonia with hoarseness  Dysarthria and anarthria  Rationale for Evaluation and Treatment Rehabilitation  SUBJECTIVE:   SUBJECTIVE STATEMENT: Pt alert,  pleasant, and cooperative.  Pt accompanied by: self  PERTINENT HISTORY: Per Neurology, Per Neurology note, 01/31/23, Initial History of Present Illness:Ashwika Lober is a 63 y.o. right handed female who is seen today for evaluation of the above problem. She has a past medical history of BRCA gene mutation positive in female, Breast cancer (CMS/HHS-HCC) (2013), Calculus of parotid gland, Colon polyp, Crohn's disease (CMS/HHS-HCC), Hypertension (2011), IBS (irritable bowel syndrome), Inflammatory bowel disease, Kidney stones (1987), Motion sickness, Parkinsonism (CMS/HHS-HCC), PONV (postoperative nausea and vomiting), and Reynolds syndrome (CMS/HHS-HCC)... Consider Speech Therapy for word finding difficulties.  DIAGNOSTIC FINDINGS: MRI brain, 1. No acute intracranial abnormality identified. 2. Nonspecific T2 FLAIR hyperintense white matter foci probably represent microvascular ischemic changes particularly in the setting of diabetes or hypertension with some differential considerations including migraine headache or sequelae of prior infectious/inflammatory process. No specific findings of demyelination. 3. 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  PAIN:  Are you having pain? No  FALLS: Has patient fallen in last 6 months?  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 Continued education completed via rationale as well as changes to speech in setting of Parkinsonism.   Pt completed the following with min cues Sustained /a/: x10, 85 dB Deferred ascending and descending pitch glides as pt endorsed feeling  uncomfortable completing in SLP's office. Pt will continue to complete via HEP.   Pt read x10 functional phrases twice through with loud and intentional voice, averaging 83 dB.   Pt benefited from use of biofeeback using Voice Analyst app.    Reviewed EMST for improved breath support. Pt completed 5 sets of 5 breaths at 55cmH20.  Education completed re: changes to attention, memory, executive functioning, and wordfinding in Parkinsonism. Pt keenly aware of CLOF and tearful at times. Supportive counseling provided as appropriate. Reviewed compensations for anomia. No overt wordfinding difficulty noted.   PATIENT EDUCATION: Education details: as above Person educated: Patient Education method: Explanation Education comprehension: verbalized understanding; needs reinforcement   HOME EXERCISE PROGRAM: Intensity based HEP EMST   GOALS: Goals reviewed with patient? Yes  SHORT TERM GOALS: Target date: 10 sessions  With Min A, patient will complete a semantic feature analysis with at least 4 relevant features for 4/5 target words to improve word-finding skills.  Baseline: Goal status: INITIAL  2.  With Min A, pt will complete 3/3 vocal ex's (sustained phonation, pitch glides, phrases) at with an average of ~85 dB SPL to improve voice loudness and clarity.   Baseline:  Goal status: INITIAL  3.  To improve carry-over and establish a home exercise program, the patient will complete recommended home exercises 5/7 days per week per patient log.  Baseline:  Goal status: INITIAL  4.   Pt will ID x3 strategies to promote functional attention and memory with min cues.  Baseline:  Goal status: INITIAL    LONG TERM GOALS: Target date: 12 weeks  Pt will use compensatory strategies for anomia with rare cues at the conversation level.  Baseline:  Goal status: INITIAL  2.  Pt will be indep in HEP for voice/dysarthria.  Baseline:  Goal status: INITIAL  3.  Pt will report improved  communication via PROM and/or subjective reports. Baseline: CES 20/32 Goal status: INITIAL    ASSESSMENT:  CLINICAL IMPRESSION: Patient is a 63 y.o. female  who was seen today for cognitive-communication treatment in setting of Parkinsonism. Pt with reports of wordfinding difficulty and changes to voice (hypophonia, hoarseness) which are impacting pt socially. Pt reports worsening s/sx at end of the day or when she is tired. Pt also endorsed changes to swallowing including intermittent globus sensation and coughing like something went down the wrong way. Pt politely declines instrumental evaluation of swallowing at this time. Initial assessment completed via informal means, acoustic measurements, formal assessment (ACE-III), and PROM (CES). Based on initial assessment, pt presents with mild cognitive-communication difficulty affecting memory, verbal fluency, and visuospatial abilities. Suspect pt with higher level attention deficits not appreciated well on today's assessment given reports of losing train of thought. Pt also with mild hypokinetic dysarthria c/b perceptual and acoustic features of hoarseness, hypophonia, and lower than average pitch. Recommend course of ST to address the above deficits.   OBJECTIVE IMPAIRMENTS include memory, expressive language, and dysarthria. These impairments are limiting patient from effectively communicating at home and in community. Factors affecting potential to achieve goals and functional outcome are co-morbidities. Patient will benefit from skilled SLP services to address above impairments and improve overall function.  REHAB POTENTIAL: Good  PLAN: SLP FREQUENCY: 1-2x/week  SLP DURATION: 12 weeks  PLANNED INTERVENTIONS: Language facilitation, Cueing hierachy, Internal/external aids, Functional tasks, SLP instruction and feedback, Compensatory strategies, and Patient/family education   Dia Forget, M.S., CCC-SLP Speech-Language  Pathologist [place] [facility] [phone] [place])   [place] [facility] at [facility] [address] [place], [place], [address] Phone: [phone]   Fax:  [phone]

## 2023-06-27 ENCOUNTER — Encounter: Payer: Self-pay | Admitting: Urgent Care

## 2023-06-27 ENCOUNTER — Ambulatory Visit
Admission: RE | Admit: 2023-06-27 | Discharge: 2023-06-27 | Disposition: A | Discharge status: Home / Self Care | Source: Ambulatory Visit | Attending: General Surgery | Admitting: General Surgery

## 2023-06-27 ENCOUNTER — Ambulatory Visit

## 2023-06-27 DIAGNOSIS — R222 Localized swelling, mass and lump, trunk: Secondary | ICD-10-CM

## 2023-06-27 DIAGNOSIS — R41841 Cognitive communication deficit: Secondary | ICD-10-CM

## 2023-06-27 DIAGNOSIS — N6032 Fibrosclerosis of left breast: Secondary | ICD-10-CM | POA: Insufficient documentation

## 2023-06-27 DIAGNOSIS — R490 Dysphonia: Secondary | ICD-10-CM

## 2023-06-27 DIAGNOSIS — R471 Dysarthria and anarthria: Secondary | ICD-10-CM

## 2023-06-27 HISTORY — PX: BREAST BIOPSY: SHX20

## 2023-06-27 MED ORDER — LIDOCAINE 1 % OPTIME INJ - NO CHARGE
2.0000 mL | Freq: Once | INTRAMUSCULAR | Status: AC
  Administered 2023-06-27: 2 mL
  Filled 2023-06-27: qty 2

## 2023-06-27 MED ORDER — LIDOCAINE-EPINEPHRINE 1 %-1:100000 IJ SOLN
8.0000 mL | Freq: Once | INTRAMUSCULAR | Status: AC
  Administered 2023-06-27: 8 mL
  Filled 2023-06-27: qty 8

## 2023-06-27 NOTE — Therapy (Signed)
 OUTPATIENT SPEECH LANGUAGE PATHOLOGY P[facility]'S TREATMENT   Patient Name: Kelly Munoz MRN: [id number] DOB:08-12-1960, 63 y.o., female Today's Date: 06/27/2023  PCP: Overton Blotter, MD REFERRING PROVIDER: Blanchard Bunk, PA   End of Session - 06/27/23 0835     Visit Number 6    Number of Visits 24    Date for SLP Re-Evaluation 08/30/23    SLP Start Time 0835    SLP Stop Time  0920    SLP Time Calculation (min) 45 min    Activity Tolerance Patient tolerated treatment well          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    Breast cancer (HCC)    Breast cancer, right (HCC) 06/08/2011   Crohn's disease (HCC)    Family history of breast cancer    Family history of colon cancer    Family history of prostate cancer    Heart murmur    History of IBS    History of kidney stones    Hypertension    Lymphocytosis 08/19/2019   Mixed hyperlipidemia    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(HCC)    Nonalcoholic steatohepatitis (NASH)    Parkinson disease (HCC) 2020   Parkinsonism (HCC)    Personal history of chemotherapy 2013   RIGHT lumpectomy   Personal history of radiation therapy 2013   RIGHT lumpectomy   PONV (postoperative nausea and vomiting)    nausea   Raynaud's disease    Skin cancer    Past Surgical History:  Procedure Laterality Date   ABDOMINAL HYSTERECTOMY Bilateral    oophorectomy   BREAST BIOPSY Right 2013   lumpectomy chem and rad   BREAST LUMPECTOMY Right 2013   COLONOSCOPY WITH PROPOFOL  N/A 02/06/2017   Procedure: COLONOSCOPY WITH PROPOFOL ;  Surgeon: Deveron Fly, MD;  Location: [facility] ENDOSCOPY;  Service: Endoscopy;  Laterality: N/A;   COLONOSCOPY WITH PROPOFOL  N/A 08/02/2021   Procedure: COLONOSCOPY WITH PROPOFOL ;  Surgeon: Selena Daily, MD;  Location: [facility] ENDOSCOPY;  Service: Gastroenterology;  Laterality: N/A;   CYSTOSCOPY  N/A 11/09/2015   Procedure: CYSTOSCOPY;  Surgeon: Margarie Shay Ward, MD;  Location: [facility] ORS;  Service: Gynecology;  Laterality: N/A;   parotid stone      Removal   parotid stones Left    PRIMARY CLOSURE Bilateral 05/09/2022   Procedure: ADVANCED CLOSURE;  Surgeon: Thornell Flirt, DO;  Location: [facility] ORS;  Service: Plastics;  Laterality: Bilateral;   REDUCTION MAMMAPLASTY Bilateral 02/2013   TONSILLECTOMY     TOTAL MASTECTOMY Bilateral 05/09/2022   Procedure: TOTAL MASTECTOMY;  Surgeon: Eldred Grego, MD;  Location: [facility] ORS;  Service: General;  Laterality: Bilateral;   Patient Active Problem List   Diagnosis Date Noted   Chest wall mass 11/23/2022   BRCA gene mutation positive 05/09/2022   Family history of pancreatic cancer 01/27/2022   Indeterminate colitis    Goals of care, counseling/discussion 08/19/2019   History of fatty infiltration of liver 09/18/2018   Parkinsonism (HCC) 09/18/2018   Tremor 08/10/2017   Hypercalcemia 02/15/2017   Intermittent vertigo 11/05/2016   Nonalcoholic steatohepatitis (NASH) 11/01/2016   Osteopenia 02/14/2016   Family history of breast cancer    Family history of colon cancer    Family history of prostate cancer    History of breast cancer 09/30/2015   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 09/30/2015   Crohn's disease (HCC) 09/30/2015   Mixed  hyperlipidemia 09/30/2015   Cancer of right breast with BRCA2 gene mutation (HCC) 09/09/2015   Benign essential hypertension 08/27/2014   Disequilibrium 08/27/2014   Breast cancer, right (HCC) 06/08/2011    ONSET DATE: 05/18/23 (referral date); diagnosed with Parkinson's in 2020  REFERRING DIAG: Parkinson's disease without dyskinesia, without fluctuating manifestations; hypophonia  THERAPY DIAG:  Cognitive communication deficits  Hypophonia with hoarseness  Dysarthria and anarthria  Rationale for Evaluation and Treatment Rehabilitation  SUBJECTIVE:   SUBJECTIVE STATEMENT: Pt alert, pleasant, and  cooperative.  Pt accompanied by: self  PERTINENT HISTORY: Per Neurology, Per Neurology note, 01/31/23, Initial History of Present Illness:Kelly Munoz is a 63 y.o. right handed female who is seen today for evaluation of the above problem. She has a past medical history of BRCA gene mutation positive in female, Breast cancer (CMS/HHS-HCC) (2013), Calculus of parotid gland, Colon polyp, Crohn's disease (CMS/HHS-HCC), Hypertension (2011), IBS (irritable bowel syndrome), Inflammatory bowel disease, Kidney stones (1987), Motion sickness, Parkinsonism (CMS/HHS-HCC), PONV (postoperative nausea and vomiting), and Reynolds syndrome (CMS/HHS-HCC)... Consider Speech Therapy for word finding difficulties.  DIAGNOSTIC FINDINGS: MRI brain, 1. No acute intracranial abnormality identified. 2. Nonspecific T2 FLAIR hyperintense white matter foci probably represent microvascular ischemic changes particularly in the setting of diabetes or hypertension with some differential considerations including migraine headache or sequelae of prior infectious/inflammatory process. No specific findings of demyelination. 3. 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  PAIN:  Are you having pain? No  FALLS: Has patient fallen in last 6 months?  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 Pt completed 5 sets of 5 breaths - increased intensity by 1/4 turn.   Education completed re: changes to attention, memory,  and wordfinding in Parkinsonism with emphasis on compensations and ways to help prevent/slow further decline. Pt keenly aware of CLOF.  Endorsed successful lunch outing with an old friend in a noisy environment. Pt reported use of extra time for wordfinding as being successful. Pt suspects she was able to maintain adequate loudness in that environment as friend did not ask her to repeat herself.  Pt given log to help reflect on wordfinding episodes including time, environment, topics, feelings, strategy use, and success of repair.    PATIENT EDUCATION: Education details: as above Person educated: Patient Education method: Explanation Education comprehension: verbalized understanding; needs reinforcement   HOME EXERCISE PROGRAM: Intensity based HEP EMST Wordfinding log   GOALS: Goals reviewed with patient? Yes  SHORT TERM GOALS: Target date: 10 sessions  With Min A, patient will complete a semantic feature analysis with at least 4 relevant features for 4/5 target words to improve word-finding skills.  Baseline: Goal status: INITIAL  2.  With Min A, pt will complete 3/3 vocal ex's (sustained phonation, pitch glides, phrases) at with an average of ~85 dB SPL to improve voice loudness and clarity.   Baseline:  Goal status: INITIAL  3.  To improve carry-over and establish a home exercise program, the patient will complete recommended home exercises 5/7 days per week per patient log.  Baseline:  Goal status: INITIAL  4.   Pt will ID x3 strategies to promote functional attention and memory with min cues.  Baseline:  Goal status: INITIAL    LONG TERM GOALS: Target date: 12 weeks  Pt will use compensatory strategies for anomia with rare cues at the conversation level.  Baseline:  Goal status: INITIAL  2.  Pt will be indep in HEP for voice/dysarthria.  Baseline:  Goal status: INITIAL  3.  Pt will report improved communication via PROM and/or subjective reports. Baseline: CES 20/32 Goal status: INITIAL    ASSESSMENT:  CLINICAL IMPRESSION: Patient is a 64 y.o. female  who was seen today for  cognitive-communication treatment in setting of Parkinsonism. Pt with reports of wordfinding difficulty and changes to voice (hypophonia, hoarseness) which are impacting pt socially. Pt reports worsening s/sx at end of the day or when she is tired. Pt also endorsed changes to swallowing including intermittent globus sensation and coughing like something went down the wrong way. Pt politely declines instrumental evaluation of swallowing at this time. Initial assessment completed via informal means, acoustic measurements, formal assessment (ACE-III), and PROM (CES). Based on initial assessment, pt presents with mild cognitive-communication difficulty affecting memory, verbal fluency, and visuospatial abilities. Suspect pt with higher level attention deficits not appreciated well on today's assessment given reports of losing train of thought. Pt also with mild hypokinetic dysarthria c/b perceptual and acoustic features of hoarseness, hypophonia, and lower than average pitch. Recommend course of ST to address the above deficits.   OBJECTIVE IMPAIRMENTS include memory, expressive language, and dysarthria. These impairments are limiting patient from effectively communicating at home and in community. Factors affecting potential to achieve goals and functional outcome are co-morbidities. Patient will benefit from skilled SLP services to address above impairments and improve overall function.  REHAB POTENTIAL: Good  PLAN: SLP FREQUENCY: 1-2x/week  SLP DURATION: 12 weeks  PLANNED INTERVENTIONS: Language facilitation, Cueing hierachy, Internal/external aids, Functional tasks, SLP instruction and feedback, Compensatory strategies, and Patient/family education   Dia Forget, M.S., CCC-SLP Speech-Language Pathologist [place] [phone] [place])    [facility] Outpatient Rehabilitation at [facility] [address] [place], [place],  [address] Phone: [phone]   Fax:  [phone]

## 2023-06-28 ENCOUNTER — Ambulatory Visit

## 2023-06-28 LAB — SURGICAL PATHOLOGY

## 2023-07-04 ENCOUNTER — Ambulatory Visit

## 2023-07-04 DIAGNOSIS — R41841 Cognitive communication deficit: Secondary | ICD-10-CM | POA: Diagnosis not present

## 2023-07-04 DIAGNOSIS — R471 Dysarthria and anarthria: Secondary | ICD-10-CM

## 2023-07-04 DIAGNOSIS — R490 Dysphonia: Secondary | ICD-10-CM

## 2023-07-04 NOTE — Therapy (Signed)
 OUTPATIENT SPEECH LANGUAGE PATHOLOGY [facility]'S TREATMENT   Patient Name: Kelly Munoz MRN: [id number] DOB:09-28-1960, 63 y.o., female Today's Date: 07/04/2023  PCP: Layman Piety, MD REFERRING PROVIDER: Bernice Kins, PA   End of Session - 07/04/23 0839     Visit Number 7    Number of Visits 24    Date for SLP Re-Evaluation 08/30/23    SLP Start Time 0845    SLP Stop Time  0930    SLP Time Calculation (min) 45 min    Activity Tolerance Patient tolerated treatment well          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    Breast cancer (HCC)    Breast cancer, right (HCC) 06/08/2011   Crohn's disease (HCC)    Family history of breast cancer    Family history of colon cancer    Family history of prostate cancer    Heart murmur    History of IBS    History of kidney stones    Hypertension    Lymphocytosis 08/19/2019   Mixed hyperlipidemia    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(HCC)    Nonalcoholic steatohepatitis (NASH)    Parkinson disease (HCC) 2020   Parkinsonism (HCC)    Personal history of chemotherapy 2013   RIGHT lumpectomy   Personal history of radiation therapy 2013   RIGHT lumpectomy   PONV (postoperative nausea and vomiting)    nausea   Raynaud's disease    Skin cancer    Past Surgical History:  Procedure Laterality Date   ABDOMINAL HYSTERECTOMY Bilateral    oophorectomy   BREAST BIOPSY Right 2013   lumpectomy chem and rad   BREAST BIOPSY Left 06/27/2023   US  LT BREAST BX W LOC DEV 1ST LESION IMG BX SPEC US  GUIDE 06/27/2023 [facility]MOGR[facility]HY   BREAST LUMPECTOMY Right 2013   COLONOSCOPY WITH PROPOFOL  N/A 02/06/2017   Procedure: COLONOSCOPY WITH PROPOFOL ;  Surgeon: Gaylyn Gladis PENNER, MD;  Location: [facility] ENDOSCOPY;  Service: Endoscopy;  Laterality: N/A;   COLONOSCOPY WITH PROPOFOL  N/A 08/02/2021   Procedure: COLONOSCOPY WITH PROPOFOL ;   Surgeon: Unk Corinn Skiff, MD;  Location: [facility] ENDOSCOPY;  Service: Gastroenterology;  Laterality: N/A;   CYSTOSCOPY N/A 11/09/2015   Procedure: CYSTOSCOPY;  Surgeon: Mitzie BROCKS Ward, MD;  Location: [facility] ORS;  Service: Gynecology;  Laterality: N/A;   parotid stone      Removal   parotid stones Left    PRIMARY CLOSURE Bilateral 05/09/2022   Procedure: ADVANCED CLOSURE;  Surgeon: Lowery Estefana RAMAN, DO;  Location: [facility] ORS;  Service: Plastics;  Laterality: Bilateral;   REDUCTION MAMMAPLASTY Bilateral 02/2013   TONSILLECTOMY     TOTAL MASTECTOMY Bilateral 05/09/2022   Procedure: TOTAL MASTECTOMY;  Surgeon: Rodolph Romano, MD;  Location: [facility] ORS;  Service: General;  Laterality: Bilateral;   Patient Active Problem List   Diagnosis Date Noted   Chest wall mass 11/23/2022   BRCA gene mutation positive 05/09/2022   Family history of pancreatic cancer 01/27/2022   Indeterminate colitis    Goals of care, counseling/discussion 08/19/2019   History of fatty infiltration of liver 09/18/2018   Parkinsonism (HCC) 09/18/2018   Tremor 08/10/2017   Hypercalcemia 02/15/2017   Intermittent vertigo 11/05/2016   Nonalcoholic steatohepatitis (NASH) 11/01/2016   Osteopenia 02/14/2016   Family history of breast cancer    Family history of colon cancer    Family history of prostate  cancer    History of breast cancer 09/30/2015   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 09/30/2015   Crohn's disease (HCC) 09/30/2015   Mixed hyperlipidemia 09/30/2015   Cancer of right breast with BRCA2 gene mutation (HCC) 09/09/2015   Benign essential hypertension 08/27/2014   Disequilibrium 08/27/2014   Breast cancer, right (HCC) 06/08/2011    ONSET DATE: 05/18/23 (referral date); diagnosed with Parkinson's in 2020  REFERRING DIAG: Parkinson's disease without dyskinesia, without fluctuating manifestations; hypophonia  THERAPY DIAG:  Cognitive communication deficits  Hypophonia with hoarseness  Dysarthria and  anarthria  Rationale for Evaluation and Treatment Rehabilitation  SUBJECTIVE:   SUBJECTIVE STATEMENT: Pt alert, pleasant, and cooperative.  Pt accompanied by: self  PERTINENT HISTORY: Per Neurology, Per Neurology note, 01/31/23, Initial History of Present Illness:Kelly Munoz is a 63 y.o. right handed female who is seen today for evaluation of the above problem. She has a past medical history of BRCA gene mutation positive in female, Breast cancer (CMS/HHS-HCC) (2013), Calculus of parotid gland, Colon polyp, Crohn's disease (CMS/HHS-HCC), Hypertension (2011), IBS (irritable bowel syndrome), Inflammatory bowel disease, Kidney stones (1987), Motion sickness, Parkinsonism (CMS/HHS-HCC), PONV (postoperative nausea and vomiting), and Reynolds syndrome (CMS/HHS-HCC)... Consider Speech Therapy for word finding difficulties.  DIAGNOSTIC FINDINGS: MRI brain, 1. No acute intracranial abnormality identified. 2. Nonspecific T2 FLAIR hyperintense white matter foci probably represent microvascular ischemic changes particularly in the setting of diabetes or hypertension with some differential considerations including migraine headache or sequelae of prior infectious/inflammatory process. No specific findings of demyelination. 3. 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  PAIN:  Are you having pain? No  FALLS: Has patient fallen in last 6 months?  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 Pt completed 5 sets of 5 breaths -  increased intensity by 1/4 turn. Pt educated on how to increase resistance in upcoming weeks as  well as to transition to maintenance. Pt also reported being indep in HEP for dysarthria.   Education completed re: changes to attention, memory,  and wordfinding in Parkinsonism with emphasis on compensations and ways to help prevent/slow further decline. Pt keenly aware of CLOF. Reviewed log of wordfinding difficulty. Reviewed strategies including pausing, circumlocution/SFA, and synonym/antonym use. Pt utilizes with extra time during conversation.   Re-administered PROM with results as follows:  The Communication Effectiveness Survey is a patient-reported outcome measure in which the patient rates their own effectiveness in different communication situations. A higher score indicates greater effectiveness.   Pt's self-rating was 23/32.   Having a conversation with a family member or friends at home. 3 Participating in conversation with strangers in a quiet place. 3 Conversing with a familiar person over the telephone. 3 Conversing with a stranger over the telephone. 3 Being part of a conversation in a noisy environment (social gathering). 3 Speaking to a friend when you are emotionally upset or you are angry. 2 Having a conversation while traveling in a car. 3 Having a conversation with someone at a distance (across a room). 3  Pt educated re: POC, progress to date, and plan to d/c from ST. Supportive counseling provided as appropriate.   PATIENT EDUCATION: Education details: as above Person educated: Patient Education  method: Explanation Education comprehension: verbalized understanding; needs reinforcement   HOME EXERCISE PROGRAM: Intensity based HEP EMST Continue to practice strategies for attention, memory, and wordfinding   GOALS: Goals reviewed with patient? Yes  SHORT TERM GOALS: Target date: 10 sessions  With Min A, patient will complete a semantic feature analysis with at least 4 relevant features for 4/5 target words to improve word-finding skills.   Baseline: Goal status: MET  2.  With Min A, pt will complete 3/3 vocal ex's (sustained phonation, pitch glides, phrases) at with an average of ~85 dB SPL to improve voice loudness and clarity.   Baseline:  Goal status: MET  3.  To improve carry-over and establish a home exercise program, the patient will complete recommended home exercises 5/7 days per week per patient log.  Baseline:  Goal status: MET  4.   Pt will ID x3 strategies to promote functional attention and memory with min cues.  Baseline:  Goal status: MET    LONG TERM GOALS: Target date: 12 weeks  Pt will use compensatory strategies for anomia with rare cues at the conversation level.  Baseline:  Goal status: MET  2.  Pt will be indep in HEP for voice/dysarthria.  Baseline:  Goal status: MET  3.  Pt will report improved communication via PROM and/or subjective reports. Baseline: CES 20/32 Goal status: MET    ASSESSMENT:  CLINICAL IMPRESSION: Patient is a 63 y.o. female  who was seen today for cognitive-communication treatment in setting of Parkinsonism. Pt has completed course of ST and has met all ST goals. Pt d/c'd from ST this datae.    PLAN: D/C ST services   Delon Bangs, M.S., CCC-SLP Speech-Language Pathologist [facility] [phone] [place])   [facility] Outpatient Rehabilitation at [facility] [address] [place], [place], [address] Phone: [phone]   Fax:  [phone]

## 2023-07-06 ENCOUNTER — Ambulatory Visit

## 2023-09-14 ENCOUNTER — Other Ambulatory Visit: Payer: Self-pay | Admitting: Oncology

## 2023-10-25 ENCOUNTER — Telehealth: Payer: Self-pay | Admitting: Pediatrics

## 2023-10-25 NOTE — Telephone Encounter (Signed)
 Hi Dr. Suzann,  This patient came in today requesting a transfer of care specifically to you.  She has an extensive history from [facility], records that you will find in her chart information.  The doctor she was seeing at [facility] is no longer seeing patients.  Please let me know how you would like to proceed.  Thank you.

## 2023-11-02 ENCOUNTER — Encounter: Payer: Self-pay | Admitting: Pediatrics

## 2023-12-14 ENCOUNTER — Inpatient Hospital Stay: Admitting: Oncology

## 2023-12-14 ENCOUNTER — Inpatient Hospital Stay: Attending: Oncology

## 2023-12-14 ENCOUNTER — Encounter: Payer: Self-pay | Admitting: Oncology

## 2023-12-14 VITALS — BP 131/81 | HR 87 | Temp 99.0°F | Resp 18 | Wt 158.2 lb

## 2023-12-14 DIAGNOSIS — C50911 Malignant neoplasm of unspecified site of right female breast: Secondary | ICD-10-CM

## 2023-12-14 DIAGNOSIS — Z923 Personal history of irradiation: Secondary | ICD-10-CM | POA: Diagnosis not present

## 2023-12-14 DIAGNOSIS — Z1731 Human epidermal growth factor receptor 2 positive status: Secondary | ICD-10-CM | POA: Insufficient documentation

## 2023-12-14 DIAGNOSIS — Z1501 Genetic susceptibility to malignant neoplasm of breast: Secondary | ICD-10-CM | POA: Insufficient documentation

## 2023-12-14 DIAGNOSIS — Z1509 Genetic susceptibility to other malignant neoplasm: Secondary | ICD-10-CM

## 2023-12-14 DIAGNOSIS — Z170 Estrogen receptor positive status [ER+]: Secondary | ICD-10-CM

## 2023-12-14 DIAGNOSIS — Z1589 Genetic susceptibility to other disease: Secondary | ICD-10-CM

## 2023-12-14 DIAGNOSIS — K7581 Nonalcoholic steatohepatitis (NASH): Secondary | ICD-10-CM | POA: Insufficient documentation

## 2023-12-14 DIAGNOSIS — M8580 Other specified disorders of bone density and structure, unspecified site: Secondary | ICD-10-CM | POA: Diagnosis not present

## 2023-12-14 DIAGNOSIS — Z1507 Genetic susceptibility to malignant neoplasm of urinary tract: Secondary | ICD-10-CM

## 2023-12-14 DIAGNOSIS — Z79899 Other long term (current) drug therapy: Secondary | ICD-10-CM | POA: Insufficient documentation

## 2023-12-14 DIAGNOSIS — Z803 Family history of malignant neoplasm of breast: Secondary | ICD-10-CM | POA: Diagnosis not present

## 2023-12-14 DIAGNOSIS — Z800 Family history of malignant neoplasm of digestive organs: Secondary | ICD-10-CM | POA: Insufficient documentation

## 2023-12-14 DIAGNOSIS — Z15068 Genetic susceptibility to other malignant neoplasm of digestive system: Secondary | ICD-10-CM

## 2023-12-14 DIAGNOSIS — Z1721 Progesterone receptor positive status: Secondary | ICD-10-CM | POA: Insufficient documentation

## 2023-12-14 DIAGNOSIS — Z79810 Long term (current) use of selective estrogen receptor modulators (SERMs): Secondary | ICD-10-CM | POA: Diagnosis not present

## 2023-12-14 DIAGNOSIS — Z9221 Personal history of antineoplastic chemotherapy: Secondary | ICD-10-CM | POA: Diagnosis not present

## 2023-12-14 DIAGNOSIS — Z148 Genetic carrier of other disease: Secondary | ICD-10-CM | POA: Diagnosis not present

## 2023-12-14 LAB — CBC WITH DIFFERENTIAL (CANCER CENTER ONLY)
Abs Immature Granulocytes: 0.02 K/uL (ref 0.00–0.07)
Basophils Absolute: 0.1 K/uL (ref 0.0–0.1)
Basophils Relative: 1 %
Eosinophils Absolute: 0.1 K/uL (ref 0.0–0.5)
Eosinophils Relative: 1 %
HCT: 41.9 % (ref 36.0–46.0)
Hemoglobin: 13.8 g/dL (ref 12.0–15.0)
Immature Granulocytes: 0 %
Lymphocytes Relative: 38 %
Lymphs Abs: 2.5 K/uL (ref 0.7–4.0)
MCH: 29.4 pg (ref 26.0–34.0)
MCHC: 32.9 g/dL (ref 30.0–36.0)
MCV: 89.1 fL (ref 80.0–100.0)
Monocytes Absolute: 0.4 K/uL (ref 0.1–1.0)
Monocytes Relative: 7 %
Neutro Abs: 3.5 K/uL (ref 1.7–7.7)
Neutrophils Relative %: 53 %
Platelet Count: 185 K/uL (ref 150–400)
RBC: 4.7 MIL/uL (ref 3.87–5.11)
RDW: 13.2 % (ref 11.5–15.5)
WBC Count: 6.5 K/uL (ref 4.0–10.5)
nRBC: 0 % (ref 0.0–0.2)

## 2023-12-14 LAB — CMP (CANCER CENTER ONLY)
ALT: 30 U/L (ref 0–44)
AST: 80 U/L — ABNORMAL HIGH (ref 15–41)
Albumin: 4.2 g/dL (ref 3.5–5.0)
Alkaline Phosphatase: 80 U/L (ref 38–126)
Anion gap: 10 (ref 5–15)
BUN: 12 mg/dL (ref 8–23)
CO2: 29 mmol/L (ref 22–32)
Calcium: 10.6 mg/dL — ABNORMAL HIGH (ref 8.9–10.3)
Chloride: 104 mmol/L (ref 98–111)
Creatinine: 0.58 mg/dL (ref 0.44–1.00)
GFR, Estimated: >60 mL/min (ref >60)
Glucose, Bld: 114 mg/dL — ABNORMAL HIGH (ref 70–99)
Potassium: 3.8 mmol/L (ref 3.5–5.1)
Sodium: 142 mmol/L (ref 135–145)
Total Bilirubin: 0.4 mg/dL (ref 0.0–1.2)
Total Protein: 6.8 g/dL (ref 6.5–8.1)

## 2023-12-14 NOTE — Assessment & Plan Note (Addendum)
 04/20/2020 DEXA right femur T score -1.7 On vitamin D supplementation. Not on calcium supplementation.

## 2023-12-14 NOTE — Assessment & Plan Note (Signed)
CA 19.9 elevated slightly, non specific Her father was diagnosed with pancreatic cancer [although father is negative for BRCA2 mutation] She is interested in EUS screening. She continues to follow up with [facility] high risk program and is on protocol of MRI abdomen alternate with EUS every 6 months.

## 2023-12-14 NOTE — Assessment & Plan Note (Addendum)
 History of Stage IIB Triple positive breast cancer - 05/2011 S/p lumpectomy followed by adjvuant chemotherapy TCH x 6 followed by adjuvant RT, finished 1 year of transtuzumab.  On tamoxifen  since 01/2012, Patient is postmenopausal, she prefers to stay on tamoxifen  instead of switching to aromatase inhibitor.   S/p bilateral risk reduction mastectomy, no need for mammogram and MRI breast. She prefers to continue Tamoxifen  treatment beyond 10 years.  Stable CA 27-29

## 2023-12-14 NOTE — Progress Notes (Signed)
 Hematology/Oncology Progress note [phone] [phone]) [phone]       Chief Complaint: stage IIB Her2/neu + right breast cancer and a BRCA2 mutation   ASSESSMENT & PLAN:   Cancer Staging  Breast cancer, right (HCC) Staging form: Breast, AJCC 7th Edition - Clinical stage from 08/11/2015: Stage IIB (T2, N1(sn), M0) - Unsigned   Breast cancer, right (HCC) History of Stage IIB Triple positive breast cancer - 05/2011 S/p lumpectomy followed by adjvuant chemotherapy TCH x 6 followed by adjuvant RT, finished 1 year of transtuzumab.  On tamoxifen  since 01/2012, Patient is postmenopausal, she prefers to stay on tamoxifen  instead of switching to aromatase inhibitor.   S/p bilateral risk reduction mastectomy, no need for mammogram and MRI breast. She prefers to continue Tamoxifen  treatment beyond 10 years.  Stable CA 27-29   Osteopenia 04/20/2020 DEXA right femur T score -1.7 On vitamin D supplementation. Not on calcium supplementation.  Family history of pancreatic cancer CA 19.9 elevated slightly, non specific Her father was diagnosed with pancreatic cancer [although father is negative for BRCA2 mutation] She is interested in EUS screening. She continues to follow up with [facility] high risk program and is on protocol of MRI abdomen alternate with EUS every 6 months.    Nonalcoholic steatohepatitis (NASH) Chronically elevated LFT, monitor.  Previous liver biopsy on 11/05/2019 revealed benign hepatic parenchyma with moderate macrovesicular steatosis, mild portal inflammation with focal lobular activity, and focal ballooning degeneration. There was no evidence of malignancy. 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  Hypercalcemia Repeat calcium and PTH in 4 weeks   Orders Placed This Encounter  Procedures   Cancer antigen 19-9    Standing Status:   Future    Expected Date:   06/13/2024    Expiration Date:   09/11/2024    Cancer antigen 15-3    Standing Status:   Future    Expected Date:   06/13/2024    Expiration Date:   09/11/2024   Cancer antigen 27.29    Standing Status:   Future    Expected Date:   06/13/2024    Expiration Date:   09/11/2024   CMP (Cancer Center only)    Standing Status:   Future    Expected Date:   06/13/2024    Expiration Date:   09/11/2024   CBC with Differential (Cancer Center Only)    Standing Status:   Future    Expected Date:   06/13/2024    Expiration Date:   09/11/2024   CA 125    Standing Status:   Future    Expected Date:   06/13/2024    Expiration Date:   09/11/2024   Parathyroid hormone,  intact and calcium    Standing Status:   Future    Expected Date:   01/11/2024    Expiration Date:   04/10/2024   Follow up 6 months.   All questions were answered. The patient knows to call the clinic with any problems, questions or concerns.  Zelphia Cap, MD, PhD [facility] Hematology Oncology 12/14/2023    PERTINENT ONCOLOGY HISTORY Patient previously followed up by Dr.Corcoran, patient switched care to me on 08/27/20 Extensive medical record review was performed by me  06/08/2011. 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  There was solid,  cribriform and comedo DCIS with necrosis.  One of 5 lymph nodes were positive.  There was a macro-metastasis in one intramammary lymph node.  Tumor was ER positive (88.8%), PR positive (90.93%), and Her2/neu 3+.  Ki-67 was 48%.   Pathologic stage was T2N1aM0.    Adjuvant chemotherapy  6 cycles of TCH chemotherapy beginning 07/11/2011.  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  She completed 1 year of adjuvant Herceptin.  per Dr.Corcoran's note  her EF decreased (no records available).  EF returned to normal per patient report (last echo was in 2014).   Adjuvant  radiation (completed before the end of 2013).     01/2012 Tamoxifen    My Risk Genetic testing on 07/13/2015 revealed a BRCA2 mutation ([id number].[id number]). 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   Patient has been on breast MRI alternate with mammogram every 6 months.   # she had TAH- BSO  # Patient follows up with gastroenterology, and had colonoscopy and EGD in 2019.  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 Bluford.  # She was diagnosed with early stage Parkinson's disease.  Tremors have improved on Sinemet .  Paraneoplastic antibody panel was negative on 08/11/2017.  She has been followed by Dr. Lane, neurologist.  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  # She has a history of lymphocytosis.  Flow cytometry on 08/05/2019 revealed no diagnostic immunophenotypic abnormality. There was absolute lymphocytosis. B-cell clonality was unable to be performed secondary to nonspecific light chain binding. Consider B cell gene rearrangement studies.  08/16/2021, bilateral screening mammogram negative for malignancy.   05/09/2022 s/p bilateral mastectomy, negative for malignancy  11/18/2022 patient noticed a small lump on the left upper anterior chest wall. Lump is slightly tender. No recent injury/wound infection. No fever, chills.  11/202/205 US  chest soft tissue showed 1.6 x 1.2 cm benign appearing left chest wall mass.  02/13/2023 repeat US  showed Stable complex lesion within the left chest wall. No significant change from prior imaging dating back to November 2024. Patient reports that she has not noticed any size chang of this mass.    INTERVAL HISTORY Kelly Munoz is a 63 y.o. female who has above history reviewed by me today presents for follow up for evaluation of left chest wall lump.  She has hot flash and chronic sweating episodes.  Patient takes  tamoxifen  20 mg daily.   Patient tolerates well with manageable side effect She has Parkinson's disease, symptoms are stable.  06/27/2023  left chest wall cyst biopsy showed scar/capsular type tissue. negative for malignancy  Review of Systems  Constitutional:  Negative for appetite change, chills, fatigue and fever.  HENT:   Negative for hearing loss and voice change.   Eyes:  Negative for eye problems.  Respiratory:  Negative for chest tightness and cough.   Cardiovascular:  Negative for chest pain.  Gastrointestinal:  Negative for abdominal distention, abdominal pain and blood in stool.  Endocrine: Positive for hot flashes.  Genitourinary:  Negative for difficulty urinating and frequency.   Musculoskeletal:  Positive for arthralgias.  Skin:  Negative for itching and rash.  Neurological:  Negative for extremity weakness.  Hematological:  Negative for adenopathy.  Psychiatric/Behavioral:  Negative for confusion.     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    Breast cancer (HCC)    Breast cancer, right (HCC) 06/08/2011   Crohn's disease (HCC)    Family history of breast cancer    Family history of colon cancer    Family history of prostate cancer    Heart murmur    History of IBS    History of kidney stones    Hypertension    Lymphocytosis 08/19/2019   Mixed hyperlipidemia    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Nonalcoholic steatohepatitis (NASH)    Parkinson disease (HCC) 2020   Parkinsonism (HCC)    Personal history of chemotherapy 2013   RIGHT lumpectomy   Personal history of radiation therapy 2013   RIGHT lumpectomy   PONV (postoperative nausea and vomiting)    nausea   Raynaud's disease    Skin cancer     Past Surgical History:  Procedure Laterality Date   ABDOMINAL HYSTERECTOMY Bilateral    oophorectomy   BREAST BIOPSY Right 2013   lumpectomy chem and  rad   BREAST BIOPSY Left 06/27/2023   US  LT BREAST BX W LOC DEV 1ST LESION IMG BX SPEC US  GUIDE 06/27/2023 [facility]   BREAST LUMPECTOMY Right 2013   COLONOSCOPY WITH PROPOFOL  N/A 02/06/2017   Procedure: COLONOSCOPY WITH PROPOFOL ;  Surgeon: Gaylyn Gladis PENNER, MD;  Location: [facility] ENDOSCOPY;  Service: Endoscopy;  Laterality: N/A;   COLONOSCOPY WITH PROPOFOL  N/A 08/02/2021   Procedure: COLONOSCOPY WITH PROPOFOL ;  Surgeon: Unk Corinn Skiff, MD;  Location: [facility] ENDOSCOPY;  Service: Gastroenterology;  Laterality: N/A;   CYSTOSCOPY N/A 11/09/2015   Procedure: CYSTOSCOPY;  Surgeon: Mitzie BROCKS Ward, MD;  Location: [facility] ORS;  Service: Gynecology;  Laterality: N/A;   parotid stone      Removal   parotid stones Left    PRIMARY CLOSURE Bilateral 05/09/2022   Procedure: ADVANCED CLOSURE;  Surgeon: Lowery Estefana RAMAN, DO;  Location: [facility] ORS;  Service: Plastics;  Laterality: Bilateral;   REDUCTION MAMMAPLASTY Bilateral 02/2013   TONSILLECTOMY     TOTAL MASTECTOMY Bilateral 05/09/2022   Procedure: TOTAL MASTECTOMY;  Surgeon: Rodolph Romano, MD;  Location: [facility] ORS;  Service: General;  Laterality: Bilateral;    Family History  Problem Relation Age of Onset   Breast cancer Mother 44   Kidney cancer Father        dx in his mid 21s   Prostate cancer Maternal Uncle        dx in mid 6s   Stomach cancer Paternal Aunt        dx in mid 45s   Diabetes Maternal Grandmother    Heart disease Maternal Grandmother    Prostate cancer Maternal Grandfather    Heart disease Paternal Grandmother    Heart attack Paternal Grandfather    Prostate cancer Maternal Uncle        dx in mid 55s   Prostate cancer Maternal Uncle        dx in mid 92s   Colon cancer Maternal Uncle        dx in mid 79s   Colon cancer Cousin        paternal first cousin dx in his mid 84s   Colon cancer Cousin        paternal first cousin dx in his mid 72s    Social History:  reports that she has never smoked. She has  never used smokeless tobacco. She reports that she does not drink alcohol and does not  use drugs. She is from [place] .  She moved to [place]  at the end of 03/2015 to be with her family.   Allergies:  Allergies  Allergen Reactions   Cat Dander Other (See Comments)    Sneezing,watery eyes, throat closes   Zithromax [Azithromycin] Diarrhea   Clindamycin/Lincomycin Rash   Erythromycin Rash    Current Medications: Current Outpatient Medications  Medication Sig Dispense Refill   APRISO  0.375 g 24 hr capsule TAKE 1 CAPSULE BY MOUTH IN THE  MORNING AND AT BEDTIME 180 capsule 3   carbidopa -levodopa  (SINEMET  IR) 25-100 MG tablet Take 2 tablets by mouth 3 (three) times daily. Take as directed.     Cholecalciferol  (VITAMIN D3) 5000 units CAPS Take 5,000 Units by mouth daily.     COVID-19 mRNA bivalent vaccine, Pfizer, (PFIZER COVID-19 VAC BIVALENT) injection Inject into the muscle. 0.3 mL 0   cyanocobalamin (VITAMIN B12) 500 MCG tablet Take 500 mcg by mouth daily.     fluticasone  (FLONASE ) 50 MCG/ACT nasal spray Place 1-2 sprays into both nostrils daily as needed for allergies or rhinitis.     lidocaine  (LIDODERM ) 5 % 1 patch as needed.     Magnesium Glycinate 120 MG CAPS Take by mouth.     metoprolol  succinate (TOPROL -XL) 25 MG 24 hr tablet Take 25 mg by mouth daily.     tamoxifen  (NOLVADEX ) 20 MG tablet TAKE 1 TABLET BY MOUTH DAILY 90 tablet 3   HYDROcodone -acetaminophen  (NORCO/VICODIN) 5-325 MG tablet Take 1 tablet by mouth every 4 (four) hours as needed for moderate pain. (Patient not taking: Reported on 12/14/2023) 30 tablet 0   tretinoin (RETIN-A) 0.05 % cream 1 application  as needed. (Patient not taking: Reported on 12/14/2023)     No current facility-administered medications for this visit.     Performance status (ECOG):  0-1  Vitals Blood pressure 131/81, pulse 87, temperature 99 F (37.2 C), temperature source Tympanic, resp. rate 18, weight 158 lb 3.2 oz (71.8 kg), SpO2  100%.   Physical Exam Constitutional:      General: She is not in acute distress.    Appearance: She is not diaphoretic.  Eyes:     General: No scleral icterus. Cardiovascular:     Rate and Rhythm: Normal rate.     Heart sounds: No murmur heard. Pulmonary:     Effort: Pulmonary effort is normal. No respiratory distress.     Breath sounds: No wheezing.  Abdominal:     General: Bowel sounds are normal. There is no distension.     Palpations: Abdomen is soft.  Musculoskeletal:        General: Normal range of motion.     Cervical back: Normal range of motion.  Skin:    General: Skin is warm and dry.  Neurological:     Mental Status: She is alert and oriented to person, place, and time. Mental status is at baseline.     Cranial Nerves: No cranial nerve deficit.     Motor: No abnormal muscle tone.  Psychiatric:        Mood and Affect: Mood and affect normal.     RADIOGRAPHIC STUDIES: I have personally reviewed the radiological images as listed and agreed with the findings in the report. No results found.  Laboratory Studies.     Latest Ref Rng & Units 12/14/2023   10:28 AM 06/14/2023   10:35 AM 11/23/2022    8:14 AM  CBC  WBC 4.0 - 10.5 K/uL 6.5  7.9  7.8   Hemoglobin 12.0 - 15.0 g/dL 86.1  86.4  86.4   Hematocrit 36.0 - 46.0 % 41.9  42.4  41.6   Platelets 150 - 400 K/uL 185  193  196       Latest Ref Rng & Units 12/14/2023   10:28 AM 06/14/2023   10:35 AM 11/23/2022    8:14 AM  CMP  Glucose 70 - 99 mg/dL 885  891  893   BUN 8 - 23 mg/dL 12  12  9    Creatinine 0.44 - 1.00 mg/dL 9.41  9.40  9.54   Sodium 135 - 145 mmol/L 142  138  137   Potassium 3.5 - 5.1 mmol/L 3.8  3.6  3.5   Chloride 98 - 111 mmol/L 104  103  103   CO2 22 - 32 mmol/L 29  26  26    Calcium 8.9 - 10.3 mg/dL 89.3  9.9  9.5   Total Protein 6.5 - 8.1 g/dL 6.8  7.0  7.2   Total Bilirubin 0.0 - 1.2 mg/dL 0.4  0.6  0.4   Alkaline Phos 38 - 126 U/L 80  62  60   AST 15 - 41 U/L 80  153  96   ALT 0 - 44  U/L 30  24  13

## 2023-12-14 NOTE — Assessment & Plan Note (Signed)
Repeat calcium and PTH in 4 weeks

## 2023-12-14 NOTE — Assessment & Plan Note (Signed)
Chronically elevated LFT, monitor.  Previous liver biopsy on 11/05/2019 revealed benign hepatic parenchyma with moderate macrovesicular steatosis, mild portal inflammation with focal lobular activity, and focal ballooning degeneration. There was no evidence of malignancy. Follow up with GI

## 2023-12-14 NOTE — Assessment & Plan Note (Signed)
#   BRCA2 +  S/p TAH -BSO- 11/09/2015  Monitor CA125.

## 2023-12-15 LAB — CANCER ANTIGEN 19-9: CA 19-9: 43 U/mL — ABNORMAL HIGH (ref 0–35)

## 2023-12-15 LAB — CANCER ANTIGEN 15-3: CA 15-3: 14.1 U/mL (ref 0.0–25.0)

## 2023-12-15 LAB — CANCER ANTIGEN 27.29: CA 27.29: 22.6 U/mL (ref 0.0–38.6)

## 2023-12-26 NOTE — Progress Notes (Unsigned)
 [facility] Initial Consultation   Referring Provider Lenon Layman ORN, MD [address] [facility] [place],  [place] [address]  Primary Care Provider Lenon Layman ORN, MD  Patient Profile: Kelly Munoz is a 63 y.o. female who is seen in consultation in the [facility] at the request of Dr. Lenon for evaluation and management of the problem(s) noted below.  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  History of Present Illness Kelly Munoz is a 63 year old woman with a past medical history noteworthy for breast cancer with BRCA 2 gene mutation, Parkinsonism, HTN, osteopenia who is referred to the gastroenterology office for evaluation indeterminate colitis diagnosed in child, C. 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 with family history of pancreatic cancer in a first-degree relative and hepatic steatosis  Indeterminate colitis - Longstanding history of gastrointestinal symptoms including abdominal pain and altered bowel habits dating back to childhood - Reports being diagnosed with a form of ileitis as a child - Formal diagnosis of IBD was made at age 63 after colonoscopy disclosed colonic inflammation - Managed with formulations of oral mesalamine  throughout course with adequate control of disease - 5 colonoscopies between 2011 and 2023 have not shown evidence of endoscopic inflammation; mild histologic changes seen  - Never hospitalized for IBD or had IBD related surgeries - Has not been on steroids for  many years - Records document C. difficile infection in 2022  - Current regimen includes Apriso  1 tablet daily, increased to 2 tablets when traveling or anticipating dietary changes. - Bowel movements are soft and formed, occurring 2 to 3 times daily. - Occasional mucus and blood in stool, attributed to hemorrhoids. - Intermittent bloating and mild discomfort, improved with dietary control. - Clear dietary triggers identified; managed with diet and lifestyle modifications. - Denies extraintestinal manifestations of IBD including fevers, chills, joint pain, skin rashes or oral ulcers  Colorectal surveillance and family history - First colonoscopy at age 63 revealed polyps. - Surveillance colonoscopies performed every 2 to 3 years. - Family history includes colonic polyps in father, colon cancer in cousin and uncle, and Crohn's disease in another uncle.  Family history of pancreatic cancer/BRCA2 mutation - Carries a BRCA2 mutation. - Family history of pancreatic cancer in father. - Under surveillance for pancreatic cysts and elevated CA 19-9 levels (37-43) - Followed by [facility] gastroenterology with alternating upper EUS and MRI  Hepatic findings and fatty liver - Noted to have elevated transaminases and ASMA by Dr. Jinny [facility] gastroenterology) 2021 - ANA, alpha 1 antitrypsin, AMA, ceruloplasmin normal - Elevated ferritin 333; hemochromatosis gene panel negative 11/2022 - Liver biopsy 2021 showed hepatic steatosis - No history of alcohol use - Long-term tamoxifen  use following breast cancer treatment. - Recent concern for elevated calcium and variable liver enzymes - Labs show AST > ALT; Fib 4 for MASLD 4.89 - BMI 28.94  GI Review of Symptoms Significant for None. Otherwise negative.  General Review of Systems  Review of systems is significant for the pertinent positives and negatives as listed per the HPI.  Full ROS is otherwise negative.  Inflammatory Bowel Disease History  -  Longstanding history of gastrointestinal symptoms including abdominal pain and altered bowel habits dating back to childhood - Reports being diagnosed with a form of ileitis as a child - Formal diagnosis of IBD was made at age 63 after colonoscopy disclosed colonic inflammation - Managed with formulations of oral mesalamine  throughout course with adequate control of disease - 5 colonoscopies between 2011 and 2023 have not shown evidence of endoscopic inflammation; mild histologic changes seen  IBD Medication History Prednisone  Oral mesalamine    Past Medical History   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    Breast cancer (HCC)    Breast cancer, right (HCC) 06/08/2011   Crohn's disease (HCC)    Family history of breast cancer    Family history of colon cancer    Family history of prostate cancer    Heart murmur    History of IBS    History of kidney stones    Hypertension    Lymphocytosis 08/19/2019   Mixed hyperlipidemia    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Nonalcoholic steatohepatitis (NASH)    Parkinson disease (HCC) 2020   Parkinsonism (HCC)    Personal history of chemotherapy 2013   RIGHT lumpectomy   Personal history of radiation therapy 2013   RIGHT lumpectomy   PONV (postoperative nausea and vomiting)    nausea   Raynaud's disease    Skin cancer      Past Surgical History   Past Surgical History:  Procedure Laterality Date   ABDOMINAL HYSTERECTOMY Bilateral    oophorectomy   BREAST BIOPSY Right 2013   lumpectomy chem and rad   BREAST BIOPSY Left 06/27/2023   US  LT BREAST BX W LOC DEV 1ST LESION IMG BX SPEC US  GUIDE 06/27/2023 [facility]   BREAST LUMPECTOMY Right 2013   COLONOSCOPY WITH PROPOFOL  N/A 02/06/2017   Procedure: COLONOSCOPY WITH PROPOFOL ;  Surgeon: Gaylyn Gladis PENNER, MD;  Location: [facility] ENDOSCOPY;  Service: Endoscopy;  Laterality:  N/A;   COLONOSCOPY WITH PROPOFOL  N/A 08/02/2021   Procedure: COLONOSCOPY WITH PROPOFOL ;  Surgeon: Unk Corinn Skiff, MD;  Location: [facility] ENDOSCOPY;  Service: Gastroenterology;  Laterality: N/A;   CYSTOSCOPY N/A 11/09/2015   Procedure: CYSTOSCOPY;  Surgeon: Mitzie BROCKS Ward, MD;  Location: [facility] ORS;  Service: Gynecology;  Laterality: N/A;   parotid stone      Removal   parotid stones Left    PRIMARY CLOSURE Bilateral 05/09/2022   Procedure: ADVANCED CLOSURE;  Surgeon: Lowery Estefana RAMAN, DO;  Location: [facility] ORS;  Service: Plastics;  Laterality: Bilateral;   REDUCTION MAMMAPLASTY Bilateral 02/2013   TONSILLECTOMY     TOTAL MASTECTOMY Bilateral 05/09/2022   Procedure: TOTAL MASTECTOMY;  Surgeon: Rodolph Romano, MD;  Location: [facility] ORS;  Service: General;  Laterality: Bilateral;     Allergies and Medications   Allergies[1]    carbidopa -levodopa  clindamycin clobetasol cream cyanocobalamin fluticasone  lidocaine  Magnesium Glycinate Caps mesalamine  metoprolol  succinate Pfizer COVID-19 Vac Bivalent Susp tamoxifen  Vitamin D3 Caps   Family History   Family History  Problem Relation Age of Onset   Breast cancer Mother 72   Kidney cancer Father        dx in his mid 10s   Prostate cancer Maternal Uncle        dx in mid 31s   Stomach cancer Paternal Aunt        dx in mid 1s   Diabetes Maternal Grandmother  Heart disease Maternal Grandmother    Prostate cancer Maternal Grandfather    Heart disease Paternal Grandmother    Heart attack Paternal Grandfather    Prostate cancer Maternal Uncle        dx in mid 42s   Prostate cancer Maternal Uncle        dx in mid 78s   Colon cancer Maternal Uncle        dx in mid 99s   Colon cancer Cousin        paternal first cousin dx in his mid 69s   Colon cancer Cousin        paternal first cousin dx in his mid 44s     Social History   Social History[2] Kelly Munoz reports that she has never smoked. She has never used smokeless  tobacco. She reports that she does not drink alcohol and does not use drugs.  Vital Signs and Physical Examination   Vitals:   12/27/23 0858  BP: 136/70  Pulse: 94   Body mass index is 28.94 kg/m. Weight: 158 lb 4 oz (71.8 kg)  General: Well developed, well nourished, no acute distress Head: Normocephalic and atraumatic Eyes: Sclerae anicteric, EOMI Ears: Normal auditory acuity Lungs: Clear throughout to auscultation Heart: Regular rate and rhythm; No murmurs, rubs or bruits Abdomen: Soft, non tender and non distended. No masses, hepatosplenomegaly or hernias noted. Normal Bowel sounds Rectal: Deferred Musculoskeletal: Symmetrical with no gross deformities    Review of Data  The following data was reviewed at the time of this encounter:  Laboratory Studies      Latest Ref Rng & Units 12/14/2023   10:28 AM 06/14/2023   10:35 AM 11/23/2022    8:14 AM  CBC  WBC 4.0 - 10.5 K/uL 6.5  7.9  7.8   Hemoglobin 12.0 - 15.0 g/dL 86.1  86.4  86.4   Hematocrit 36.0 - 46.0 % 41.9  42.4  41.6   Platelets 150 - 400 K/uL 185  193  196     No results found for: LIPASE    Latest Ref Rng & Units 12/14/2023   10:28 AM 06/14/2023   10:35 AM 11/23/2022    8:14 AM  CMP  Glucose 70 - 99 mg/dL 885  891  893   BUN 8 - 23 mg/dL 12  12  9    Creatinine 0.44 - 1.00 mg/dL 9.41  9.40  9.54   Sodium 135 - 145 mmol/L 142  138  137   Potassium 3.5 - 5.1 mmol/L 3.8  3.6  3.5   Chloride 98 - 111 mmol/L 104  103  103   CO2 22 - 32 mmol/L 29  26  26    Calcium 8.9 - 10.3 mg/dL 89.3  9.9  9.5   Total Protein 6.5 - 8.1 g/dL 6.8  7.0  7.2   Total Bilirubin 0.0 - 1.2 mg/dL 0.4  0.6  0.4   Alkaline Phos 38 - 126 U/L 80  62  60   AST 15 - 41 U/L 80  153  96   ALT 0 - 44 U/L 30  24  13     Liver biopsy 11/05/2019 DIAGNOSIS:  A. LIVER, RIGHT LOBE; ULTRASOUND-GUIDED CORE NEEDLE BIOPSY:  - BENIGN HEPATIC PARENCHYMA WITH MODERATE MACROVESICULAR STEATOSIS, MILD  PORTAL INFLAMMATION WITH FOCAL LOBULAR  ACTIVITY, AND FOCAL BALLOONING  DEGENERATION.  - NEGATIVE FOR MALIGNANCY.     IBD Labs  Prebiologic Labs   Therapeutic Drug Monitoring @LASTLAB (TPMTACT)@ Thiopurine metabolite levels:  Date:                6-TGN       6-MMP  Biologic level and antibodies:  Fecal Calprotectin 04/2020   16  Imaging Studies  MRI/MRCP 08/29/2023 ([facility]) 1. No solid pancreatic lesion. Similar 6 mm pancreatic cystic lesion at the  pancreatic uncinate without worrisome imaging features. 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  2. Hepatic steatosis.   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 No evidence of pancreatic ductal dilation.   MRI abdomen/MRCP 09/16/2021 No evidence of pancreatic mass, abdominal metastatic disease, or other acute findings. Mild diffuse hepatic steatosis.  MRI/MRCP 02/2020 1. No evidence of suspicious mass or other abnormality of the abdomen. 2. Hepatic steatosis.  Abdominal ultrasound with elastography 02/28/2017 ULTRASOUND ABDOMEN: Diffuse hepatic steatosis and several tiny hepatic cysts. No evidence of hepatic mass.   No other significant abnormality identified. No evidence of gallstones or biliary ductal dilatation.   ULTRASOUND HEPATIC ELASTOGRAPHY:   Median hepatic shear wave velocity is calculated at 1.08 m/sec.   Corresponding Metavir fibrosis score is F0/F1.   Risk of fibrosis is Minimal.   Follow-up: None required  CT liver/abdomen 11/11/2016 Overall several of the tiny hypodense lesions in the liver are slightly larger or more conspicuous on today' s exam than on the prior exam, and conceivably be lesion posteriorly in the right hepatic lobe is new compared to the prior exam, although it may have simply been obscured by the more striking degree of hepatic steatosis on that exam. The pulmonary nodule is stable and benign and the mildly prominent right  gastric lymph nodes are chronically stable.    GI Procedures and Studies  EUS 04/25/2023 Cystic lesion in uncinate 8 process and tail of pancreas Liver cyst No pathologic lesions  Colonoscopy 08/02/2021 Normal colon and terminal ileum Path: Patchy mildly active colitis throughout all segments of colon with the exception of the rectum that was normal; dysplasia  Colonoscopy 02/06/2017 Normal colon and terminal ileum Left-sided diverticula Less than 1 mm polyp in rectum Path: Mild focal active ileitis, crypt irregularities in the rectosigmoid colon otherwise normal, benign polypoid mucosa from rectum  Colonoscopy 02/16/2015 Internal hemorrhoids Normal colon  Colonoscopy 04/19/2013 Internal hemorrhoids Normal colon  Colonoscopy 05/01/2009 Internal hemorrhoids Colonoscopy  Clinical Impression  It is my clinical impression that Kelly Munoz is a 63 y.o. 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  She reports having a longstanding history of gastrointestinal complaints dating back to a young age.  She recalls being diagnosed with ileitis as a child and formally diagnosed with a form of IBD/colitis by colonoscopy at 70.  She has been managed with oral mesalamine .  Her course has overall been mild without need for significant corticosteroids or hospitalization.  No history of extraintestinal manifestations or dysplasia.  Colonoscopies performed between 2011-2023 have shown endoscopic remission of disease with mild histologic changes consistent with underlying colitis and on rare occasions ileitis.  She may have a  very mild form of Crohn's disease.  At present symptoms appear well-controlled with  a nominal dose of Apriso  0.375 g daily.  I have recommended laboratory and fecal calprotectin assessment.    Kaleiyah reports a remote history of colon polyps.  She has a pertinent family history of colorectal cancer in multiple second-degree relatives-maternal uncle and paternal cousin.  She was previously advised a colonoscopy interval of 5 years.  In the setting of her indeterminate colitis and family history and interval between 3 to 5 years is reasonable.  Iman carries a BRCA2 mutation and has a family history of pancreatic cancer in her father at age 11.  She is being monitored by oncology and follows with [facility] hepatobiliary clinic with alternating EUS and MRI monitoring pancreatic cysts.  CA 19-9 has recently been elevated and is being monitored by oncology.  She also has a history of hepatic steatosis after noted to have elevated hepatic transaminases and ASMA in 2021.  Laboratory evaluation for chronic hepatitides was otherwise unremarkable.  Liver biopsy confirmed hepatic steatosis.  Etiology may be related to MASLD +/- long-term tamoxifen  use that she reports has been more than 10 years.  Current laboratory studies show a predominance of AST > ALT.she denies alcohol consumption.  We discussed updated laboratory studies today.  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 All questions were answered. Patient or caregiver is agreeable with the plan outlined above.    It was a pleasure to see Jora.  If you have any questions or concerns regarding this evaluation, do not hesitate to contact me.  Inocente Hausen, MD [facility]   I spent total of 60 minutes in both face-to-face (30 minutes interview) and non-face-to-face (30 minutes chart review, care coordination, documentation)  activities, excluding procedures performed, for the visit on the date of this encounter.      [1]  Allergies Allergen Reactions   Cat Dander Other (See Comments)    Sneezing,watery eyes, throat closes   Zithromax [Azithromycin] Diarrhea   Clindamycin/Lincomycin Rash   Erythromycin Rash  [2]  Social History Tobacco Use   Smoking status: Never   Smokeless tobacco: Never  Vaping Use   Vaping status: Never Used  Substance Use Topics   Alcohol use: No  Drug use: No

## 2023-12-27 ENCOUNTER — Ambulatory Visit: Admitting: Pediatrics

## 2023-12-27 ENCOUNTER — Other Ambulatory Visit (INDEPENDENT_AMBULATORY_CARE_PROVIDER_SITE_OTHER)

## 2023-12-27 ENCOUNTER — Encounter: Payer: Self-pay | Admitting: Pediatrics

## 2023-12-27 VITALS — BP 136/70 | HR 94 | Ht 62.0 in | Wt 158.2 lb

## 2023-12-27 DIAGNOSIS — Z860100 Personal history of colon polyps, unspecified: Secondary | ICD-10-CM | POA: Diagnosis not present

## 2023-12-27 DIAGNOSIS — Z800 Family history of malignant neoplasm of digestive organs: Secondary | ICD-10-CM | POA: Diagnosis not present

## 2023-12-27 DIAGNOSIS — K523 Indeterminate colitis: Secondary | ICD-10-CM

## 2023-12-27 DIAGNOSIS — K760 Fatty (change of) liver, not elsewhere classified: Secondary | ICD-10-CM

## 2023-12-27 DIAGNOSIS — Z1589 Genetic susceptibility to other disease: Secondary | ICD-10-CM | POA: Diagnosis not present

## 2023-12-27 DIAGNOSIS — Z1501 Genetic susceptibility to malignant neoplasm of breast: Secondary | ICD-10-CM

## 2023-12-27 DIAGNOSIS — Z1509 Genetic susceptibility to other malignant neoplasm: Secondary | ICD-10-CM | POA: Diagnosis not present

## 2023-12-27 DIAGNOSIS — Z15068 Genetic susceptibility to other malignant neoplasm of digestive system: Secondary | ICD-10-CM | POA: Diagnosis not present

## 2023-12-27 DIAGNOSIS — Z8619 Personal history of other infectious and parasitic diseases: Secondary | ICD-10-CM | POA: Diagnosis not present

## 2023-12-27 LAB — C-REACTIVE PROTEIN: CRP: 0.7 mg/dL — ABNORMAL LOW (ref 1.0–20.0)

## 2023-12-27 LAB — SEDIMENTATION RATE: Sed Rate: 17 mm/h (ref 0–30)

## 2023-12-27 LAB — CK: Total CK: 39 U/L (ref 17–177)

## 2023-12-27 MED ORDER — MESALAMINE ER 0.375 G PO CP24: 730.0000 mg | ORAL_CAPSULE | Freq: Every day | ORAL | 3 refills | Status: AC

## 2023-12-27 NOTE — Patient Instructions (Signed)
 Your provider has requested that you go to the basement level for lab work before leaving today. Press B on the elevator. The lab is located at the first door on the left as you exit the elevator.  Due to recent changes in healthcare laws, you may see the results of your imaging and laboratory studies on MyChart before your provider has had a chance to review them.  We understand that in some cases there may be results that are confusing or concerning to you. Not all laboratory results come back in the same time frame and the provider may be waiting for multiple results in order to interpret others.  Please give us  48 hours in order for your provider to thoroughly review all the results before contacting the office for clarification of your results.    Follow up in 6-12 months  Thank you for entrusting me with your care and for choosing [facility], Dr. Inocente Hausen  _______________________________________________________  If your blood pressure at your visit was 140/90 or greater, please contact your primary care physician to follow up on this.  _______________________________________________________  If you are age 60 or older, your body mass index should be between 23-30. Your Body mass index is 28.94 kg/m. If this is out of the aforementioned range listed, please consider follow up with your Primary Care Provider.  If you are age 6 or younger, your body mass index should be between 19-25. Your Body mass index is 28.94 kg/m. If this is out of the aformentioned range listed, please consider follow up with your Primary Care Provider.   ________________________________________________________  The [facility] providers would like to encourage you to use MYCHART to communicate with providers for non-urgent requests or questions.  Due to long hold times on the telephone, sending your provider a message by [facility] may be a faster and more efficient way to get a response.  Please allow  48 business hours for a response.  Please remember that this is for non-urgent requests.  _______________________________________________________  [facility] is using a team-based approach to care.  Your team is made up of your doctor and two to three APPS. Our APPS (Nurse Practitioners and Physician Assistants) work with your physician to ensure care continuity for you. They are fully qualified to address your health concerns and develop a treatment plan. They communicate directly with your gastroenterologist to care for you. Seeing the Advanced Practice Practitioners on your physician's team can help you by facilitating care more promptly, often allowing for earlier appointments, access to diagnostic testing, procedures, and other specialty referrals.

## 2023-12-29 LAB — ANTI-SMOOTH MUSCLE ANTIBODY, IGG: Actin (Smooth Muscle) Antibody (IGG): <20 U

## 2023-12-29 LAB — ANTI-NUCLEAR AB-TITER (ANA TITER): ANA Titer 1: 1:80 {titer} — ABNORMAL HIGH

## 2023-12-29 LAB — ANA: Anti Nuclear Antibody (ANA): POSITIVE — AB

## 2023-12-30 LAB — HEPATITIS C ANTIBODY: Hepatitis C Ab: NONREACTIVE

## 2023-12-30 LAB — HEPATITIS B SURFACE ANTIGEN: Hepatitis B Surface Ag: NONREACTIVE

## 2023-12-30 LAB — IGG: IgG (Immunoglobin G), Serum: 1218 mg/dL (ref 600–1540)

## 2023-12-30 LAB — HEPATITIS B CORE ANTIBODY, TOTAL: Hep B Core Total Ab: NONREACTIVE

## 2023-12-30 LAB — HEPATITIS B SURFACE ANTIBODY,QUALITATIVE: Hep B S Ab: REACTIVE — AB

## 2023-12-30 LAB — HEPATITIS A ANTIBODY, TOTAL: Hepatitis A AB,Total: REACTIVE — AB

## 2023-12-31 ENCOUNTER — Ambulatory Visit: Payer: Self-pay | Admitting: Pediatrics

## 2024-01-05 ENCOUNTER — Other Ambulatory Visit: Payer: Self-pay

## 2024-01-05 DIAGNOSIS — R7689 Other specified abnormal immunological findings in serum: Secondary | ICD-10-CM

## 2024-01-05 DIAGNOSIS — K760 Fatty (change of) liver, not elsewhere classified: Secondary | ICD-10-CM

## 2024-01-07 ENCOUNTER — Encounter: Payer: Self-pay | Admitting: Oncology

## 2024-01-08 ENCOUNTER — Other Ambulatory Visit (INDEPENDENT_AMBULATORY_CARE_PROVIDER_SITE_OTHER)

## 2024-01-08 ENCOUNTER — Other Ambulatory Visit: Payer: Self-pay

## 2024-01-08 DIAGNOSIS — Z800 Family history of malignant neoplasm of digestive organs: Secondary | ICD-10-CM

## 2024-01-08 DIAGNOSIS — K523 Indeterminate colitis: Secondary | ICD-10-CM | POA: Diagnosis not present

## 2024-01-08 DIAGNOSIS — M8580 Other specified disorders of bone density and structure, unspecified site: Secondary | ICD-10-CM

## 2024-01-09 ENCOUNTER — Ambulatory Visit
Admission: RE | Admit: 2024-01-09 | Discharge: 2024-01-09 | Disposition: A | Discharge status: Home / Self Care | Source: Ambulatory Visit | Attending: Pediatrics | Admitting: Pediatrics

## 2024-01-09 DIAGNOSIS — K760 Fatty (change of) liver, not elsewhere classified: Secondary | ICD-10-CM | POA: Diagnosis present

## 2024-01-09 DIAGNOSIS — R7689 Other specified abnormal immunological findings in serum: Secondary | ICD-10-CM | POA: Insufficient documentation

## 2024-01-11 LAB — CALPROTECTIN, FECAL: Calprotectin, Fecal: 5 ug/g (ref 0–120)

## 2024-01-13 ENCOUNTER — Ambulatory Visit: Payer: Self-pay | Admitting: Pediatrics

## 2024-01-15 ENCOUNTER — Inpatient Hospital Stay: Attending: Oncology

## 2024-01-15 DIAGNOSIS — C50911 Malignant neoplasm of unspecified site of right female breast: Secondary | ICD-10-CM | POA: Diagnosis present

## 2024-01-15 DIAGNOSIS — Z800 Family history of malignant neoplasm of digestive organs: Secondary | ICD-10-CM | POA: Diagnosis not present

## 2024-01-15 DIAGNOSIS — Z79810 Long term (current) use of selective estrogen receptor modulators (SERMs): Secondary | ICD-10-CM | POA: Diagnosis not present

## 2024-01-15 DIAGNOSIS — Z1731 Human epidermal growth factor receptor 2 positive status: Secondary | ICD-10-CM | POA: Insufficient documentation

## 2024-01-15 DIAGNOSIS — Z1721 Progesterone receptor positive status: Secondary | ICD-10-CM | POA: Diagnosis not present

## 2024-01-15 DIAGNOSIS — Z170 Estrogen receptor positive status [ER+]: Secondary | ICD-10-CM | POA: Insufficient documentation

## 2024-01-15 DIAGNOSIS — M8580 Other specified disorders of bone density and structure, unspecified site: Secondary | ICD-10-CM

## 2024-01-15 LAB — VITAMIN D 25 HYDROXY (VIT D DEFICIENCY, FRACTURES): Vit D, 25-Hydroxy: 57.6 ng/mL (ref 30–100)

## 2024-01-16 LAB — PTH, INTACT AND CALCIUM
Calcium, Total (PTH): 9.3 mg/dL (ref 8.7–10.3)
PTH: 101 pg/mL — ABNORMAL HIGH (ref 15–65)

## 2024-01-17 ENCOUNTER — Ambulatory Visit: Payer: Self-pay | Admitting: Oncology

## 2024-01-18 ENCOUNTER — Telehealth: Payer: Self-pay

## 2024-01-18 NOTE — Telephone Encounter (Signed)
 Endocrinology referral faxed to [facility] endocrinology. Re: Elevated PTH   Fax: [phone] Ph [phone]

## 2024-06-13 ENCOUNTER — Inpatient Hospital Stay

## 2024-06-13 ENCOUNTER — Inpatient Hospital Stay: Admitting: Oncology
